# Patient Record
Sex: Female | Born: 1950 | Race: Black or African American | Hispanic: No | Marital: Married | Smoking: Never smoker
Health system: Southern US, Community
[De-identification: ages and names within clinical notes are randomized; demographics above are authoritative.]

## PROBLEM LIST (undated history)

## (undated) DIAGNOSIS — N951 Menopausal and female climacteric states: Secondary | ICD-10-CM

## (undated) DIAGNOSIS — I10 Essential (primary) hypertension: Secondary | ICD-10-CM

## (undated) DIAGNOSIS — E119 Type 2 diabetes mellitus without complications: Secondary | ICD-10-CM

## (undated) DIAGNOSIS — M1990 Unspecified osteoarthritis, unspecified site: Secondary | ICD-10-CM

## (undated) DIAGNOSIS — E785 Hyperlipidemia, unspecified: Secondary | ICD-10-CM

## (undated) DIAGNOSIS — J302 Other seasonal allergic rhinitis: Secondary | ICD-10-CM

## (undated) DIAGNOSIS — E039 Hypothyroidism, unspecified: Secondary | ICD-10-CM

## (undated) DIAGNOSIS — C44111 Basal cell carcinoma of skin of unspecified eyelid, including canthus: Secondary | ICD-10-CM

## (undated) DIAGNOSIS — N879 Dysplasia of cervix uteri, unspecified: Secondary | ICD-10-CM

## (undated) DIAGNOSIS — D563 Thalassemia minor: Secondary | ICD-10-CM

## (undated) DIAGNOSIS — O039 Complete or unspecified spontaneous abortion without complication: Secondary | ICD-10-CM

## (undated) DIAGNOSIS — M810 Age-related osteoporosis without current pathological fracture: Secondary | ICD-10-CM

## (undated) HISTORY — DX: Type 2 diabetes mellitus without complications: E11.9

## (undated) HISTORY — PX: APPENDECTOMY: SHX54

## (undated) HISTORY — DX: Hyperlipidemia, unspecified: E78.5

## (undated) HISTORY — DX: Menopausal and female climacteric states: N95.1

## (undated) HISTORY — DX: Hypothyroidism, unspecified: E03.9

## (undated) HISTORY — PX: CHOLECYSTECTOMY: SHX55

## (undated) HISTORY — PX: TONSILLECTOMY: SHX5217

## (undated) HISTORY — PX: ABDOMINAL HYSTERECTOMY: SHX81

## (undated) HISTORY — DX: Essential (primary) hypertension: I10

## (undated) HISTORY — DX: Other seasonal allergic rhinitis: J30.2

## (undated) HISTORY — DX: Unspecified osteoarthritis, unspecified site: M19.90

## (undated) HISTORY — PX: OTHER SURGICAL HISTORY: SHX170

## (undated) HISTORY — DX: Thalassemia minor: D56.3

## (undated) HISTORY — PX: HX HYSTERECTOMY: SHX81

## (undated) HISTORY — PX: HX COLPOSCOPY: SHX161

## (undated) HISTORY — PX: HX BREAST BIOPSY: SHX20

## (undated) HISTORY — DX: Basal cell carcinoma of skin of unspecified eyelid, including canthus: C44.111

## (undated) HISTORY — PX: HX CRYOSURGERY OF CERVIX: 2100001332

## (undated) HISTORY — DX: Complete or unspecified spontaneous abortion without complication: O03.9

## (undated) HISTORY — DX: Dysplasia of cervix uteri, unspecified: N87.9

## (undated) HISTORY — PX: BASAL CELL CARCINOMA EXCISION: SHX1214

## (undated) HISTORY — DX: Age-related osteoporosis without current pathological fracture: M81.0

---

## 2003-06-25 LAB — HM DEXA SCAN: HM Dexa Scan: NORMAL

## 2008-02-26 ENCOUNTER — Ambulatory Visit (HOSPITAL_COMMUNITY): Payer: Self-pay | Admitting: Medical

## 2012-11-15 HISTORY — PX: COLONOSCOPY: SHX174

## 2013-05-04 LAB — HM COLONOSCOPY

## 2013-09-04 DIAGNOSIS — Z008 Encounter for other general examination: Secondary | ICD-10-CM | POA: Insufficient documentation

## 2013-09-04 HISTORY — DX: Encounter for other general examination: Z00.8

## 2013-09-21 ENCOUNTER — Ambulatory Visit (HOSPITAL_COMMUNITY)
Admission: RE | Admit: 2013-09-21 | Discharge: 2013-09-21 | Disposition: A | Discharge status: Home / Self Care | Payer: Self-pay | Source: Ambulatory Visit

## 2014-09-05 DIAGNOSIS — Z01419 Encounter for gynecological examination (general) (routine) without abnormal findings: Secondary | ICD-10-CM | POA: Insufficient documentation

## 2014-09-05 HISTORY — DX: Encounter for gynecological examination (general) (routine) without abnormal findings: Z01.419

## 2014-11-05 ENCOUNTER — Ambulatory Visit (HOSPITAL_COMMUNITY)
Admission: RE | Admit: 2014-11-05 | Discharge: 2014-11-05 | Disposition: A | Discharge status: Home / Self Care | Payer: Self-pay | Source: Ambulatory Visit

## 2015-09-11 DIAGNOSIS — N907 Vulvar cyst: Secondary | ICD-10-CM | POA: Insufficient documentation

## 2015-09-11 DIAGNOSIS — L720 Epidermal cyst: Secondary | ICD-10-CM | POA: Insufficient documentation

## 2015-09-11 DIAGNOSIS — D239 Other benign neoplasm of skin, unspecified: Secondary | ICD-10-CM | POA: Insufficient documentation

## 2015-09-11 DIAGNOSIS — D229 Melanocytic nevi, unspecified: Secondary | ICD-10-CM | POA: Insufficient documentation

## 2015-11-10 ENCOUNTER — Ambulatory Visit (HOSPITAL_COMMUNITY)
Admission: RE | Admit: 2015-11-10 | Discharge: 2015-11-10 | Disposition: A | Discharge status: Home / Self Care | Payer: Self-pay | Source: Ambulatory Visit

## 2016-12-23 ENCOUNTER — Ambulatory Visit (HOSPITAL_COMMUNITY)
Admission: RE | Admit: 2016-12-23 | Discharge: 2016-12-23 | Disposition: A | Discharge status: Home / Self Care | Payer: Self-pay | Source: Ambulatory Visit

## 2017-11-03 LAB — HM MAMMOGRAPHY

## 2018-01-04 ENCOUNTER — Ambulatory Visit (HOSPITAL_COMMUNITY)
Admission: RE | Admit: 2018-01-04 | Discharge: 2018-01-04 | Disposition: A | Discharge status: Home / Self Care | Payer: Self-pay | Source: Ambulatory Visit

## 2018-10-30 LAB — HM DIABETES EYE EXAM

## 2019-01-30 ENCOUNTER — Ambulatory Visit (HOSPITAL_COMMUNITY)
Admission: RE | Admit: 2019-01-30 | Discharge: 2019-01-30 | Disposition: A | Discharge status: Home / Self Care | Payer: Self-pay | Source: Ambulatory Visit

## 2019-08-09 LAB — BASIC METABOLIC PANEL
BUN: 18 (ref 4–21)
Creatinine: 0.7 (ref 0.5–1.1)

## 2019-08-09 LAB — HEMOGLOBIN A1C: Hemoglobin A1C: 6.1

## 2019-08-09 LAB — TSH: TSH: 3.64 (ref 0.41–5.90)

## 2019-09-12 ENCOUNTER — Other Ambulatory Visit: Payer: Self-pay

## 2019-09-12 ENCOUNTER — Ambulatory Visit: Payer: Medicare Other | Admitting: "Endocrinology

## 2019-09-12 ENCOUNTER — Encounter: Payer: Self-pay | Admitting: "Endocrinology

## 2019-09-12 VITALS — BP 138/79 | HR 88 | Temp 98.1°F | Ht 63.0 in | Wt 183.0 lb

## 2019-09-12 DIAGNOSIS — E1165 Type 2 diabetes mellitus with hyperglycemia: Secondary | ICD-10-CM

## 2019-09-12 MED ORDER — FREESTYLE LIBRE 14 DAY SENSOR MISC: 1.0000 | 2 refills | Status: DC

## 2019-09-12 MED ORDER — LOSARTAN POTASSIUM 25 MG PO TABS: 25.0000 mg | ORAL_TABLET | Freq: Every day | ORAL | 3 refills | Status: DC

## 2019-09-12 MED ORDER — FREESTYLE LIBRE 14 DAY READER DEVI: 1.0000 | Freq: Once | 0 refills | Status: AC

## 2019-09-12 NOTE — Progress Notes (Signed)
Endocrinology Consult Note       09/12/2019, 5:08 PM   Subjective:    Patient ID: Courtney Barry, female    DOB: 08/03/1951.  Courtney Barry is being seen in consultation for management of currently uncontrolled symptomatic diabetes requested by  Harland Dingwall L, NP-C.   Past Medical History:  Diagnosis Date  . Diabetes mellitus, type II ([place])   . Hyperlipidemia   . Hypothyroidism     Past Surgical History:  Procedure Laterality Date  . ABDOMINAL HYSTERECTOMY    . APPENDECTOMY    . CHOLECYSTECTOMY    . TONSILLECTOMY      Social History   Socioeconomic History  . Marital status: Married    Spouse name: Not on file  . Number of children: Not on file  . Years of education: Not on file  . Highest education level: Not on file  Occupational History  . Not on file  Social Needs  . Financial resource strain: Not on file  . Food insecurity    Worry: Not on file    Inability: Not on file  . Transportation needs    Medical: Not on file    Non-medical: Not on file  Tobacco Use  . Smoking status: Never Smoker  . Smokeless tobacco: Never Used  Substance and Sexual Activity  . Alcohol use: Never    Frequency: Never  . Drug use: Never  . Sexual activity: Not on file  Lifestyle  . Physical activity    Days per week: Not on file    Minutes per session: Not on file  . Stress: Not on file  Relationships  . Social Herbalist on phone: Not on file    Gets together: Not on file    Attends religious service: Not on file    Active member of club or organization: Not on file    Attends meetings of clubs or organizations: Not on file    Relationship status: Not on file  Other Topics Concern  . Not on file  Social History Narrative  . Not on file    Family History  Problem Relation Age of Onset  . Thyroid disease Mother   . Cancer Mother   . Hypertension Father   . Kidney disease  Father     Outpatient Encounter Medications as of 09/12/2019  Medication Sig  . atorvastatin (LIPITOR) 40 MG tablet Take 40 mg by mouth daily.  . insulin degludec (TRESIBA FLEXTOUCH) 100 UNIT/ML SOPN FlexTouch Pen Inject 25 Units into the skin at bedtime.  . insulin lispro (HUMALOG KWIKPEN) 100 UNIT/ML KwikPen Inject 5-11 Units into the skin 3 (three) times daily before meals.  Marland Kitchen levothyroxine (SYNTHROID) 112 MCG tablet Take 112 mcg by mouth daily before breakfast.  . loratadine (CLARITIN) 10 MG tablet Take 10 mg by mouth daily as needed for allergies.  . pioglitazone (ACTOS) 15 MG tablet Take 15 mg by mouth daily.  . Continuous Blood Gluc Receiver (FREESTYLE LIBRE 14 DAY READER) DEVI 1 each by Does not apply route once for 1 dose.  . Continuous Blood Gluc Sensor (FREESTYLE LIBRE 14 DAY SENSOR)  MISC Inject 1 each into the skin every 14 (fourteen) days. Use as directed.  Marland Kitchen losartan (COZAAR) 25 MG tablet Take 1 tablet (25 mg total) by mouth daily.   No facility-administered encounter medications on file as of 09/12/2019.     ALLERGIES: Allergies  Allergen Reactions  . Sulfa Antibiotics Other (See Comments)    VACCINATION STATUS:  There is no immunization history on file for this patient.  Diabetes She presents for her initial diabetic visit. She has type 2 diabetes mellitus. Onset time: She was diagnosed at approximate age of 7 years. Her disease course has been improving. There are no hypoglycemic associated symptoms. Pertinent negatives for hypoglycemia include no confusion, headaches, pallor or seizures. There are no diabetic associated symptoms. Pertinent negatives for diabetes include no chest pain, no polydipsia, no polyphagia and no polyuria. There are no hypoglycemic complications. Symptoms are improving. Diabetic complications include retinopathy. Risk factors for coronary artery disease include diabetes mellitus, dyslipidemia, hypertension, post-menopausal, sedentary lifestyle  and obesity. Current diabetic treatments: She is on Tresiba 25 units every morning, Humalog 8 units 3 times daily AC, pioglitazone 15 mg every other day. She is compliant with treatment most of the time. Her weight is fluctuating minimally. She is following a generally unhealthy diet. When asked about meal planning, she reported none. She has not had a previous visit with a dietitian. She participates in exercise intermittently. Her home blood glucose trend is decreasing steadily. (Her A1c on August 09, 2019 was 6.1% consistent with controlled profile.) An ACE inhibitor/angiotensin II receptor blocker is not being taken. Eye exam is current.  Hyperlipidemia This is a chronic problem. The current episode started more than 1 year ago. Exacerbating diseases include diabetes. Pertinent negatives include no chest pain, myalgias or shortness of breath. Current antihyperlipidemic treatment includes statins. Risk factors for coronary artery disease include diabetes mellitus, dyslipidemia, hypertension, a sedentary lifestyle and post-menopausal.  Thyroid Problem Presents for initial visit. Onset time: She was diagnosed with hypothyroidism in her 34s, currently taking Synthroid 112 mcg p.o. daily. Patient reports no cold intolerance, diarrhea, heat intolerance or palpitations. Her past medical history is significant for diabetes and hyperlipidemia.  Hypertension This is a chronic problem. Pertinent negatives include no chest pain, headaches, palpitations or shortness of breath. Risk factors for coronary artery disease include diabetes mellitus, dyslipidemia, family history, post-menopausal state and sedentary lifestyle. Hypertensive end-organ damage includes retinopathy. Identifiable causes of hypertension include a thyroid problem.     Review of Systems  Constitutional: Negative for chills, fever and unexpected weight change.  HENT: Negative for trouble swallowing and voice change.   Eyes: Negative for visual  disturbance.  Respiratory: Negative for cough, shortness of breath and wheezing.   Cardiovascular: Negative for chest pain, palpitations and leg swelling.  Gastrointestinal: Negative for diarrhea, nausea and vomiting.  Endocrine: Negative for cold intolerance, heat intolerance, polydipsia, polyphagia and polyuria.  Musculoskeletal: Negative for arthralgias and myalgias.  Skin: Negative for color change, pallor, rash and wound.  Neurological: Negative for seizures and headaches.  Psychiatric/Behavioral: Negative for confusion and suicidal ideas.    Objective:    BP 138/79   Pulse 88   Temp 98.1 F (36.7 C)   Wt 183 lb (83 kg)   Wt Readings from Last 3 Encounters:  09/12/19 183 lb (83 kg)     Physical Exam Constitutional:      Appearance: She is well-developed.  HENT:     Head: Normocephalic and atraumatic.  Neck:     Musculoskeletal:  Normal range of motion and neck supple.     Thyroid: No thyromegaly.     Trachea: No tracheal deviation.  Pulmonary:     Effort: Pulmonary effort is normal.  Abdominal:     Tenderness: There is no abdominal tenderness. There is no guarding.  Musculoskeletal: Normal range of motion.  Skin:    General: Skin is warm and dry.     Coloration: Skin is not pale.     Findings: No erythema or rash.  Neurological:     Mental Status: She is alert and oriented to person, place, and time.     Cranial Nerves: No cranial nerve deficit.     Coordination: Coordination normal.     Deep Tendon Reflexes: Reflexes are normal and symmetric.  Psychiatric:        Judgment: Judgment normal.      Recent Results (from the past 2160 hour(s))  Basic metabolic panel     Status: None   Collection Time: 08/09/19 12:00 AM  Result Value Ref Range   BUN 18 4 - 21   Creatinine 0.7 0.5 - 1.1  Hemoglobin A1c     Status: None   Collection Time: 08/09/19 12:00 AM  Result Value Ref Range   Hemoglobin A1C 6.1   TSH     Status: None   Collection Time: 08/09/19 12:00 AM   Result Value Ref Range   TSH 3.64 0.41 - 5.90    Comment:  ft4 ii 1.35      Assessment & Plan:   1. Uncontrolled type 2 diabetes mellitus with hyperglycemia ([place])  - Simon Kuenzel has currently controlled asymptomatic type 2 DM since  68 years of age,  with most recent A1c of 6.1 %. Recent labs reviewed. - I had a long discussion with her about the progressive nature of diabetes and the pathology behind its complications. -her diabetes is complicated by retinopathy obesity/sedentary life and she remains at a high risk for more acute and chronic complications which include CAD, CVA, CKD, retinopathy, and neuropathy. These are all discussed in detail with her.  - I have counseled her on diet  and weight management  by adopting a carbohydrate restricted/protein rich diet. Patient is encouraged to switch to  unprocessed or minimally processed     complex starch and increased protein intake (animal or plant source), fruits, and vegetables. -  she is advised to stick to a routine mealtimes to eat 3 meals  a day and avoid unnecessary snacks ( to snack only to correct hypoglycemia).   - she admits that there is a room for improvement in her food and drink choices. - Suggestion is made for her to avoid simple carbohydrates  from her diet including Cakes, Sweet Desserts, Ice Cream, Soda (diet and regular), Sweet Tea, Candies, Chips, Cookies, Store Bought Juices, Alcohol in Excess of  1-2 drinks a day, Artificial Sweeteners,  Coffee Creamer, and "Sugar-free" Products. This will help patient to have more stable blood glucose profile and potentially avoid unintended weight gain.  - she will be scheduled with Jearld Fenton, RDN, CDE for diabetes education.  - I have approached her with the following individualized plan to manage  her diabetes and patient agrees:   -She will benefit from simplified treatment regimen given her tight control of diabetes with A1c of 6.1%. -I advised her to switch her  Tyler Aas to bedtime 25 units, lower her Humalog to 5  units 3 times a day with meals  for pre-meal BG  readings of 90-150mg /dl, plus patient specific correction dose for unexpected hyperglycemia above 150mg /dl, associated with strict monitoring of glucose 4 times a day-before meals and at bedtime. - she is warned not to take insulin without proper monitoring per orders. - Adjustment parameters are given to her for hypo and hyperglycemia in writing. - she is encouraged to call clinic for blood glucose levels less than 70 or above 300 mg /dl. - she will benefit from a CGM - I discussed and prescribed the freestyle libre device for her -She has a chance to come off of Humalog, promises to do better on her diet. - she is advised to continue albuterol 15 mg po every  other day therapeutically suitable for patient . -She reports intolerance to Metformin, may benefit from low-dose glipizide, if necessary. - she will be considered for incretin therapy as appropriate next visit.  - Specific targets for  A1c;  LDL, HDL, Triglycerides, and  Waist Circumference were discussed with the patient.  2) Blood Pressure /Hypertension:  her blood pressure is  controlled to target.  I discussed initiated losartan 25 mg p.o. daily at breakfast.  3) Lipids/Hyperlipidemia: No recent lipid panel to review.  She is currently on atorvastatin 40 mg p.o. nightly.  She is advised to continue.   -  Side effects and precautions discussed with her.  4) hypothyroidism-longstanding diagnosis since at least from her 23s GERD The circumstances of her diagnosis are not available to review.  Patient denies any exposure to I-131 nor thyroidectomy. -Currently on Synthroid 112 mcg p.o. daily before breakfast.  - We discussed about the correct intake of her thyroid hormone, on empty stomach at fasting, with water, separated by at least 30 minutes from breakfast and other medications,  and separated by more than 4 hours from calcium, iron,  multivitamins, acid reflux medications (PPIs). -Patient is made aware of the fact that thyroid hormone replacement is needed for life, dose to be adjusted by periodic monitoring of thyroid function tests.   5)  Weight/Diet: Her BMI is 32- clearly complicating her diabetes care.   she is  a candidate for weight loss. I discussed with her the fact that loss of 5 - 10% of her  current body weight will have the most impact on her diabetes management.  Exercise, and detailed carbohydrates information provided  -  detailed on discharge instructions.  6) Chronic Care/Health Maintenance:  -she  is on ACEI/ARB and Statin medications and  is encouraged to initiate and continue to follow up with Ophthalmology, Dentist,  Podiatrist at least yearly or according to recommendations, and advised to  stay away from smoking. I have recommended yearly flu vaccine and pneumonia vaccine at least every 5 years; moderate intensity exercise for up to 150 minutes weekly; and  sleep for at least 7 hours a day.  - she is  advised to maintain close follow up with Girtha Rm, NP-C for primary care needs, as well as her other providers for optimal and coordinated care.  - Time spent with the patient: 60 minutes, of which >50% was spent in obtaining information about her symptoms, reviewing her previous labs/studies, evaluations, and treatments, counseling her about her currently controlled asymptomatic type 2 diabetes, hypothyroidism, hyperlipidemia, hypertension, and developing plans for long term treatment based on the latest standards of care/guidelines.  Please refer to " Patient Self Inventory" in the Media  tab for reviewed elements of pertinent patient history.  Courtney Barry participated in the discussions, expressed understanding, and  voiced agreement with the above plans.  All questions were answered to her satisfaction. she is encouraged to contact clinic should she have any questions or concerns prior to her return  visit.  Follow up plan: - Return in about 10 days (around 09/22/2019), or phone, for Follow up with Meter and Logs Only - no Labs.  Glade Lloyd, MD [facility] [address] [place], [place] [address] Phone: [phone]  Fax: [phone]    09/12/2019, 5:08 PM  This note was partially dictated with voice recognition software. Similar sounding words can be transcribed inadequately or may not  be corrected upon review.

## 2019-09-12 NOTE — Patient Instructions (Signed)

## 2019-09-13 ENCOUNTER — Telehealth: Payer: Self-pay | Admitting: "Endocrinology

## 2019-09-13 NOTE — Telephone Encounter (Signed)
Courtney Barry with [facility] left a VM that the freestyle libre and sensor that was sent to them needs a DX code. She can be reached at [phone]

## 2019-09-14 NOTE — Telephone Encounter (Signed)
E11.65 is the code

## 2019-09-17 ENCOUNTER — Telehealth: Payer: Self-pay | Admitting: "Endocrinology

## 2019-09-17 NOTE — Telephone Encounter (Signed)
Pt. Notified.

## 2019-09-17 NOTE — Telephone Encounter (Signed)
Pt notified that she can skip the morning insulin since she will not eat but that the lunch reading will be a little higher than usual

## 2019-09-17 NOTE — Telephone Encounter (Signed)
Pt said she has a fasting physical with her PMD on Wednesday at 10 am. She wants to know is it okay to take her insulin without eating, because she normally takes it when she eats breakfast.

## 2019-09-19 ENCOUNTER — Encounter: Payer: Self-pay | Admitting: Family Medicine

## 2019-09-19 ENCOUNTER — Other Ambulatory Visit: Payer: Self-pay

## 2019-09-19 ENCOUNTER — Ambulatory Visit: Payer: Medicare Other | Admitting: Family Medicine

## 2019-09-19 VITALS — BP 124/62 | HR 84 | Temp 98.0°F | Ht 65.0 in | Wt 182.8 lb

## 2019-09-19 DIAGNOSIS — I152 Hypertension secondary to endocrine disorders: Secondary | ICD-10-CM

## 2019-09-19 DIAGNOSIS — E785 Hyperlipidemia, unspecified: Secondary | ICD-10-CM

## 2019-09-19 DIAGNOSIS — E1169 Type 2 diabetes mellitus with other specified complication: Secondary | ICD-10-CM

## 2019-09-19 DIAGNOSIS — E119 Type 2 diabetes mellitus without complications: Secondary | ICD-10-CM | POA: Diagnosis not present

## 2019-09-19 DIAGNOSIS — Z841 Family history of disorders of kidney and ureter: Secondary | ICD-10-CM

## 2019-09-19 DIAGNOSIS — N951 Menopausal and female climacteric states: Secondary | ICD-10-CM

## 2019-09-19 DIAGNOSIS — J302 Other seasonal allergic rhinitis: Secondary | ICD-10-CM | POA: Diagnosis not present

## 2019-09-19 DIAGNOSIS — E039 Hypothyroidism, unspecified: Secondary | ICD-10-CM

## 2019-09-19 NOTE — Progress Notes (Signed)
Subjective:    Patient ID: Courtney Barry, female    DOB: [birth year], 68 y.o.   MRN: [id number]  HPI Chief Complaint  Patient presents with  . new pt    new pt, get established , seeing endo diabetes. a1c 6.1 on 08/09/19, hyperthyroid    She is new to the practice and here to establish care. Moved here from [place] with her husband in June 2020 to be closer to her son and grandchildren. Previous medical care: in [place] at Dr. Sherral Hammers, endocrinologist.  She also had a PCP and OB/GYN there. She would like to establish with a gynecologist here.  She has already established with endocrinology. Has been using topical estradiol.  Last CPE: January 2020  Other providers: Dr. Dorris Fetch- endocrinologist  OB/GYN- in [place]   States her father and sister both died of kidney disease. States her sister had congential kidney disease and was on dialysis at the end. Sister had a kidney transplant.  States her father had the same kidney disease but this did not affect him until his late 31s or early 37s and he passed away in his mid 19s.  States she has never seen a nephrologist or had an ultrasound of her kidneys.  She is concerned that she may have the same condition.   DM type 2 - managed by Dr. Dorris Fetch. States she was diagnosed 16 years ago. Started on insulin September 2019.  Last Hgb A1c 6.1% and well controlled.  She had side effects from Metformin so she was taken off of this.  Taking Actos every other day. States they are weaning her off of this.   Hypothyroidism is also managed by Dr. Dorris Fetch.   HTN- started on medication in the past couple of weeks.  States her blood pressures were always fine until a few months ago.  She did bring in readings from home and blood pressures have been elevated since the summer.  States Dr. Dorris Fetch started her on losartan and she is doing fine with this.  HL-states she is unsure what her lipids have been in the past but she has been on atorvastatin 40 mg because of her  diabetes mainly.  States her cholesterol has not been checked in months and she would like to have this done today.  She denies any arthralgias or myalgias or any other side effects from the atorvastatin.  Seasonal allergies- spring and fall.  Takes Claritin as needed  Reports a history of trigger finger on her right hand.  States she has been to a hand specialist and has had several injections.  States this is still bothersome.  States she has been wearing a glove at home and that is helping.   Reports chronic issue of left ankle pain and swelling as well as left knee pain.  States she saw a foot specialist in [place]. Wearing compression stockings, special orthotics in her shoes. States she was prescribed a compound topical medication that she uses on her ankle and knee and this helps.  LMP: 2013. Total hysterectomy  Normal DEXA in the past  Mammogram due in December.   Gained 20 lbs since March with insulin.   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 [employer].   Denies smoking, drinking alcohol, drug use  Diet: Reports a fairly healthy diet Excerise: walks for exercise but decreased activity over the past few months due to ankle and knee pain.  Immunizations: States she  received her flu shot in September 2020  Health maintenance:  Mammogram: due in December per patient Colonoscopy:  Approximately 10 years ago   Reviewed allergies, medications, past medical, surgical, family, and social history.    Review of Systems Pertinent positives and negatives in the history of present illness.     Objective:   Physical Exam BP 124/62   Pulse 84   Temp 98 F (36.7 C)   Ht 5\' 5"  (1.651 m)   Wt 182 lb 12.8 oz (82.9 kg)   BMI 30.42 kg/m   Alert and in no distress. . Cardiac exam shows a regular sinus rhythm without murmurs or gallops. Lungs are clear to auscultation.       Assessment & Plan:   Diabetes mellitus without complication ([place]) - managed by Dr. Dorris Fetch.   Hyperlipidemia associated with type 2 diabetes mellitus ([place]) - Plan: Lipid panel -continue on statin. States she needs lipids checked, has been months since last one. Follow up pending lipid panel.   Hypertension due to endocrine disorder- started on losartan recently. BP normal today. Counseling on low sodium diet   Seasonal allergies- takes Claritin as needed   Family history of kidney disease in sister - Plan: Ambulatory referral to Nephrology - I do not have medical records from her previous PCP nor do I have lab results.  We discussed checking renal function today and either sending her for renal ultrasound or referral to nephrology and she would prefer to see a nephrologist.  Family history of kidney disease in father - Plan: Ambulatory referral to Nephrology -See above note  Hypothyroidism, unspecified type- managed by Dr. Dorris Fetch  Menopausal vaginal dryness - Plan: Ambulatory referral to Gynecology -continue on topical estradiol

## 2019-09-19 NOTE — Patient Instructions (Addendum)
It was a pleasure meeting you today.   Try to increase your physical activity as discussed. It is recommended that you get at least 150 minutes of some type of physical activity per week.    You will hear from [facility] in the next few days.  You should also hear from [facility] but this may take a couple of weeks.    Here is a list of Eye Doctors that you call and schedule an appointment with.   [facility] Address: [place], [place], [place] [address] Phone: [phone]   [facility] Address: [address] [place], [place], [place] [address] Phone: [phone]  Dr. Katy Fitch Address: [address] [place], [place] [address] Phone: [phone]  [facility] [address] [place], [place]  [address] Telephone: [phone]

## 2019-09-20 LAB — LIPID PANEL
Chol/HDL Ratio: 2.4 ratio (ref 0.0–4.4)
Cholesterol, Total: 107 mg/dL (ref 100–199)
HDL: 45 mg/dL (ref >39)
LDL Chol Calc (NIH): 50 mg/dL (ref 0–99)
Triglycerides: 49 mg/dL (ref 0–149)
VLDL Cholesterol Cal: 12 mg/dL (ref 5–40)

## 2019-09-24 ENCOUNTER — Encounter: Payer: Self-pay | Admitting: "Endocrinology

## 2019-09-24 ENCOUNTER — Other Ambulatory Visit: Payer: Self-pay

## 2019-09-24 ENCOUNTER — Ambulatory Visit (INDEPENDENT_AMBULATORY_CARE_PROVIDER_SITE_OTHER): Payer: Medicare Other | Admitting: "Endocrinology

## 2019-09-24 DIAGNOSIS — E039 Hypothyroidism, unspecified: Secondary | ICD-10-CM

## 2019-09-24 DIAGNOSIS — E1165 Type 2 diabetes mellitus with hyperglycemia: Secondary | ICD-10-CM | POA: Diagnosis not present

## 2019-09-24 DIAGNOSIS — I10 Essential (primary) hypertension: Secondary | ICD-10-CM

## 2019-09-24 DIAGNOSIS — E782 Mixed hyperlipidemia: Secondary | ICD-10-CM

## 2019-09-24 MED ORDER — TRESIBA FLEXTOUCH 100 UNIT/ML ~~LOC~~ SOPN: 25.0000 [IU] | PEN_INJECTOR | Freq: Every day | SUBCUTANEOUS | 2 refills | Status: DC

## 2019-09-24 MED ORDER — LEVOTHYROXINE SODIUM 112 MCG PO TABS: 112.0000 ug | ORAL_TABLET | Freq: Every day | ORAL | 3 refills | Status: DC

## 2019-09-24 MED ORDER — INSULIN LISPRO (1 UNIT DIAL) 100 UNIT/ML (KWIKPEN): 5.0000 [IU] | PEN_INJECTOR | Freq: Three times a day (TID) | SUBCUTANEOUS | 2 refills | Status: DC

## 2019-09-24 NOTE — Progress Notes (Signed)
09/24/2019, 4:42 PM                                                    Endocrinology Telehealth Visit Follow up Note -During COVID -19 Pandemic  This visit type was conducted due to national recommendations for restrictions regarding the COVID-19 Pandemic  in an effort to limit this patient's exposure and mitigate transmission of the corona virus.  Due to her co-morbid illnesses, Courtney Barry is at  moderate to high risk for complications without adequate follow up.  This format is felt to be most appropriate for her at this time.  I connected with this patient on 09/24/2019   by telephone and verified that I am speaking with the correct person using two identifiers. Courtney Barry, 04-09-1951. she has verbally consented to this visit. All issues noted in this document were discussed and addressed. The format was not optimal for physical exam.    Subjective:    Patient ID: Courtney Barry, female    DOB: 07/19/1951.  Courtney Barry is being engaged in telehealth via telephone after she was seen in consultation for management of currently uncontrolled symptomatic diabetes requested by  Harland Dingwall L, NP-C.   Past Medical History:  Diagnosis Date  . Diabetes mellitus, type II ([place])   . Hyperlipidemia   . Hypothyroidism   . Menopausal vaginal dryness     Past Surgical History:  Procedure Laterality Date  . ABDOMINAL HYSTERECTOMY    . APPENDECTOMY    . CHOLECYSTECTOMY    . TONSILLECTOMY      Social History   Socioeconomic History  . Marital status: Married    Spouse name: Not on file  . Number of children: Not on file  . Years of education: Not on file  . Highest education level: Not on file  Occupational History  . Not on file  Social Needs  . Financial resource strain: Not on file  . Food insecurity    Worry: Not on file    Inability: Not on file  . Transportation needs    Medical: Not on file     Non-medical: Not on file  Tobacco Use  . Smoking status: Never Smoker  . Smokeless tobacco: Never Used  Substance and Sexual Activity  . Alcohol use: Never    Frequency: Never  . Drug use: Never  . Sexual activity: Not on file  Lifestyle  . Physical activity    Days per week: Not on file    Minutes per session: Not on file  . Stress: Not on file  Relationships  . Social Herbalist on phone: Not on file    Gets together: Not on file    Attends religious service: Not on file    Active member of club or organization: Not on file    Attends meetings of clubs or organizations: Not on file    Relationship status: Not on file  Other Topics Concern  . Not on file  Social History Narrative  . Not on file    Family History  Problem Relation Age of Onset  . Thyroid disease Mother   . Cancer Mother   . Pancreatic cancer Mother   . Hypertension Father   . Kidney disease Father   . Thyroid cancer Sister        died at 62    Outpatient Encounter Medications as of 09/24/2019  Medication Sig  . Ascorbic Acid (VITAMIN C PO) Take by mouth.  Marland Kitchen atorvastatin (LIPITOR) 40 MG tablet Take 40 mg by mouth daily.  . Continuous Blood Gluc Sensor (FREESTYLE LIBRE 14 DAY SENSOR) MISC Inject 1 each into the skin every 14 (fourteen) days. Use as directed.  . insulin degludec (TRESIBA FLEXTOUCH) 100 UNIT/ML SOPN FlexTouch Pen Inject 0.25 mLs (25 Units total) into the skin at bedtime.  . insulin lispro (HUMALOG KWIKPEN) 100 UNIT/ML KwikPen Inject 0.05-0.11 mLs (5-11 Units total) into the skin 3 (three) times daily before meals.  Marland Kitchen levothyroxine (SYNTHROID) 112 MCG tablet Take 1 tablet (112 mcg total) by mouth daily before breakfast.  . loratadine (CLARITIN) 10 MG tablet Take 10 mg by mouth daily as needed for allergies.  Marland Kitchen losartan (COZAAR) 25 MG tablet Take 1 tablet (25 mg total) by mouth daily.  . pioglitazone (ACTOS) 15 MG tablet Take 15 mg by mouth daily.  . Polyvinyl Alcohol-Povidone  (REFRESH OP) Apply to eye.  . [DISCONTINUED] insulin degludec (TRESIBA FLEXTOUCH) 100 UNIT/ML SOPN FlexTouch Pen Inject 25 Units into the skin at bedtime.  . [DISCONTINUED] insulin lispro (HUMALOG KWIKPEN) 100 UNIT/ML KwikPen Inject 5-11 Units into the skin 3 (three) times daily before meals.  . [DISCONTINUED] levothyroxine (SYNTHROID) 112 MCG tablet Take 112 mcg by mouth daily before breakfast.   No facility-administered encounter medications on file as of 09/24/2019.     ALLERGIES: Allergies  Allergen Reactions  . Sulfa Antibiotics Other (See Comments)    VACCINATION STATUS: Immunization History  Administered Date(s) Administered  . Influenza-Unspecified 08/09/2019    Diabetes She presents for her follow-up diabetic visit. She has type 2 diabetes mellitus. Onset time: She was diagnosed at approximate age of 6 years. Her disease course has been improving. There are no hypoglycemic associated symptoms. Pertinent negatives for hypoglycemia include no confusion, headaches, pallor or seizures. There are no diabetic associated symptoms. Pertinent negatives for diabetes include no chest pain, no polydipsia, no polyphagia and no polyuria. There are no hypoglycemic complications. Symptoms are improving. Diabetic complications include retinopathy. Risk factors for coronary artery disease include diabetes mellitus, dyslipidemia, hypertension, post-menopausal, sedentary lifestyle and obesity. Current diabetic treatments: She is on Tresiba 25 units every morning, Humalog 8 units 3 times daily AC, pioglitazone 15 mg every other day. She is compliant with treatment most of the time. Her weight is fluctuating minimally. She is following a generally unhealthy diet. When asked about meal planning, she reported none. She has not had a previous visit with a dietitian. She participates in exercise intermittently. Her home blood glucose trend is decreasing steadily. Her breakfast blood glucose range is generally  110-130 mg/dl. Her lunch blood glucose range is generally 110-130 mg/dl. Her dinner blood glucose range is generally 110-130 mg/dl. Her bedtime blood glucose range is generally 130-140 mg/dl. Her overall blood glucose range is 110-130 mg/dl. (Her A1c on August 09, 2019 was 6.1% consistent with controlled profile.) An ACE inhibitor/angiotensin II receptor blocker is not being taken. Eye exam is current.  Hyperlipidemia This is a chronic problem. The current episode started  more than 1 year ago. Exacerbating diseases include diabetes. Pertinent negatives include no chest pain, myalgias or shortness of breath. Current antihyperlipidemic treatment includes statins. Risk factors for coronary artery disease include diabetes mellitus, dyslipidemia, hypertension, a sedentary lifestyle and post-menopausal.  Thyroid Problem Presents for initial visit. Onset time: She was diagnosed with hypothyroidism in her 17s, currently taking Synthroid 112 mcg p.o. daily. Patient reports no cold intolerance, diarrhea, heat intolerance or palpitations. Her past medical history is significant for diabetes and hyperlipidemia.  Hypertension This is a chronic problem. Pertinent negatives include no chest pain, headaches, palpitations or shortness of breath. Risk factors for coronary artery disease include diabetes mellitus, dyslipidemia, family history, post-menopausal state and sedentary lifestyle. Hypertensive end-organ damage includes retinopathy. Identifiable causes of hypertension include a thyroid problem.   Review of systems: Limited as above.    Objective:    There were no vitals taken for this visit.  Wt Readings from Last 3 Encounters:  09/19/19 182 lb 12.8 oz (82.9 kg)  09/12/19 183 lb (83 kg)       Recent Results (from the past 2160 hour(s))  Basic metabolic panel     Status: None   Collection Time: 08/09/19 12:00 AM  Result Value Ref Range   BUN 18 4 - 21   Creatinine 0.7 0.5 - 1.1  Hemoglobin A1c      Status: None   Collection Time: 08/09/19 12:00 AM  Result Value Ref Range   Hemoglobin A1C 6.1   TSH     Status: None   Collection Time: 08/09/19 12:00 AM  Result Value Ref Range   TSH 3.64 0.41 - 5.90    Comment:  ft4 ii 1.35  Lipid panel     Status: None   Collection Time: 09/19/19 11:33 AM  Result Value Ref Range   Cholesterol, Total 107 100 - 199 mg/dL   Triglycerides 49 0 - 149 mg/dL   HDL 45 >39 mg/dL   VLDL Cholesterol Cal 12 5 - 40 mg/dL   LDL Chol Calc (NIH) 50 0 - 99 mg/dL   Chol/HDL Ratio 2.4 0.0 - 4.4 ratio    Comment:                                   T. Chol/HDL Ratio                                             Men  Women                               1/2 Avg.Risk  3.4    3.3                                   Avg.Risk  5.0    4.4                                2X Avg.Risk  9.6    7.1  3X Avg.Risk 23.4   11.0       Assessment & Plan:   1. Uncontrolled type 2 diabetes mellitus with hyperglycemia ([place])  - Marbella Digenova has currently controlled asymptomatic type 2 DM since  68 years of age,  with most recent A1c of 6.1 %. Recent labs reviewed. -She is reporting 100% on target glycemic profile both fasting and postprandial. - I had a long discussion with her about the progressive nature of diabetes and the pathology behind its complications. -her diabetes is complicated by retinopathy obesity/sedentary life and she remains at a high risk for more acute and chronic complications which include CAD, CVA, CKD, retinopathy, and neuropathy. These are all discussed in detail with her.  - I have counseled her on diet  and weight management  by adopting a carbohydrate restricted/protein rich diet. Patient is encouraged to switch to  unprocessed or minimally processed     complex starch and increased protein intake (animal or plant source), fruits, and vegetables. -  she is advised to stick to a routine mealtimes to eat 3 meals  a day and avoid  unnecessary snacks ( to snack only to correct hypoglycemia).   - she  admits there is a room for improvement in her diet and drink choices. -  Suggestion is made for her to avoid simple carbohydrates  from her diet including Cakes, Sweet Desserts / Pastries, Ice Cream, Soda (diet and regular), Sweet Tea, Candies, Chips, Cookies, Sweet Pastries,  Store Bought Juices, Alcohol in Excess of  1-2 drinks a day, Artificial Sweeteners, Coffee Creamer, and "Sugar-free" Products. This will help patient to have stable blood glucose profile and potentially avoid unintended weight gain.   - she will be scheduled with Jearld Fenton, RDN, CDE for diabetes education.  - I have approached her with the following individualized plan to manage  her diabetes and patient agrees:   -She will benefit from simplified treatment regimen given her tight control of diabetes with A1c of 6.1%. -She is advised to continue  Tresiba 25 units at bedtime, Humalog 5  units 3 times a day with meals  for pre-meal BG readings of 90-150mg /dl, plus patient specific correction dose for unexpected hyperglycemia above 150mg /dl, associated with strict monitoring of glucose 4 times a day-before meals and at bedtime. - she is warned not to take insulin without proper monitoring per orders. - Adjustment parameters are given to her for hypo and hyperglycemia in writing. - she is encouraged to call clinic for blood glucose levels less than 70 or above 300 mg /dl. - she will benefit from a CGM - I discussed and prescribed the freestyle libre device for her -She has a chance to come off of Humalog, promises to do better on her diet. - she is advised to continue albuterol 15 mg po every  other day therapeutically suitable for patient . -She reports intolerance to Metformin, may benefit from low-dose glipizide, if necessary. -She is advised to continue pioglitazone 15 mg p.o. daily with breakfast, will be discontinued after she finishes her current  supplies. - she will be considered for incretin therapy as appropriate next visit.  - Specific targets for  A1c;  LDL, HDL, Triglycerides, and  Waist Circumference were discussed with the patient.  2) Blood Pressure /Hypertension:  her blood pressure is  controlled to target.  I discussed initiated losartan 25 mg p.o. daily at breakfast.  3) Lipids/Hyperlipidemia: No recent lipid panel to review.  She is currently on atorvastatin 40 mg  p.o. nightly.  She is advised to continue.   -  Side effects and precautions discussed with her.  4) hypothyroidism-longstanding diagnosis since at least from her 79s GERD The circumstances of her diagnosis are not available to review.  Patient denies any exposure to I-131 nor thyroidectomy. -Currently on Synthroid 112 mcg p.o. daily before breakfast.  - We discussed about the correct intake of her thyroid hormone, on empty stomach at fasting, with water, separated by at least 30 minutes from breakfast and other medications,  and separated by more than 4 hours from calcium, iron, multivitamins, acid reflux medications (PPIs). -Patient is made aware of the fact that thyroid hormone replacement is needed for life, dose to be adjusted by periodic monitoring of thyroid function tests.  5)  Weight/Diet: Her BMI is 32- clearly complicating her diabetes care.   she is  a candidate for weight loss. I discussed with her the fact that loss of 5 - 10% of her  current body weight will have the most impact on her diabetes management.  Exercise, and detailed carbohydrates information provided  -  detailed on discharge instructions.  6) Chronic Care/Health Maintenance:  -she  is on ACEI/ARB and Statin medications and  is encouraged to initiate and continue to follow up with Ophthalmology, Dentist,  Podiatrist at least yearly or according to recommendations, and advised to  stay away from smoking. I have recommended yearly flu vaccine and pneumonia vaccine at least every 5 years;  moderate intensity exercise for up to 150 minutes weekly; and  sleep for at least 7 hours a day.  - she is  advised to maintain close follow up with Girtha Rm, NP-C for primary care needs, as well as her other providers for optimal and coordinated care.  - Patient Care Time Today:  25 min, of which >50% was spent in  counseling and the rest reviewing her  current and  previous labs/studies, previous treatments, her blood glucose readings, and medications' doses and developing a plan for long-term care based on the latest recommendations for standards of care.   Courtney Barry participated in the discussions, expressed understanding, and voiced agreement with the above plans.  All questions were answered to her satisfaction. she is encouraged to contact clinic should she have any questions or concerns prior to her return visit.   Follow up plan: - Return in about 3 months (around 12/25/2019) for Bring Meter and Logs- A1c in Office, Include 8 log sheets.  Glade Lloyd, MD [facility] [address] [place], [place] [address] Phone: [phone]  Fax: [phone]    09/24/2019, 4:42 PM  This note was partially dictated with voice recognition software. Similar sounding words can be transcribed inadequately or may not  be corrected upon review.

## 2019-09-25 ENCOUNTER — Encounter: Payer: Self-pay | Admitting: Family Medicine

## 2019-09-28 ENCOUNTER — Other Ambulatory Visit: Payer: Self-pay

## 2019-10-01 ENCOUNTER — Ambulatory Visit: Payer: Self-pay | Admitting: Family Medicine

## 2019-10-01 ENCOUNTER — Other Ambulatory Visit: Payer: Self-pay

## 2019-10-01 ENCOUNTER — Ambulatory Visit: Payer: Medicare Other | Admitting: Obstetrics & Gynecology

## 2019-10-01 ENCOUNTER — Encounter: Payer: Self-pay | Admitting: Obstetrics & Gynecology

## 2019-10-01 ENCOUNTER — Other Ambulatory Visit: Payer: Self-pay | Admitting: Obstetrics & Gynecology

## 2019-10-01 VITALS — BP 130/70 | Ht 63.75 in | Wt 179.0 lb

## 2019-10-01 DIAGNOSIS — Z01419 Encounter for gynecological examination (general) (routine) without abnormal findings: Secondary | ICD-10-CM

## 2019-10-01 DIAGNOSIS — Z780 Asymptomatic menopausal state: Secondary | ICD-10-CM

## 2019-10-01 DIAGNOSIS — E6609 Other obesity due to excess calories: Secondary | ICD-10-CM

## 2019-10-01 DIAGNOSIS — Z90710 Acquired absence of both cervix and uterus: Secondary | ICD-10-CM | POA: Diagnosis not present

## 2019-10-01 DIAGNOSIS — Z1231 Encounter for screening mammogram for malignant neoplasm of breast: Secondary | ICD-10-CM

## 2019-10-01 DIAGNOSIS — Z1272 Encounter for screening for malignant neoplasm of vagina: Secondary | ICD-10-CM

## 2019-10-01 DIAGNOSIS — Z90722 Acquired absence of ovaries, bilateral: Secondary | ICD-10-CM

## 2019-10-01 DIAGNOSIS — Z6830 Body mass index (BMI) 30.0-30.9, adult: Secondary | ICD-10-CM

## 2019-10-01 DIAGNOSIS — Z9079 Acquired absence of other genital organ(s): Secondary | ICD-10-CM

## 2019-10-01 MED ORDER — ESTRADIOL 0.1 MG/GM VA CREA: 0.2500 | TOPICAL_CREAM | VAGINAL | 4 refills | Status: DC

## 2019-10-01 NOTE — Progress Notes (Signed)
Courtney Barry Mar 16, 1951 [id number]   History:    68 y.o. G2P2L2 Married.    RP:  New patient presenting for annual gyn exam   HPI: S/P TAH/BSO for Menorrhagia 2013.  No pelvic pain.  Postmenopausal, well on no HRT, except for estradiol cream vaginally for vaginal dryness with intercourse.  Urine and bowel movements normal.  Breasts normal.  Body mass index 30.97.  Walking regularly.  Followed for type 2 diabetes.  Health labs with family physician.  Past medical history,surgical history, family history and social history were all reviewed and documented in the EPIC chart.  Gynecologic History No LMP recorded. Patient has had a hysterectomy. Contraception: status post hysterectomy Last Pap: 08/2018. Results were: normal per patient Last mammogram: 09/2018. Results were: Normal per patient Bone Density: 2018 Normal, will repeat at 5 yrs Colonoscopy: 2014, 10 yr schedule  Obstetric History OB History  Gravida Para Term Preterm AB Living  2 2       2   SAB TAB Ectopic Multiple Live Births               # Outcome Date GA Lbr Len/2nd Weight Sex Delivery Anes PTL Lv  2 Para           1 Para              ROS: A ROS was performed and pertinent positives and negatives are included in the history.  GENERAL: No fevers or chills. HEENT: No change in vision, no earache, sore throat or sinus congestion. NECK: No pain or stiffness. CARDIOVASCULAR: No chest pain or pressure. No palpitations. PULMONARY: No shortness of breath, cough or wheeze. GASTROINTESTINAL: No abdominal pain, nausea, vomiting or diarrhea, melena or bright red blood per rectum. GENITOURINARY: No urinary frequency, urgency, hesitancy or dysuria. MUSCULOSKELETAL: No joint or muscle pain, no back pain, no recent trauma. DERMATOLOGIC: No rash, no itching, no lesions. ENDOCRINE: No polyuria, polydipsia, no heat or cold intolerance. No recent change in weight. HEMATOLOGICAL: No anemia or easy bruising or bleeding. NEUROLOGIC: No  headache, seizures, numbness, tingling or weakness. PSYCHIATRIC: No depression, no loss of interest in normal activity or change in sleep pattern.     Exam:   BP 130/70   Ht 5' 3.75" (1.619 m)   Wt 179 lb (81.2 kg)   BMI 30.97 kg/m   Body mass index is 30.97 kg/m.  General appearance : Well developed well nourished female. No acute distress HEENT: Eyes: no retinal hemorrhage or exudates,  Neck supple, trachea midline, no carotid bruits, no thyroidmegaly Lungs: Clear to auscultation, no rhonchi or wheezes, or rib retractions  Heart: Regular rate and rhythm, no murmurs or gallops Breast:Examined in sitting and supine position were symmetrical in appearance, no palpable masses or tenderness,  no skin retraction, no nipple inversion, no nipple discharge, no skin discoloration, no axillary or supraclavicular lymphadenopathy Abdomen: no palpable masses or tenderness, no rebound or guarding Extremities: no edema or skin discoloration or tenderness  Pelvic: Vulva: Normal             Vagina: No gross lesions or discharge.  Pap reflex done.  Cervix/Uterus absent  Adnexa  Without masses or tenderness  Anus: Normal   Assessment/Plan:  68 y.o. female for annual exam   1. Encounter for Papanicolaou smear of vagina as part of routine gynecological examination Gynecologic exam status post TAH/BSO in menopause.  Pap reflex done on the vaginal vault.  Breast exam normal.  Screening mammogram scheduled for end  of November 2020.  Health labs with family physician.  Followed for type 2 diabetes.  2. S/P TAH-BSO TAH/BSO in 2013 for menorrhagia.  Pathology benign.  3. Postmenopause Well on no hormone replacement therapy.  Will continue to use estradiol cream twice a week for vaginal dryness with intercourse.  No contraindication to continue.  Coconut oil recommended for lubrication with intercourse.  Bone density in 2018 was normal, will repeat at 5 years.  Vitamin D supplements, calcium intake of  1200 mg daily and regular weightbearing physical activity is recommended.  4. Class 1 obesity due to excess calories with serious comorbidity and body mass index (BMI) of 30.0 to 30.9 in adult Lower calorie/carb diet such as [employer] recommended.  Will take into account diabetic diet for type 2 diabetes as well.  Aerobic physical activities 5 times a week and weightlifting every 2 days recommended.  Other orders - estradiol (ESTRACE) 0.1 MG/GM vaginal cream; Place [id number] Applicatorfuls vaginally 2 (two) times a week.  Princess Bruins MD, 8:47 AM 10/01/2019

## 2019-10-01 NOTE — Addendum Note (Signed)
Addended by: Thurnell Garbe A on: 10/01/2019 09:59 AM   Modules accepted: Orders

## 2019-10-01 NOTE — Patient Instructions (Addendum)
  1. Encounter for Papanicolaou smear of vagina as part of routine gynecological examination Gynecologic exam status post TAH/BSO in menopause.  Pap reflex done on the vaginal vault.  Breast exam normal.  Screening mammogram scheduled for end of November 2020.  Health labs with family physician.  Followed for type 2 diabetes.  2. S/P TAH-BSO TAH/BSO in 2013 for menorrhagia.  Pathology benign.  3. Postmenopause Well on no hormone replacement therapy.  Will continue to use estradiol cream twice a week for vaginal dryness with intercourse.  No contraindication to continue.  Coconut oil recommended for lubrication with intercourse.  Bone density in 2018 was normal, will repeat at 5 years.  Vitamin D supplements, calcium intake of 1200 mg daily and regular weightbearing physical activity is recommended.  4. Class 1 obesity due to excess calories with serious comorbidity and body mass index (BMI) of 30.0 to 30.9 in adult Lower calorie/carb diet such as [employer] recommended.  Will take into account diabetic diet for type 2 diabetes as well.  Aerobic physical activities 5 times a week and weightlifting every 2 days recommended.  Other orders - estradiol (ESTRACE) 0.1 MG/GM vaginal cream; Place [id number] Applicatorfuls vaginally 2 (two) times a week.   Shenice, it was a pleasure meeting you today!  I will inform you of your results as soon as they are available.

## 2019-10-02 LAB — PAP IG W/ RFLX HPV ASCU

## 2019-10-03 ENCOUNTER — Ambulatory Visit: Payer: Self-pay | Admitting: Family Medicine

## 2019-10-30 ENCOUNTER — Encounter: Payer: Self-pay | Admitting: Nutrition

## 2019-10-30 ENCOUNTER — Encounter: Payer: Medicare Other | Attending: "Endocrinology | Admitting: Nutrition

## 2019-10-30 ENCOUNTER — Other Ambulatory Visit: Payer: Self-pay

## 2019-10-30 VITALS — Ht 64.0 in | Wt 183.0 lb

## 2019-10-30 DIAGNOSIS — I10 Essential (primary) hypertension: Secondary | ICD-10-CM | POA: Diagnosis present

## 2019-10-30 DIAGNOSIS — E782 Mixed hyperlipidemia: Secondary | ICD-10-CM | POA: Diagnosis present

## 2019-10-30 DIAGNOSIS — E1165 Type 2 diabetes mellitus with hyperglycemia: Secondary | ICD-10-CM | POA: Diagnosis present

## 2019-10-30 NOTE — Progress Notes (Signed)
  Medical Nutrition Therapy:  Appt start time: [id number] end time:  [id number].   Assessment:  Primary concerns today: Diabetes Type 2 x 15 years.. Lives with her husband.. She does the cooking and shopping in the home. Eats 4 meals per day.  Currently takes Humalog 5 units before meals and  25 units of Tresiba at night. Range in morning 106-128  Mg/dl Before lunch 78-115 mg/dl Before dinner 77-150 mg/dl Bedtime  100-140 mg/dl  Lab Results  Component Value Date   HGBA1C 6.1 08/09/2019    Preferred Learning Style:  No preference indicated   Learning Readiness:   Ready  Change in progress   MEDICATIONS:    DIETARY INTAKE:   24-hr recall:  B ( AM): 105 mg/dl  2 ggs, 2 slice ww, bacon, sausage, coffee and 2 equals  Snk ( AM):  L ( PM): 6 chicken nuggets, 1/2 french fries, 1/2 coke  Snk ( PM):  D ( PM): 3 winglettes, green beans, mini corn on cob and water Snk ( PM): 12 corn chips and cheese dips. Beverages: water  Usual physical activity:   Estimated energy needs: 1200 calories 135 g carbohydrates 90 g protein 33  g fat  Progress Towards Goal(s):  In progress.   Nutritional Diagnosis:  NB-1.1 Food and nutrition-related knowledge deficit As related to DIabetes Type 2.  As evidenced by A1C 6.1%  Basal, bolus insulin.    Intervention:    Goals  7000 steps per day Exercise 30 minutes a day Follow My Plate Increase fresh fruits and vegetables Avoid low blood sugars Avoid snacks between meals or bedtime unless BS is less than 100 mg/dl. Eat 2-3 carb choices per meal Call Dr. Dorris Fetch if BS are less than 70- 3 times in a row or week.  Teaching Method Utilized:  Visual Auditory Hands on  Handouts given during visit include:  The Plate Method   Meal Plan Card   Barriers to learning/adherence to lifestyle change: none  Demonstrated degree of understanding via:  Teach Back   Monitoring/Evaluation:  Dietary intake, exercise, , and body weight in 3 month(s).

## 2019-10-30 NOTE — Patient Instructions (Addendum)
Goals  7000 steps per day Exercise 30 minutes a day Follow My Plate Increase fresh fruits and vegetables Avoid low blood sugars Avoid snacks between meals or bedtime unless BS is less than 100 mg/dl. Eat 2-3 carb choices per meal Call Dr. Dorris Fetch if BS are less than 70- 3 times in a row or week.

## 2019-11-01 ENCOUNTER — Encounter: Payer: Self-pay | Admitting: Family Medicine

## 2019-11-01 ENCOUNTER — Other Ambulatory Visit: Payer: Self-pay

## 2019-11-01 ENCOUNTER — Encounter: Payer: Self-pay | Admitting: Nutrition

## 2019-11-01 ENCOUNTER — Ambulatory Visit: Payer: Medicare Other | Admitting: Family Medicine

## 2019-11-01 VITALS — BP 120/58 | HR 85 | Wt 183.2 lb

## 2019-11-01 DIAGNOSIS — M79662 Pain in left lower leg: Secondary | ICD-10-CM | POA: Diagnosis not present

## 2019-11-01 DIAGNOSIS — R6889 Other general symptoms and signs: Secondary | ICD-10-CM | POA: Diagnosis not present

## 2019-11-01 NOTE — Progress Notes (Signed)
   Subjective:    Patient ID: Courtney Barry, female    DOB: 11-Aug-1951, 68 y.o.   MRN: [id number]  HPI Chief Complaint  Patient presents with  . follow-up    follow-up on home health eval. nurse said her ciruculation in left worse than right   [place] was at her house for an assessment. They reported PAD study to be moderately abnormal in her left foot with an 0.87 result and normal in right foot.   Complains of a 6 month history of left lower leg pain that has improved with a topical compound medication that her orthopedist in [place] prescribed for her.  Occasionally her left foot feels like it falls "asleep". This has improved. Occurs once every 2 months or so.  States her ankle has a discoloration, darker than the rest of her leg. This has improved also.   No new concerns.  Denies fever, chills, chest pain, shortness of breath, abdominal pain, nausea, vomiting, diarrhea.   Review of Systems Pertinent positives and negatives in the history of present illness.     Objective:   Physical Exam BP (!) 120/58   Pulse 85   Wt 183 lb 3.2 oz (83.1 kg)   BMI 31.45 kg/m   Left lower extremity with normal temperature, color, capillary refill, sensation to monofilament and pulses.  Range of motion and strength is normal.  No edema.  Calf is nontender and no edema present      Assessment & Plan:  Pain in left lower leg - Plan: VAS [place] [name] WITH/WO TBI, VAS [place] LOWER EXTREMITY ARTERIAL DUPLEX  Abnormal ankle brachial index ([name]) - Plan: VAS [place] [name] WITH/WO TBI, VAS [place] LOWER EXTREMITY ARTERIAL DUPLEX  Review of [facility] assessment showing moderately abnormal [name] in the left lower extremity.  She reports a history of what appears to be musculoskeletal pain that she has been monitored for by her orthopedist in [place].  She has good pulses and sensation in her left foot.  I will order an [name] study to recheck findings.

## 2019-11-12 ENCOUNTER — Other Ambulatory Visit: Payer: Self-pay

## 2019-11-12 ENCOUNTER — Ambulatory Visit (INDEPENDENT_AMBULATORY_CARE_PROVIDER_SITE_OTHER): Payer: Medicare Other

## 2019-11-12 DIAGNOSIS — E1165 Type 2 diabetes mellitus with hyperglycemia: Secondary | ICD-10-CM | POA: Diagnosis not present

## 2019-11-12 NOTE — Progress Notes (Signed)
Educated pt of procedure for using freestyle libre monitor. Sensor applied to upper L arm without difficulty.

## 2019-11-13 ENCOUNTER — Ambulatory Visit (HOSPITAL_COMMUNITY)
Admission: RE | Admit: 2019-11-13 | Discharge: 2019-11-13 | Disposition: A | Discharge status: Home / Self Care | Payer: Medicare Other | Source: Ambulatory Visit | Attending: Internal Medicine | Admitting: Internal Medicine

## 2019-11-13 DIAGNOSIS — R6889 Other general symptoms and signs: Secondary | ICD-10-CM | POA: Diagnosis not present

## 2019-11-13 DIAGNOSIS — M79662 Pain in left lower leg: Secondary | ICD-10-CM | POA: Insufficient documentation

## 2019-11-20 NOTE — Progress Notes (Signed)
Courtney Barry is a 69 y.o. female who presents for annual wellness visit, CPE and follow-up on chronic medical conditions.  She has the following concerns:  I have not yet received medical records from her previous PCP in [place]. We do not have her immunization records.  Anxiety and insomnia due to recent deaths of loved ones recently.  She is talking to her pastor.  States she is taking a short acting antacid anxiety medication in the past and did fine with this.  She is requesting medication today  DM- diabetes managed by Dr. Dorris Fetch. States she is doing very well. Using freesyle Libra and getting used to this.  -Thyroid disorder-doing well on medication.  States endocrinologist will also follow this condition for her.   Immunization History  Administered Date(s) Administered  . Influenza-Unspecified 08/09/2019   Last Pap smear: 2020 Last mammogram: scheduled 11/26/2019 Last colonoscopy:summer 2014 due 2024 Last DEXA: 2 years ago and normal per patient  Dentist: May 2020  Ophtho: 3 weeks ago Exercise: walking  Other doctors caring for patient include: Eye- Dr Katy Fitch Endocrinology- Dr. Dorris Fetch Obgyn- Dr. Dellis Filbert Nutritional- [place] in Endo office  Depression screen:  See questionnaire below.  Depression screen [facility] 2/9 11/21/2019 10/30/2019 09/19/2019  Decreased Interest 0 0 0  Down, Depressed, Hopeless 1 0 0  PHQ - 2 Score 1 0 0    Fall Risk Screen: see questionnaire below. Fall Risk  11/21/2019 10/30/2019 09/19/2019 09/12/2019  Falls in the past year? 0 0 0 0  Number falls in past yr: 0 0 0 -  Injury with Fall? 0 0 0 -  Follow up - - - Falls evaluation completed    ADL screen:  See questionnaire below Functional Status Survey: Is the patient deaf or have difficulty hearing?: No Does the patient have difficulty seeing, even when wearing glasses/contacts?: No Does the patient have difficulty concentrating, remembering, or making decisions?: No Does the patient have difficulty  walking or climbing stairs?: Yes(knee pain) Does the patient have difficulty dressing or bathing?: No Does the patient have difficulty doing errands alone such as visiting a doctor's office or shopping?: No   End of Life Discussion:  Patient does not have a living will and medical power of attorney.  Paperwork was given for living will and healthcare power of attorney.  We discussed and filled out the MOST form together.  Review of Systems Constitutional: -fever, -chills, -sweats, -unexpected weight change, -anorexia, -fatigue Allergy: -sneezing, -itching, -congestion Dermatology: denies changing moles, rash, lumps, new worrisome lesions ENT: -runny nose, -ear pain, -sore throat, -hoarseness, -sinus pain, -teeth pain, -tinnitus, -hearing loss, -epistaxis Cardiology:  -chest pain, -palpitations, -edema, -orthopnea, -paroxysmal nocturnal dyspnea Respiratory: -cough, -shortness of breath, -dyspnea on exertion, -wheezing, -hemoptysis Gastroenterology: -abdominal pain, -nausea, -vomiting, -diarrhea, -constipation, -blood in stool, -changes in bowel movement, -dysphagia Hematology: -bleeding or bruising problems Musculoskeletal: -arthralgias, -myalgias, -joint swelling, -back pain, -neck pain, -cramping, -gait changes Ophthalmology: -vision changes, -eye redness, -itching, -discharge Urology: -dysuria, -difficulty urinating, -hematuria, -urinary frequency, -urgency, incontinence Neurology: -headache, -weakness, -tingling, -numbness, -speech abnormality, -memory loss, -falls, -dizziness Psychology:  +depressed mood, -agitation, +sleep problems    PHYSICAL EXAM:  BP 124/70   Pulse 95   Temp (!) 97.1 F (36.2 C)   Ht 5\' 5"  (1.651 m)   Wt 182 lb 3.2 oz (82.6 kg)   BMI 30.32 kg/m   General Appearance: Alert, cooperative, no distress, appears stated age Head: Normocephalic, without obvious abnormality, atraumatic Eyes: PERRL, conjunctiva/corneas clear, EOM's intact Ears:  Normal TM's and  external ear canals Nose: Mask in place Throat: Mask in place Neck: Supple, no lymphadenopathy; thyroid: no enlargement Back: Spine nontender, no curvature, ROM normal, no CVA tenderness Lungs: Clear to auscultation bilaterally without wheezes, rales or ronchi; respirations unlabored Chest Wall: No tenderness or deformity Heart: Regular rate and rhythm, S1 and S2 normal, no murmur, rub or gallop Breast Exam: OB/GYN Abdomen: Soft, non-tender, nondistended, normoactive bowel sounds, no masses, no hepatosplenomegaly Genitalia: OB/GYN Extremities: No clubbing, cyanosis or edema Pulses: 2+ and symmetric all extremities Skin: Skin color, texture, turgor normal, no rashes or lesions Lymph nodes: Cervical, supraclavicular, and axillary nodes normal Neurologic: CNII-XII intact, normal strength, sensation and gait Psych: Normal mood, affect, hygiene and grooming.  ASSESSMENT/PLAN: Medicare annual wellness visit, subsequent -Here today for a Medicare wellness visit.  No recent falls.  She does have some mild depression and anxiety related to deaths of loved ones.  No significant memory issues.  Advanced directives reviewed.  She is able to perform all ADLs without difficulty.  Medications reviewed as well as her care team  Routine general medical examination at a health care facility -Here for a CPE.  Declines labs stating she will get blood work at her upcoming endocrinology appointment.  Reviewed labs from September with her.  Reviewed preventive healthcare and she does appear to be up-to-date.  Immunizations also reviewed.  Continue with healthy diet and exercise  Advance directive discussed with patient -MOST form discussed and filled out.  Advanced directives discussed and paperwork given for her to take home.  Situational anxiety - Plan: ALPRAZolam (XANAX) 0.25 MG tablet -Encouraged her to consider grief counseling with hospice due to recent deaths of loved ones.  She has taken Xanax in the  past short-term.  She is aware that this is not a long-term treatment.  Discussed potential sedating nature of medication  Hypothyroidism, unspecified type -Thyroid function normal in September.  She will follow-up with her endocrinologist  Diabetes mellitus without complication ([place]) -Managed by endocrinology.  States she is getting used to using her freestyle libre.  States blood sugars are well controlled  Hyperlipidemia associated with type 2 diabetes mellitus ([place]) -Doing well on statin.  Continue therapy     Discussed monthly self breast exams and yearly mammograms; at least 30 minutes of aerobic activity at least 5 days/week and weight-bearing exercise 2x/week; proper sunscreen use reviewed; healthy diet, including goals of calcium and vitamin D intake and alcohol recommendations (less than or equal to 1 drink/day) reviewed; regular seatbelt use; changing batteries in smoke detectors.  Immunization recommendations discussed.  Colonoscopy recommendations reviewed   Medicare Attestation I have personally reviewed: The patient's medical and social history Their use of alcohol, tobacco or illicit drugs Their current medications and supplements The patient's functional ability including ADLs,fall risks, home safety risks, cognitive, and hearing and visual impairment Diet and physical activities Evidence for depression or mood disorders  The patient's weight, height, and BMI have been recorded in the chart.  I have made referrals, counseling, and provided education to the patient based on review of the above and I have provided the patient with a written personalized care plan for preventive services.     Harland Dingwall, NP-C   11/21/2019

## 2019-11-21 ENCOUNTER — Ambulatory Visit: Payer: Medicare PPO | Admitting: Family Medicine

## 2019-11-21 VITALS — BP 124/70 | HR 95 | Temp 97.1°F | Ht 65.0 in | Wt 182.2 lb

## 2019-11-21 DIAGNOSIS — F418 Other specified anxiety disorders: Secondary | ICD-10-CM | POA: Diagnosis not present

## 2019-11-21 DIAGNOSIS — Z7189 Other specified counseling: Secondary | ICD-10-CM | POA: Diagnosis not present

## 2019-11-21 DIAGNOSIS — Z0000 Encounter for general adult medical examination without abnormal findings: Secondary | ICD-10-CM | POA: Diagnosis not present

## 2019-11-21 DIAGNOSIS — E785 Hyperlipidemia, unspecified: Secondary | ICD-10-CM

## 2019-11-21 DIAGNOSIS — E119 Type 2 diabetes mellitus without complications: Secondary | ICD-10-CM

## 2019-11-21 DIAGNOSIS — E1169 Type 2 diabetes mellitus with other specified complication: Secondary | ICD-10-CM

## 2019-11-21 DIAGNOSIS — E039 Hypothyroidism, unspecified: Secondary | ICD-10-CM

## 2019-11-21 HISTORY — DX: Type 2 diabetes mellitus without complications: E11.9

## 2019-11-21 MED ORDER — ALPRAZOLAM 0.25 MG PO TABS: 0.2500 mg | ORAL_TABLET | Freq: Two times a day (BID) | ORAL | 0 refills | Status: DC | PRN

## 2019-11-21 NOTE — Patient Instructions (Signed)
  Ms. Courtney Barry , Thank you for taking time to come for your Medicare Wellness Visit. I appreciate your ongoing commitment to your health goals. Please review the following plan we discussed and let me know if I can assist you in the future.   These are the goals we discussed:  Consider calling Hospice for grief counseling.   Try the alprazolam as needed for anxiety and insomnia but limit the use.  Use caution since it may be sedating.   Continue taking good care of yourself.      This is a list of the screening recommended for you and due dates:  Health Maintenance  Topic Date Due  .  Hepatitis C: One time screening is recommended by Center for Disease Control  (CDC) for  adults born from [birth year] through 1963.   [birth year]  . Complete foot exam   08/21/1963  . Eye exam for diabetics  08/21/1963  . Tetanus Vaccine  08/20/1972  . Mammogram  08/21/2003  . Colon Cancer Screening  08/21/2003  . DEXA scan (bone density measurement)  08/20/2018  . Pneumonia vaccines (1 of 2 - PCV13) 08/20/2018  . Hemoglobin A1C  02/06/2020  . Flu Shot  Completed

## 2019-11-26 ENCOUNTER — Other Ambulatory Visit: Payer: Self-pay

## 2019-11-26 ENCOUNTER — Ambulatory Visit
Admission: RE | Admit: 2019-11-26 | Discharge: 2019-11-26 | Disposition: A | Discharge status: Home / Self Care | Payer: Medicare PPO | Source: Ambulatory Visit | Attending: Obstetrics & Gynecology | Admitting: Obstetrics & Gynecology

## 2019-11-26 DIAGNOSIS — Z1231 Encounter for screening mammogram for malignant neoplasm of breast: Secondary | ICD-10-CM

## 2019-12-04 ENCOUNTER — Other Ambulatory Visit: Payer: Self-pay

## 2019-12-04 ENCOUNTER — Encounter: Payer: Medicare PPO | Attending: Family Medicine | Admitting: Nutrition

## 2019-12-04 ENCOUNTER — Encounter: Payer: Self-pay | Admitting: Nutrition

## 2019-12-04 ENCOUNTER — Telehealth: Payer: Self-pay | Admitting: Family Medicine

## 2019-12-04 VITALS — Ht 64.0 in | Wt 180.0 lb

## 2019-12-04 DIAGNOSIS — E1165 Type 2 diabetes mellitus with hyperglycemia: Secondary | ICD-10-CM | POA: Insufficient documentation

## 2019-12-04 DIAGNOSIS — E782 Mixed hyperlipidemia: Secondary | ICD-10-CM | POA: Diagnosis present

## 2019-12-04 DIAGNOSIS — I10 Essential (primary) hypertension: Secondary | ICD-10-CM | POA: Diagnosis present

## 2019-12-04 NOTE — Progress Notes (Signed)
  Medical Nutrition Therapy:  Appt start time: 1445end time:  1500  Assessment:  Primary concerns today: Diabetes Type 2 x 15 years.. Lives with her husband.. She does the cooking and shopping in the home.  Her biggest thing she was cutting out snacks, Eats 3 meals per day.  Currently takes Humalog 5 units before meals and  25 units of Tresiba at night. Range in morning 106-128  Mg/dl Before lunch 78-115 mg/dl Before dinner 77-150 mg/dl Bedtime  100-140 mg/dl  Lab Results  Component Value Date   HGBA1C 6.1 08/09/2019   Lab Results  Component Value Date   HGBA1C 6.1 08/09/2019   CMP Latest Ref Rng & Units 08/09/2019  BUN 4 - 21 18  Creatinine 0.5 - 1.1 0.7     Preferred Learning Style:  No preference indicated   Learning Readiness:   Ready  Change in progress   MEDICATIONS:    DIETARY INTAKE:   24-hr recall:  B ( AM): 105 mg/dl  2 ggs, 2 slice ww, bacon, sausage, coffee and 2 equals  Snk ( AM):  L ( PM): 6 chicken nuggets, 1/2 french fries, 1/2 coke  Snk ( PM):  D ( PM): 3 winglettes, green beans, mini corn on cob and water Snk ( PM): 12 corn chips and cheese dips. Beverages: water  Usual physical activity:   Estimated energy needs: 1200 calories 135 g carbohydrates 90 g protein 33  g fat  Progress Towards Goal(s):  In progress.   Nutritional Diagnosis:  NB-1.1 Food and nutrition-related knowledge deficit As related to DIabetes Type 2.  As evidenced by A1C 6.1%  Basal, bolus insulin. Tresiba 25 units and 5 units of HUmalog FBS 103-120's BL 87-128 mg/dl. BL 85-140 mg/dl.    She has the freestyle libre.  Intervention:    Goals  7000 steps per day-work in progress Exercise 30 minutes a day Follow My Plate-doing much better. Increase fresh fruits and vegetables Avoid low blood sugars- Avoid snacks between meals or bedtime unless BS is less than 100 mg/dl. Eat 2-3 carb choices per meal Call Dr. Dorris Fetch if BS are less than 70- 3 times in a row or  week.  Teaching Method Utilized:  Visual Auditory Hands on  Handouts given during visit include:  The Plate Method   Meal Plan Card   Barriers to learning/adherence to lifestyle change: none  Demonstrated degree of understanding via:  Teach Back   Monitoring/Evaluation:  Dietary intake, exercise, , and body weight in 3 month(s).

## 2019-12-04 NOTE — Telephone Encounter (Signed)
Received requested records from [place].

## 2019-12-10 ENCOUNTER — Other Ambulatory Visit: Payer: Self-pay

## 2019-12-10 ENCOUNTER — Telehealth: Payer: Self-pay | Admitting: "Endocrinology

## 2019-12-10 MED ORDER — INSULIN ASPART 100 UNIT/ML FLEXPEN: 5.0000 [IU] | PEN_INJECTOR | Freq: Three times a day (TID) | SUBCUTANEOUS | 0 refills | Status: DC

## 2019-12-10 NOTE — Telephone Encounter (Signed)
Sent rx for novolog to [facility] to replace humalog.

## 2019-12-10 NOTE — Telephone Encounter (Signed)
Sent a rx for novolog to [facility] to replace humalog.

## 2019-12-10 NOTE — Telephone Encounter (Signed)
Pt said that her humalog is going to be expensive. She called her insurance and she can switch to novolog. That is much cheaper for her. [facility] uses [facility] [address]. Thanks.

## 2019-12-11 ENCOUNTER — Encounter: Payer: Self-pay | Admitting: Family Medicine

## 2019-12-11 ENCOUNTER — Encounter: Payer: Self-pay | Admitting: Internal Medicine

## 2019-12-18 ENCOUNTER — Encounter: Payer: Self-pay | Admitting: Nutrition

## 2019-12-18 NOTE — Patient Instructions (Signed)
Goals  7000 steps per day-work in progress Exercise 30 minutes a day Follow My Plate-doing much better. Increase fresh fruits and vegetables Avoid low blood sugars- Avoid snacks between meals or bedtime unless BS is less than 100 mg/dl. Eat 2-3 carb choices per meal Call Dr. Dorris Fetch if BS are less than 70- 3 times in a row or week.

## 2019-12-25 ENCOUNTER — Encounter: Payer: Self-pay | Admitting: Family Medicine

## 2019-12-27 ENCOUNTER — Other Ambulatory Visit: Payer: Self-pay

## 2019-12-27 ENCOUNTER — Encounter: Payer: Self-pay | Admitting: "Endocrinology

## 2019-12-27 ENCOUNTER — Ambulatory Visit: Payer: Medicare PPO | Admitting: "Endocrinology

## 2019-12-27 VITALS — BP 112/71 | HR 88 | Ht 64.0 in | Wt 180.6 lb

## 2019-12-27 DIAGNOSIS — E039 Hypothyroidism, unspecified: Secondary | ICD-10-CM | POA: Diagnosis not present

## 2019-12-27 DIAGNOSIS — E1165 Type 2 diabetes mellitus with hyperglycemia: Secondary | ICD-10-CM | POA: Diagnosis not present

## 2019-12-27 DIAGNOSIS — I10 Essential (primary) hypertension: Secondary | ICD-10-CM | POA: Diagnosis not present

## 2019-12-27 DIAGNOSIS — E782 Mixed hyperlipidemia: Secondary | ICD-10-CM

## 2019-12-27 LAB — POCT GLYCOSYLATED HEMOGLOBIN (HGB A1C): Hemoglobin A1C: 6.3 % — AB (ref 4.0–5.6)

## 2019-12-27 MED ORDER — INSULIN ASPART 100 UNIT/ML FLEXPEN: 5.0000 [IU] | PEN_INJECTOR | Freq: Three times a day (TID) | SUBCUTANEOUS | 0 refills | Status: DC

## 2019-12-27 MED ORDER — LOSARTAN POTASSIUM 25 MG PO TABS: 25.0000 mg | ORAL_TABLET | Freq: Every day | ORAL | 1 refills | Status: DC

## 2019-12-27 NOTE — Progress Notes (Signed)
12/27/2019, 6:12 PM                            Endocrinology follow-up note  Subjective:    Patient ID: Courtney Barry, female    DOB: Sep 26, 1951.  Courtney Barry is being seen in follow-up for management of currently uncontrolled symptomatic diabetes requested by  Harland Dingwall L, NP-C.   Past Medical History:  Diagnosis Date  . Diabetes mellitus, type II ([place])   . Hyperlipidemia   . Hypothyroidism   . Menopausal vaginal dryness     Past Surgical History:  Procedure Laterality Date  . ABDOMINAL HYSTERECTOMY    . APPENDECTOMY    . CHOLECYSTECTOMY    . TONSILLECTOMY      Social History   Socioeconomic History  . Marital status: Married    Spouse name: Not on file  . Number of children: Not on file  . Years of education: Not on file  . Highest education level: Not on file  Occupational History  . Not on file  Tobacco Use  . Smoking status: Never Smoker  . Smokeless tobacco: Never Used  Substance and Sexual Activity  . Alcohol use: Never  . Drug use: Never  . Sexual activity: Yes    Partners: Male    Comment: 1st intercourse- 77, partners- 43, married- 66 yrs,  Other Topics Concern  . Not on file  Social History Narrative  . Not on file   Social Determinants of Health   Financial Resource Strain:   . Difficulty of Paying Living Expenses: Not on file  Food Insecurity:   . Worried About Charity fundraiser in the Last Year: Not on file  . Ran Out of Food in the Last Year: Not on file  Transportation Needs:   . Lack of Transportation (Medical): Not on file  . Lack of Transportation (Non-Medical): Not on file  Physical Activity:   . Days of Exercise per Week: Not on file  . Minutes of Exercise per Session: Not on file  Stress:   . Feeling of Stress : Not on file  Social Connections:   . Frequency of Communication with Friends and Family: Not on file  . Frequency of  Social Gatherings with Friends and Family: Not on file  . Attends Religious Services: Not on file  . Active Member of Clubs or Organizations: Not on file  . Attends Archivist Meetings: Not on file  . Marital Status: Not on file    Family History  Problem Relation Age of Onset  . Thyroid disease Mother   . Cancer Mother   . Pancreatic cancer Mother   . Hypertension Father   . Kidney disease Father   . Thyroid cancer Sister        died at 46    Outpatient Encounter Medications as of 12/27/2019  Medication Sig  . ALPRAZolam (XANAX) 0.25 MG tablet Take 1 tablet (0.25 mg total) by mouth 2 (two) times daily as needed for anxiety.  . Ascorbic Acid (VITAMIN C PO) Take by mouth.  Marland Kitchen atorvastatin (LIPITOR) 40 MG  tablet Take 40 mg by mouth daily.  Marland Kitchen CALCIPOTRIENE-BETAMETH DIPROP EX Apply topically. For feet  . Continuous Blood Gluc Sensor (FREESTYLE LIBRE 14 DAY SENSOR) MISC Inject 1 each into the skin every 14 (fourteen) days. Use as directed.  Marland Kitchen estradiol (ESTRACE) 0.1 MG/GM vaginal cream Place [id number] Applicatorfuls vaginally 2 (two) times a week.  . insulin aspart (NOVOLOG) 100 UNIT/ML FlexPen Inject 5-8 Units into the skin 3 (three) times daily before meals.  . insulin degludec (TRESIBA FLEXTOUCH) 100 UNIT/ML SOPN FlexTouch Pen Inject 0.25 mLs (25 Units total) into the skin at bedtime.  Marland Kitchen levothyroxine (SYNTHROID) 112 MCG tablet Take 1 tablet (112 mcg total) by mouth daily before breakfast.  . loratadine (CLARITIN) 10 MG tablet Take 10 mg by mouth daily as needed for allergies.  Marland Kitchen losartan (COZAAR) 25 MG tablet Take 1 tablet (25 mg total) by mouth daily.  . Polyvinyl Alcohol-Povidone (REFRESH OP) Apply to eye.  . [DISCONTINUED] insulin aspart (NOVOLOG) 100 UNIT/ML FlexPen Inject 5 Units into the skin 3 (three) times daily with meals.  . [DISCONTINUED] losartan (COZAAR) 25 MG tablet Take 1 tablet (25 mg total) by mouth daily.  . [DISCONTINUED] pioglitazone (ACTOS) 15 MG tablet Take  15 mg by mouth daily.   No facility-administered encounter medications on file as of 12/27/2019.    ALLERGIES: Allergies  Allergen Reactions  . Latex     Gloves, band aids- causes rash  . Sulfa Antibiotics Other (See Comments)    VACCINATION STATUS: Immunization History  Administered Date(s) Administered  . Influenza-Unspecified 08/09/2019    Diabetes She presents for her follow-up diabetic visit. She has type 2 diabetes mellitus. Onset time: She was diagnosed at approximate age of 31 years. Her disease course has been stable. There are no hypoglycemic associated symptoms. Pertinent negatives for hypoglycemia include no confusion, headaches, pallor or seizures. There are no diabetic associated symptoms. Pertinent negatives for diabetes include no chest pain, no polydipsia, no polyphagia and no polyuria. There are no hypoglycemic complications. Symptoms are stable. Diabetic complications include retinopathy. Risk factors for coronary artery disease include diabetes mellitus, dyslipidemia, hypertension, post-menopausal, sedentary lifestyle and obesity. Current diabetic treatments: She is on Tresiba 25 units every morning, Humalog 8 units 3 times daily AC, pioglitazone 15 mg every other day. She is compliant with treatment most of the time. Her weight is fluctuating minimally. She is following a generally unhealthy diet. When asked about meal planning, she reported none. She has not had a previous visit with a dietitian. She participates in exercise intermittently. Her home blood glucose trend is decreasing steadily. Her breakfast blood glucose range is generally 110-130 mg/dl. Her lunch blood glucose range is generally 110-130 mg/dl. Her dinner blood glucose range is generally 110-130 mg/dl. Her bedtime blood glucose range is generally 110-130 mg/dl. Her overall blood glucose range is 110-130 mg/dl. (Her A1c on August 09, 2019 was 6.1% consistent with controlled profile.) An ACE  inhibitor/angiotensin II receptor blocker is not being taken. Eye exam is current.  Hyperlipidemia This is a chronic problem. The current episode started more than 1 year ago. Exacerbating diseases include diabetes. Pertinent negatives include no chest pain, myalgias or shortness of breath. Current antihyperlipidemic treatment includes statins. Risk factors for coronary artery disease include diabetes mellitus, dyslipidemia, hypertension, a sedentary lifestyle and post-menopausal.  Thyroid Problem Presents for initial visit. Onset time: She was diagnosed with hypothyroidism in her 93s, currently taking Synthroid 112 mcg p.o. daily. Patient reports no cold intolerance, diarrhea, heat intolerance or palpitations. Her  past medical history is significant for diabetes and hyperlipidemia.  Hypertension This is a chronic problem. Pertinent negatives include no chest pain, headaches, palpitations or shortness of breath. Risk factors for coronary artery disease include diabetes mellitus, dyslipidemia, family history, post-menopausal state and sedentary lifestyle. Hypertensive end-organ damage includes retinopathy. Identifiable causes of hypertension include a thyroid problem.    Review of systems  Constitutional: + Minimally fluctuating body weight,  current  Body mass index is 31 kg/m. , no fatigue, no subjective hyperthermia, no subjective hypothermia Eyes: no blurry vision, no xerophthalmia ENT: no sore throat, no nodules palpated in throat, no dysphagia/odynophagia, no hoarseness Cardiovascular: no Chest Pain, no Shortness of Breath, no palpitations, no leg swelling Respiratory: no cough, no shortness of breath Gastrointestinal: no Nausea/Vomiting/Diarhhea Musculoskeletal: no muscle/joint aches Skin: no rashes, no hyperemia Neurological: no tremors, no numbness, no tingling, no dizziness Psychiatric: no depression, no anxiety    Objective:    BP 112/71   Pulse 88   Ht 5\' 4"  (1.626 m)   Wt  180 lb 9.6 oz (81.9 kg)   BMI 31.00 kg/m   Wt Readings from Last 3 Encounters:  12/27/19 180 lb 9.6 oz (81.9 kg)  12/04/19 180 lb (81.6 kg)  11/21/19 182 lb 3.2 oz (82.6 kg)      Physical Exam- Limited  Constitutional:  Body mass index is 31 kg/m. , not in acute distress, normal state of mind Eyes:  EOMI, no exophthalmos Neck: Supple Thyroid: No gross goiter Respiratory: Adequate breathing efforts Musculoskeletal: no gross deformities, strength intact in all four extremities, no gross restriction of joint movements Skin:  no rashes, no hyperemia Neurological: no tremor with outstretched hands,    Recent Results (from the past 2160 hour(s))  Pap IG w/ reflex to HPV when ASC-U     Status: None   Collection Time: 10/01/19 11:21 AM  Result Value Ref Range   Clinical Information:      Comment: None given   LMP:      Comment: HSYT   PREV. PAP:      Comment: NONE GIVEN   PREV. BX:      Comment: NONE GIVEN   HPV DNA Probe-Source      Comment: Vagina   STATEMENT OF ADEQUACY:      Comment: SATISFACTORY FOR EVALUATION   INTERPRETATION/RESULT:      Comment: Negative for intraepithelial lesion or malignancy.   Comment:      Comment: This Pap test has been evaluated with computer assisted technology.    CYTOTECHNOLOGIST:      Comment: JRW, CT(ASCP) CT screening location: [id number], [place], [place] [address] EXPLANATORY NOTE:  . The Pap is a screening test for cervical cancer. It is  not a diagnostic test and is subject to false negative  and false positive results. It is most reliable when a  satisfactory sample, regularly obtained, is submitted  with relevant clinical findings and history, and when  the Pap result is evaluated along with historic and  current clinical information. .   HgB A1c     Status: Abnormal   Collection Time: 12/27/19 10:05 AM  Result Value Ref Range   Hemoglobin A1C 6.3 (A) 4.0 - 5.6 %   HbA1c POC (<> result, manual entry)      HbA1c, POC (prediabetic range)     HbA1c, POC (controlled diabetic range)        Assessment & Plan:   1. Uncontrolled type 2 diabetes mellitus with hyperglycemia (  [place])  - Courtney Barry has currently controlled asymptomatic type 2 DM since  69 years of age.  We reviewed her CGM analysis together.  She is in range 85% of the time, 15% above range.  She has no hypoglycemia.   Her point-of-care A1c 6.3%, remaining stable.    -her diabetes is complicated by retinopathy obesity/sedentary life and she remains at a high risk for more acute and chronic complications which include CAD, CVA, CKD, retinopathy, and neuropathy. These are all discussed in detail with her.  - I have counseled her on diet  and weight management  by adopting a carbohydrate restricted/protein rich diet. Patient is encouraged to switch to  unprocessed or minimally processed     complex starch and increased protein intake (animal or plant source), fruits, and vegetables. -  she is advised to stick to a routine mealtimes to eat 3 meals  a day and avoid unnecessary snacks ( to snack only to correct hypoglycemia).   - she  admits there is a room for improvement in her diet and drink choices. -  Suggestion is made for her to avoid simple carbohydrates  from her diet including Cakes, Sweet Desserts / Pastries, Ice Cream, Soda (diet and regular), Sweet Tea, Candies, Chips, Cookies, Sweet Pastries,  Store Bought Juices, Alcohol in Excess of  1-2 drinks a day, Artificial Sweeteners, Coffee Creamer, and "Sugar-free" Products. This will help patient to have stable blood glucose profile and potentially avoid unintended weight gain.  - she has been scheduled with Jearld Fenton, RDN, CDE for diabetes education.  - I have approached her with the following individualized plan to manage  her diabetes and patient agrees:   -She will benefit from simplified treatment regimen given her tight control of diabetes with A1c of 6.3%. -She will  not tolerate any higher dose of Tresiba.  She is advised to continue  Tresiba 25 units at bedtime, Humalog 5  units 3 times a day with meals  for pre-meal BG readings of 100-150mg /dl, plus patient specific correction dose for unexpected hyperglycemia above 150mg /dl, associated with strict monitoring of glucose 4 times a day-before meals and at bedtime. - she is warned not to take insulin without proper monitoring per orders. - Adjustment parameters are given to her for hypo and hyperglycemia in writing. - she is encouraged to call clinic for blood glucose levels less than 70 or above 300 mg /dl. - she will benefit from a CGM -she is advised to wear it at all times.   -She has a chance to come off of Humalog, promises to do better on her diet. - she wishes to avoid Metformin retry, pioglitazone.  She has allergy to sulfa, not a candidate for glipizide.  - she will be considered for incretin therapy as appropriate next visit.  - Specific targets for  A1c;  LDL, HDL, Triglycerides, and  Waist Circumference were discussed with the patient.  2) Blood Pressure /Hypertension: Her blood pressure is controlled to target.  I discussed initiated losartan 25 mg p.o. daily at breakfast.  3) Lipids/Hyperlipidemia: She does not have recent lipid panel to discuss.  She is currently on atorvastatin 40 mg p.o. nightly.  She will be considered for fasting lipid panel along with her next labs.     4) hypothyroidism-longstanding diagnosis since at least from her 61s. The circumstances of her diagnosis are not available to review.  Patient denies any exposure to I-131 nor thyroidectomy. -Currently on Synthroid 112 mcg p.o. daily  before breakfast.  She will continue on same dose until next measurement.   - We discussed about the correct intake of her thyroid hormone, on empty stomach at fasting, with water, separated by at least 30 minutes from breakfast and other medications,  and separated by more than 4 hours from  calcium, iron, multivitamins, acid reflux medications (PPIs). -Patient is made aware of the fact that thyroid hormone replacement is needed for life, dose to be adjusted by periodic monitoring of thyroid function tests.   5)  Weight/Diet: Her BMI is 32- clearly complicating her diabetes care.   she is  a candidate for modest weight loss. I discussed with her the fact that loss of 5 - 10% of her  current body weight will have the most impact on her diabetes management.  Exercise, and detailed carbohydrates information provided  -  detailed on discharge instructions.  6) Chronic Care/Health Maintenance:  -she  is on ACEI/ARB and Statin medications and  is encouraged to initiate and continue to follow up with Ophthalmology, Dentist,  Podiatrist at least yearly or according to recommendations, and advised to  stay away from smoking. I have recommended yearly flu vaccine and pneumonia vaccine at least every 5 years; moderate intensity exercise for up to 150 minutes weekly; and  sleep for at least 7 hours a day.  - she is  advised to maintain close follow up with Girtha Rm, NP-C for primary care needs, as well as her other providers for optimal and coordinated care.  - Time spent on this patient care encounter:  35 min, of which > 50% was spent in  counseling and the rest reviewing her blood glucose logs , discussing her hypoglycemia and hyperglycemia episodes, reviewing her current and  previous labs / studies  ( including abstraction from other facilities) and medications  doses and developing a  long term treatment plan and documenting her care.   Please refer to Patient Instructions for Blood Glucose Monitoring and Insulin/Medications Dosing Guide"  in media tab for additional information. Please  also refer to " Patient Self Inventory" in the Media  tab for reviewed elements of pertinent patient history.  Courtney Barry participated in the discussions, expressed understanding, and voiced  agreement with the above plans.  All questions were answered to her satisfaction. she is encouraged to contact clinic should she have any questions or concerns prior to her return visit.     Follow up plan: - Return in about 4 months (around 04/25/2020) for Follow up with Pre-visit Labs, Bring Meter and Logs- A1c in Office.  Glade Lloyd, MD [facility] [address] [place], [place] [address] Phone: [phone]  Fax: [phone]    12/27/2019, 6:12 PM  This note was partially dictated with voice recognition software. Similar sounding words can be transcribed inadequately or may not  be corrected upon review.

## 2019-12-27 NOTE — Patient Instructions (Signed)

## 2020-01-04 ENCOUNTER — Other Ambulatory Visit: Payer: Self-pay

## 2020-01-04 ENCOUNTER — Telehealth: Payer: Self-pay | Admitting: Family Medicine

## 2020-01-04 DIAGNOSIS — Z841 Family history of disorders of kidney and ureter: Secondary | ICD-10-CM

## 2020-01-04 DIAGNOSIS — E119 Type 2 diabetes mellitus without complications: Secondary | ICD-10-CM

## 2020-01-04 DIAGNOSIS — I152 Hypertension secondary to endocrine disorders: Secondary | ICD-10-CM

## 2020-01-04 DIAGNOSIS — E559 Vitamin D deficiency, unspecified: Secondary | ICD-10-CM

## 2020-01-04 DIAGNOSIS — E039 Hypothyroidism, unspecified: Secondary | ICD-10-CM

## 2020-01-04 NOTE — Telephone Encounter (Signed)
Pt came in and dropped off lab orders from [facility]. She is schedule to see them again 04/28/2020. She needs labs drawn prior to appt. She would like to have those drawn here. Sending lab orders back. Please advise pt and plase orders in system.

## 2020-01-04 NOTE — Telephone Encounter (Signed)
This is fine. Please put in future orders for her. Thanks.

## 2020-01-07 NOTE — Telephone Encounter (Signed)
Done KH 

## 2020-01-08 ENCOUNTER — Telehealth: Payer: Self-pay

## 2020-01-08 NOTE — Telephone Encounter (Signed)
Courtney Barry, [place] [address] [place]

## 2020-01-08 NOTE — Telephone Encounter (Signed)
Pt. Called stating that she was talking to you about your dentist because she needs to find a new one and she forgot who you said your dentist was. Please advise.

## 2020-01-09 NOTE — Telephone Encounter (Signed)
Pt. Aware of recommendations.  

## 2020-01-31 ENCOUNTER — Ambulatory Visit (HOSPITAL_COMMUNITY)
Admission: RE | Admit: 2020-01-31 | Discharge: 2020-01-31 | Disposition: A | Discharge status: Home / Self Care | Payer: Self-pay | Source: Ambulatory Visit

## 2020-02-07 ENCOUNTER — Ambulatory Visit: Payer: Medicare PPO | Admitting: Family Medicine

## 2020-02-07 ENCOUNTER — Encounter: Payer: Self-pay | Admitting: Family Medicine

## 2020-02-07 ENCOUNTER — Other Ambulatory Visit: Payer: Self-pay

## 2020-02-07 VITALS — BP 120/70 | HR 84 | Temp 98.2°F | Wt 180.2 lb

## 2020-02-07 DIAGNOSIS — J302 Other seasonal allergic rhinitis: Secondary | ICD-10-CM

## 2020-02-07 DIAGNOSIS — L299 Pruritus, unspecified: Secondary | ICD-10-CM

## 2020-02-07 DIAGNOSIS — T7840XA Allergy, unspecified, initial encounter: Secondary | ICD-10-CM

## 2020-02-07 MED ORDER — LEVOCETIRIZINE DIHYDROCHLORIDE 5 MG PO TABS: 5.0000 mg | ORAL_TABLET | Freq: Every evening | ORAL | 1 refills | Status: DC

## 2020-02-07 MED ORDER — PREDNISONE 10 MG (21) PO TBPK: ORAL_TABLET | Freq: Every day | ORAL | 0 refills | Status: DC

## 2020-02-07 NOTE — Patient Instructions (Signed)
Try to avoid being outdoors for long periods or working in the grass for the next few weeks.  Stop the Claritin  Start Xyzal for your allergies.  Once daily.  Take the prednisone Dosepak as discussed and be aware of possible side effects such as insomnia, increased thirst, increased urination, elevated blood sugars. Let me know if you are having any severe side effects  We will be in touch with your lab results

## 2020-02-07 NOTE — Progress Notes (Signed)
   Subjective:    Patient ID: Courtney Barry, female    DOB: 04-29-1951, 69 y.o.   MRN: [id number]  HPI Chief Complaint  Patient presents with  . itching all over    itching all over but can't see anything. doing yard work 2 weekends ago   Complains of itching all over for the past 2 weeks approximately. This has happened in the past when she worked outside. This time her symptoms began after working outside as well. Denies rash.   States she has had to take oral steroids in the past when she had these symptoms.  States she is miserable.  Denies any new lotions, soaps, detergents or chemicals.   States she uses Oil of olay on her face, Jergens lotion. No new medications.   She has taken Claritin daily for years and it is not helping with her current symptoms. Also taking Benadryl generic nightly for the past week. This makes her very sleepy.   Denies fever, chills, dizziness, chest pain, palpitations, shortness of breath, abdominal pain, N/V/D, urinary symptoms, LE edema.  No arthralgias or myalgias   Reviewed allergies, medications, past medical, surgical, family, and social history.   Review of Systems Pertinent positives and negatives in the history of present illness.     Objective:   Physical Exam BP 120/70   Pulse 84   Temp 98.2 F (36.8 C)   Wt 180 lb 3.2 oz (81.7 kg)   BMI 30.93 kg/m   Alert and in no distress.  Neck is supple without adenopathy. Cardiac exam shows a regular rhythm without murmurs or gallops. Lungs are clear to auscultation. Skin is warm and dry, no rash.        Assessment & Plan:  Pruritus - Plan: CBC with Differential/Platelet, Comprehensive metabolic panel, TSH, predniSONE (STERAPRED UNI-PAK 21 TAB) 10 MG (21) TBPK tablet  Seasonal allergies - Plan: levocetirizine (XYZAL) 5 MG tablet  Allergy, initial encounter - Plan: predniSONE (STERAPRED UNI-PAK 21 TAB) 10 MG (21) TBPK tablet  Most likely her allergies have been triggered and now  she is hypersensitive. She has a history of this. Claritin is no longer working for her so she will stop it and try Xyzal.  I will also prescribe a steroid dose pak. We discussed the risks vs benefits and she prefers to take the medication and is aware of potential side effects such as insomnia, flushing, increased blood sugars, increased thirst, urination and fluid retention.  Recommend she avoid working outside for now and shower if she goes out for long periods.  Follow up with me pending labs and next week.

## 2020-02-08 LAB — TSH: TSH: 1.46 u[IU]/mL (ref 0.450–4.500)

## 2020-02-08 LAB — COMPREHENSIVE METABOLIC PANEL
ALT: 20 IU/L (ref 0–32)
AST: 15 IU/L (ref 0–40)
Albumin/Globulin Ratio: 1.4 (ref 1.2–2.2)
Albumin: 3.8 g/dL (ref 3.8–4.8)
Alkaline Phosphatase: 81 IU/L (ref 39–117)
BUN/Creatinine Ratio: 22 (ref 12–28)
BUN: 16 mg/dL (ref 8–27)
Bilirubin Total: 0.4 mg/dL (ref 0.0–1.2)
CO2: 22 mmol/L (ref 20–29)
Calcium: 9 mg/dL (ref 8.7–10.3)
Chloride: 105 mmol/L (ref 96–106)
Creatinine, Ser: 0.74 mg/dL (ref 0.57–1.00)
GFR calc Af Amer: 98 mL/min/{1.73_m2} (ref >59)
GFR calc non Af Amer: 85 mL/min/{1.73_m2} (ref >59)
Globulin, Total: 2.8 g/dL (ref 1.5–4.5)
Glucose: 205 mg/dL — ABNORMAL HIGH (ref 65–99)
Potassium: 4 mmol/L (ref 3.5–5.2)
Sodium: 142 mmol/L (ref 134–144)
Total Protein: 6.6 g/dL (ref 6.0–8.5)

## 2020-02-08 LAB — CBC WITH DIFFERENTIAL/PLATELET
Basophils Absolute: 0 10*3/uL (ref 0.0–0.2)
Basos: 0 %
EOS (ABSOLUTE): 0.1 10*3/uL (ref 0.0–0.4)
Eos: 1 %
Hematocrit: 38 % (ref 34.0–46.6)
Hemoglobin: 12.3 g/dL (ref 11.1–15.9)
Immature Grans (Abs): 0 10*3/uL (ref 0.0–0.1)
Immature Granulocytes: 0 %
Lymphocytes Absolute: 2.3 10*3/uL (ref 0.7–3.1)
Lymphs: 40 %
MCH: 25.4 pg — ABNORMAL LOW (ref 26.6–33.0)
MCHC: 32.4 g/dL (ref 31.5–35.7)
MCV: 79 fL (ref 79–97)
Monocytes Absolute: 0.4 10*3/uL (ref 0.1–0.9)
Monocytes: 6 %
Neutrophils Absolute: 3 10*3/uL (ref 1.4–7.0)
Neutrophils: 53 %
Platelets: 184 10*3/uL (ref 150–450)
RBC: 4.84 x10E6/uL (ref 3.77–5.28)
RDW: 13.6 % (ref 11.7–15.4)
WBC: 5.7 10*3/uL (ref 3.4–10.8)

## 2020-02-11 ENCOUNTER — Ambulatory Visit (INDEPENDENT_AMBULATORY_CARE_PROVIDER_SITE_OTHER): Payer: Medicare PPO | Admitting: Family Medicine

## 2020-02-11 ENCOUNTER — Encounter: Payer: Self-pay | Admitting: Family Medicine

## 2020-02-11 VITALS — Wt 180.0 lb

## 2020-02-11 DIAGNOSIS — L299 Pruritus, unspecified: Secondary | ICD-10-CM | POA: Diagnosis not present

## 2020-02-11 DIAGNOSIS — J302 Other seasonal allergic rhinitis: Secondary | ICD-10-CM | POA: Diagnosis not present

## 2020-02-11 NOTE — Progress Notes (Signed)
   Subjective:  Documentation for virtual audio and video telecommunications through [place] encounter:  The patient was located at home. 2 patient identifiers used.  The provider was located in the office. The patient did consent to this visit and is aware of possible charges through their insurance for this visit.  The other persons participating in this telemedicine service were none.    Patient ID: Courtney Barry, female    DOB: 1951/07/01, 69 y.o.   MRN: [id number]  HPI Chief Complaint  Patient presents with  . virtual f.u    virtual f/u. alittle itching but cleared up alot   This is a follow up on an in office visit 4 days ago for an allergic reaction and pruritis. States she feels at least 75% improved.   She has been taking oral prednisone and Xyzal. Having some loose stools.   Her blood sugars have been elevated after eating but her FBS this morning was 120.   Denies fever, chills, dizziness, chest pain, shortness of breath, N/V.   Reviewed allergies, medications, past medical, surgical, family, and social history.    Review of Systems Pertinent positives and negatives in the history of present illness.     Objective:   Physical Exam Wt 180 lb (81.6 kg)   BMI 30.90 kg/m   Alert and oriented and in no acute distress. Speaking in complete sentences without difficultly. Normal speech and mood.       Assessment & Plan:  Pruritus  Seasonal allergies  She is doing much better, at least 75%.  Complete prednisone taper. Blood sugars are returning to baseline.  Stay on [place], this is helping.   She questions if I am ok with her starting on melatonin for sleep and I am fine with this.  Time spent on call was 11 minutes and in review of previous records 15 minutes total.  This virtual service is not related to other E/M service within previous 7 days.

## 2020-02-25 ENCOUNTER — Telehealth: Payer: Self-pay

## 2020-02-25 NOTE — Telephone Encounter (Signed)
Pt would like the metformin called that her and Dr nida discussed

## 2020-02-26 ENCOUNTER — Other Ambulatory Visit: Payer: Self-pay | Admitting: "Endocrinology

## 2020-02-26 MED ORDER — METFORMIN HCL ER 500 MG PO TB24: 500.0000 mg | ORAL_TABLET | Freq: Every day | ORAL | 3 refills | Status: DC

## 2020-02-26 NOTE — Telephone Encounter (Signed)
She needs to be informed that I sent a rx for metformin to her pharmacy.

## 2020-02-28 DIAGNOSIS — E1165 Type 2 diabetes mellitus with hyperglycemia: Secondary | ICD-10-CM | POA: Diagnosis not present

## 2020-02-28 DIAGNOSIS — Z794 Long term (current) use of insulin: Secondary | ICD-10-CM | POA: Diagnosis not present

## 2020-03-03 DIAGNOSIS — E785 Hyperlipidemia, unspecified: Secondary | ICD-10-CM | POA: Diagnosis not present

## 2020-03-03 DIAGNOSIS — E1165 Type 2 diabetes mellitus with hyperglycemia: Secondary | ICD-10-CM | POA: Diagnosis not present

## 2020-03-03 DIAGNOSIS — E039 Hypothyroidism, unspecified: Secondary | ICD-10-CM | POA: Diagnosis not present

## 2020-03-03 DIAGNOSIS — I10 Essential (primary) hypertension: Secondary | ICD-10-CM | POA: Diagnosis not present

## 2020-03-03 DIAGNOSIS — E669 Obesity, unspecified: Secondary | ICD-10-CM | POA: Diagnosis not present

## 2020-03-03 DIAGNOSIS — Z841 Family history of disorders of kidney and ureter: Secondary | ICD-10-CM | POA: Diagnosis not present

## 2020-03-04 ENCOUNTER — Other Ambulatory Visit: Payer: Self-pay | Admitting: Family Medicine

## 2020-03-04 DIAGNOSIS — F418 Other specified anxiety disorders: Secondary | ICD-10-CM

## 2020-03-04 DIAGNOSIS — J302 Other seasonal allergic rhinitis: Secondary | ICD-10-CM

## 2020-03-05 NOTE — Telephone Encounter (Signed)
Is this okay to refill? 

## 2020-03-31 ENCOUNTER — Other Ambulatory Visit: Payer: Self-pay | Admitting: Family Medicine

## 2020-03-31 DIAGNOSIS — Z794 Long term (current) use of insulin: Secondary | ICD-10-CM | POA: Diagnosis not present

## 2020-03-31 DIAGNOSIS — E1165 Type 2 diabetes mellitus with hyperglycemia: Secondary | ICD-10-CM | POA: Diagnosis not present

## 2020-03-31 DIAGNOSIS — J302 Other seasonal allergic rhinitis: Secondary | ICD-10-CM

## 2020-04-04 ENCOUNTER — Telehealth: Payer: Self-pay

## 2020-04-04 MED ORDER — ATORVASTATIN CALCIUM 40 MG PO TABS: 40.0000 mg | ORAL_TABLET | Freq: Every day | ORAL | 2 refills | Status: DC

## 2020-04-04 NOTE — Telephone Encounter (Signed)
done

## 2020-04-04 NOTE — Telephone Encounter (Signed)
Pt. Called stating that she needs a refill on her atorvastatin 40mg  1 daily sent in to the [facility] [place]. Pt. Last apt was 02/11/20.

## 2020-04-21 ENCOUNTER — Other Ambulatory Visit: Payer: Self-pay

## 2020-04-21 ENCOUNTER — Other Ambulatory Visit: Payer: Medicare PPO

## 2020-04-21 DIAGNOSIS — I152 Hypertension secondary to endocrine disorders: Secondary | ICD-10-CM

## 2020-04-21 DIAGNOSIS — E039 Hypothyroidism, unspecified: Secondary | ICD-10-CM

## 2020-04-21 DIAGNOSIS — E119 Type 2 diabetes mellitus without complications: Secondary | ICD-10-CM

## 2020-04-21 DIAGNOSIS — Z841 Family history of disorders of kidney and ureter: Secondary | ICD-10-CM

## 2020-04-21 DIAGNOSIS — E559 Vitamin D deficiency, unspecified: Secondary | ICD-10-CM | POA: Diagnosis not present

## 2020-04-22 LAB — VITAMIN D 25 HYDROXY (VIT D DEFICIENCY, FRACTURES): Vit D, 25-Hydroxy: 42.7 ng/mL (ref 30.0–100.0)

## 2020-04-22 LAB — T4, FREE: Free T4: 1.33 ng/dL (ref 0.82–1.77)

## 2020-04-22 LAB — TSH: TSH: 0.737 u[IU]/mL (ref 0.450–4.500)

## 2020-04-22 LAB — COMPREHENSIVE METABOLIC PANEL
ALT: 25 IU/L (ref 0–32)
AST: 16 IU/L (ref 0–40)
Albumin/Globulin Ratio: 1.7 (ref 1.2–2.2)
Albumin: 4.3 g/dL (ref 3.8–4.8)
Alkaline Phosphatase: 76 IU/L (ref 48–121)
BUN/Creatinine Ratio: 26 (ref 12–28)
BUN: 18 mg/dL (ref 8–27)
Bilirubin Total: 0.7 mg/dL (ref 0.0–1.2)
CO2: 23 mmol/L (ref 20–29)
Calcium: 9.6 mg/dL (ref 8.7–10.3)
Chloride: 104 mmol/L (ref 96–106)
Creatinine, Ser: 0.7 mg/dL (ref 0.57–1.00)
GFR calc Af Amer: 104 mL/min/{1.73_m2} (ref >59)
GFR calc non Af Amer: 91 mL/min/{1.73_m2} (ref >59)
Globulin, Total: 2.6 g/dL (ref 1.5–4.5)
Glucose: 146 mg/dL — ABNORMAL HIGH (ref 65–99)
Potassium: 4.6 mmol/L (ref 3.5–5.2)
Sodium: 139 mmol/L (ref 134–144)
Total Protein: 6.9 g/dL (ref 6.0–8.5)

## 2020-04-22 LAB — MICROALBUMIN / CREATININE URINE RATIO
Creatinine, Urine: 91.3 mg/dL
Microalb/Creat Ratio: <3 mg/g creat (ref 0–29)
Microalbumin, Urine: <3 ug/mL

## 2020-04-22 LAB — LIPID PANEL
Chol/HDL Ratio: 2.6 ratio (ref 0.0–4.4)
Cholesterol, Total: 116 mg/dL (ref 100–199)
HDL: 44 mg/dL (ref >39)
LDL Chol Calc (NIH): 59 mg/dL (ref 0–99)
Triglycerides: 59 mg/dL (ref 0–149)
VLDL Cholesterol Cal: 13 mg/dL (ref 5–40)

## 2020-04-28 ENCOUNTER — Other Ambulatory Visit: Payer: Self-pay

## 2020-04-28 ENCOUNTER — Ambulatory Visit: Payer: Medicare PPO | Admitting: Nutrition

## 2020-04-28 ENCOUNTER — Ambulatory Visit: Payer: Medicare PPO | Admitting: "Endocrinology

## 2020-04-28 ENCOUNTER — Encounter: Payer: Self-pay | Admitting: "Endocrinology

## 2020-04-28 VITALS — BP 112/64 | HR 85 | Ht 64.0 in | Wt 174.8 lb

## 2020-04-28 DIAGNOSIS — I10 Essential (primary) hypertension: Secondary | ICD-10-CM | POA: Diagnosis not present

## 2020-04-28 DIAGNOSIS — E782 Mixed hyperlipidemia: Secondary | ICD-10-CM

## 2020-04-28 DIAGNOSIS — E1165 Type 2 diabetes mellitus with hyperglycemia: Secondary | ICD-10-CM

## 2020-04-28 DIAGNOSIS — E039 Hypothyroidism, unspecified: Secondary | ICD-10-CM | POA: Diagnosis not present

## 2020-04-28 LAB — POCT GLYCOSYLATED HEMOGLOBIN (HGB A1C): Hemoglobin A1C: 7 % — AB (ref 4.0–5.6)

## 2020-04-28 MED ORDER — TRULICITY 0.75 MG/0.5ML ~~LOC~~ SOAJ
0.7500 mg | SUBCUTANEOUS | 2 refills | Status: DC
Start: 2020-04-28 — End: 2020-07-07

## 2020-04-28 MED ORDER — LEVOTHYROXINE SODIUM 112 MCG PO TABS: 112.0000 ug | ORAL_TABLET | Freq: Every day | ORAL | 1 refills | Status: DC

## 2020-04-28 NOTE — Patient Instructions (Signed)

## 2020-04-28 NOTE — Progress Notes (Signed)
04/28/2020, 12:26 PM                            Endocrinology follow-up note  Subjective:    Patient ID: Courtney Barry, female    DOB: 09-Feb-1951.  Courtney Barry is being seen in follow-up for management of currently uncontrolled symptomatic diabetes requested by  Harland Dingwall L, NP-C.   Past Medical History:  Diagnosis Date  . Diabetes mellitus, type II ([place])   . Hyperlipidemia   . Hypothyroidism   . Menopausal vaginal dryness     Past Surgical History:  Procedure Laterality Date  . ABDOMINAL HYSTERECTOMY    . APPENDECTOMY    . CHOLECYSTECTOMY    . TONSILLECTOMY      Social History   Socioeconomic History  . Marital status: Married    Spouse name: Not on file  . Number of children: Not on file  . Years of education: Not on file  . Highest education level: Not on file  Occupational History  . Not on file  Tobacco Use  . Smoking status: Never Smoker  . Smokeless tobacco: Never Used  Vaping Use  . Vaping Use: Never used  Substance and Sexual Activity  . Alcohol use: Never  . Drug use: Never  . Sexual activity: Yes    Partners: Male    Comment: 1st intercourse- 44, partners- 62, married- 70 yrs,  Other Topics Concern  . Not on file  Social History Narrative  . Not on file   Social Determinants of Health   Financial Resource Strain:   . Difficulty of Paying Living Expenses:   Food Insecurity:   . Worried About Charity fundraiser in the Last Year:   . Arboriculturist in the Last Year:   Transportation Needs:   . Film/video editor (Medical):   Marland Kitchen Lack of Transportation (Non-Medical):   Physical Activity:   . Days of Exercise per Week:   . Minutes of Exercise per Session:   Stress:   . Feeling of Stress :   Social Connections:   . Frequency of Communication with Friends and Family:   . Frequency of Social Gatherings with Friends and Family:   . Attends  Religious Services:   . Active Member of Clubs or Organizations:   . Attends Archivist Meetings:   Marland Kitchen Marital Status:     Family History  Problem Relation Age of Onset  . Thyroid disease Mother   . Cancer Mother   . Pancreatic cancer Mother   . Hypertension Father   . Kidney disease Father   . Thyroid cancer Sister        died at 77    Outpatient Encounter Medications as of 04/28/2020  Medication Sig  . Ascorbic Acid (VITAMIN C PO) Take by mouth.  Marland Kitchen atorvastatin (LIPITOR) 40 MG tablet Take 1 tablet (40 mg total) by mouth daily.  . Continuous Blood Gluc Sensor (FREESTYLE LIBRE 14 DAY SENSOR) MISC Inject 1 each into the skin every 14 (fourteen) days. Use as directed.  . Dulaglutide (TRULICITY) 6.75 QG/9.2EF SOPN  Inject 0.5 mLs (0.75 mg total) into the skin once a week.  . estradiol (ESTRACE) 0.1 MG/GM vaginal cream Place 5.09 Applicatorfuls vaginally 2 (two) times a week.  . insulin degludec (TRESIBA FLEXTOUCH) 100 UNIT/ML SOPN FlexTouch Pen Inject 0.25 mLs (25 Units total) into the skin at bedtime.  Marland Kitchen levocetirizine (XYZAL) 5 MG tablet TAKE 1 TABLET BY MOUTH EVERY DAY IN THE EVENING  . levothyroxine (SYNTHROID) 112 MCG tablet Take 1 tablet (112 mcg total) by mouth daily before breakfast.  . losartan (COZAAR) 25 MG tablet Take 1 tablet (25 mg total) by mouth daily.  . metFORMIN (GLUCOPHAGE XR) 500 MG 24 hr tablet Take 1 tablet (500 mg total) by mouth daily with breakfast.  . [DISCONTINUED] ALPRAZolam (XANAX) 0.25 MG tablet TAKE 1 TABLET BY MOUTH 2 TIMES DAILY AS NEEDED FOR ANXIETY.  . [DISCONTINUED] CALCIPOTRIENE-BETAMETH DIPROP EX Apply topically. For feet  . [DISCONTINUED] insulin lispro (HUMALOG) 100 UNIT/ML injection Inject 5 Units into the skin 3 (three) times daily before meals.   . [DISCONTINUED] levothyroxine (SYNTHROID) 112 MCG tablet Take 1 tablet (112 mcg total) by mouth daily before breakfast.  . [DISCONTINUED] Polyvinyl Alcohol-Povidone (REFRESH OP) Apply to eye.   . [DISCONTINUED] predniSONE (STERAPRED UNI-PAK 21 TAB) 10 MG (21) TBPK tablet Take by mouth daily.   No facility-administered encounter medications on file as of 04/28/2020.    ALLERGIES: Allergies  Allergen Reactions  . Latex     Gloves, band aids- causes rash  . Sulfa Antibiotics Other (See Comments)    VACCINATION STATUS: Immunization History  Administered Date(s) Administered  . Influenza-Unspecified 08/09/2019    Diabetes She presents for her follow-up diabetic visit. She has type 2 diabetes mellitus. Onset time: She was diagnosed at approximate age of 45 years. Her disease course has been improving. There are no hypoglycemic associated symptoms. Pertinent negatives for hypoglycemia include no confusion, headaches, pallor or seizures. There are no diabetic associated symptoms. Pertinent negatives for diabetes include no chest pain, no polydipsia, no polyphagia and no polyuria. There are no hypoglycemic complications. Symptoms are improving. Diabetic complications include retinopathy. Risk factors for coronary artery disease include diabetes mellitus, dyslipidemia, hypertension, post-menopausal, sedentary lifestyle and obesity. Current diabetic treatments: She is on Tresiba 25 units every morning, Humalog 8 units 3 times daily AC, pioglitazone 15 mg every other day. She is compliant with treatment most of the time. Her weight is decreasing steadily. She is following a generally unhealthy diet. When asked about meal planning, she reported none. She has not had a previous visit with a dietitian. She participates in exercise intermittently. Her home blood glucose trend is decreasing steadily. Her breakfast blood glucose range is generally 110-130 mg/dl. Her lunch blood glucose range is generally 130-140 mg/dl. Her dinner blood glucose range is generally 130-140 mg/dl. Her bedtime blood glucose range is generally 130-140 mg/dl. Her overall blood glucose range is 110-130 mg/dl. (Her A1c on  August 09, 2019 was 6.1% consistent with controlled profile.) An ACE inhibitor/angiotensin II receptor blocker is not being taken. Eye exam is current.  Hyperlipidemia This is a chronic problem. The current episode started more than 1 year ago. Exacerbating diseases include diabetes. Pertinent negatives include no chest pain, myalgias or shortness of breath. Current antihyperlipidemic treatment includes statins. Risk factors for coronary artery disease include diabetes mellitus, dyslipidemia, hypertension, a sedentary lifestyle and post-menopausal.  Thyroid Problem Presents for initial visit. Onset time: She was diagnosed with hypothyroidism in her 52s, currently taking Synthroid 112 mcg p.o. daily. Patient reports  no cold intolerance, diarrhea, heat intolerance or palpitations. Her past medical history is significant for diabetes and hyperlipidemia.  Hypertension This is a chronic problem. Pertinent negatives include no chest pain, headaches, palpitations or shortness of breath. Risk factors for coronary artery disease include diabetes mellitus, dyslipidemia, family history, post-menopausal state and sedentary lifestyle. Hypertensive end-organ damage includes retinopathy. Identifiable causes of hypertension include a thyroid problem.    Review of systems  Constitutional: + Minimally fluctuating body weight,  current  Body mass index is 30 kg/m. , no fatigue, no subjective hyperthermia, no subjective hypothermia Eyes: no blurry vision, no xerophthalmia ENT: no sore throat, no nodules palpated in throat, no dysphagia/odynophagia, no hoarseness Cardiovascular: no Chest Pain, no Shortness of Breath, no palpitations, no leg swelling Respiratory: no cough, no shortness of breath Gastrointestinal: no Nausea/Vomiting/Diarhhea Musculoskeletal: no muscle/joint aches Skin: no rashes, no hyperemia Neurological: no tremors, no numbness, no tingling, no dizziness Psychiatric: no depression, no  anxiety    Objective:    BP 112/64   Pulse 85   Ht 5' 4" (1.626 m)   Wt 174 lb 12.8 oz (79.3 kg)   BMI 30.00 kg/m   Wt Readings from Last 3 Encounters:  04/28/20 174 lb 12.8 oz (79.3 kg)  02/11/20 180 lb (81.6 kg)  02/07/20 180 lb 3.2 oz (81.7 kg)      Physical Exam- Limited  Constitutional:  Body mass index is 30 kg/m. , not in acute distress, normal state of mind Eyes:  EOMI, no exophthalmos Neck: Supple Thyroid: No gross goiter Respiratory: Adequate breathing efforts Musculoskeletal: no gross deformities, strength intact in all four extremities, no gross restriction of joint movements Skin:  no rashes, no hyperemia Neurological: no tremor with outstretched hands,    Recent Results (from the past 2160 hour(s))  CBC with Differential/Platelet     Status: Abnormal   Collection Time: 02/07/20  3:30 PM  Result Value Ref Range   WBC 5.7 3.4 - 10.8 x10E3/uL   RBC 4.84 3.77 - 5.28 x10E6/uL   Hemoglobin 12.3 11.1 - 15.9 g/dL   Hematocrit 38.0 34.0 - 46.6 %   MCV 79 79 - 97 fL   MCH 25.4 (L) 26.6 - 33.0 pg   MCHC 32.4 31 - 35 g/dL   RDW 13.6 11.7 - 15.4 %   Platelets 184 150 - 450 x10E3/uL   Neutrophils 53 Not Estab. %   Lymphs 40 Not Estab. %   Monocytes 6 Not Estab. %   Eos 1 Not Estab. %   Basos 0 Not Estab. %   Neutrophils Absolute 3.0 1 - 7 x10E3/uL   Lymphocytes Absolute 2.3 0 - 3 x10E3/uL   Monocytes Absolute 0.4 0 - 0 x10E3/uL   EOS (ABSOLUTE) 0.1 0.0 - 0.4 x10E3/uL   Basophils Absolute 0.0 0 - 0 x10E3/uL   Immature Granulocytes 0 Not Estab. %   Immature Grans (Abs) 0.0 0.0 - 0.1 x10E3/uL  Comprehensive metabolic panel     Status: Abnormal   Collection Time: 02/07/20  3:30 PM  Result Value Ref Range   Glucose 205 (H) 65 - 99 mg/dL   BUN 16 8 - 27 mg/dL   Creatinine, Ser 0.74 0.57 - 1.00 mg/dL   GFR calc non Af Amer 85 >59 mL/min/1.73   GFR calc Af Amer 98 >59 mL/min/1.73   BUN/Creatinine Ratio 22 12 - 28   Sodium 142 134 - 144 mmol/L   Potassium 4.0  3.5 - 5.2 mmol/L   Chloride 105 96 -  106 mmol/L   CO2 22 20 - 29 mmol/L   Calcium 9.0 8.7 - 10.3 mg/dL   Total Protein 6.6 6.0 - 8.5 g/dL   Albumin 3.8 3.8 - 4.8 g/dL   Globulin, Total 2.8 1.5 - 4.5 g/dL   Albumin/Globulin Ratio 1.4 1.2 - 2.2   Bilirubin Total 0.4 0.0 - 1.2 mg/dL   Alkaline Phosphatase 81 39 - 117 IU/L   AST 15 0 - 40 IU/L   ALT 20 0 - 32 IU/L  TSH     Status: None   Collection Time: 02/07/20  3:30 PM  Result Value Ref Range   TSH 1.460 0.450 - 4.500 uIU/mL  T4, Free     Status: None   Collection Time: 04/21/20  9:05 AM  Result Value Ref Range   Free T4 1.33 0.82 - 1.77 ng/dL  TSH     Status: None   Collection Time: 04/21/20  9:05 AM  Result Value Ref Range   TSH 0.737 0.450 - 4.500 uIU/mL  Lipid Panel     Status: None   Collection Time: 04/21/20  9:05 AM  Result Value Ref Range   Cholesterol, Total 116 100 - 199 mg/dL   Triglycerides 59 0 - 149 mg/dL   HDL 44 >39 mg/dL   VLDL Cholesterol Cal 13 5 - 40 mg/dL   LDL Chol Calc (NIH) 59 0 - 99 mg/dL   Chol/HDL Ratio 2.6 0.0 - 4.4 ratio    Comment:                                   T. Chol/HDL Ratio                                             Men  Women                               1/2 Avg.Risk  3.4    3.3                                   Avg.Risk  5.0    4.4                                2X Avg.Risk  9.6    7.1                                3X Avg.Risk 23.4   11.0   Microalbumin/Creatinine Ratio, Urine     Status: None   Collection Time: 04/21/20  9:05 AM  Result Value Ref Range   Creatinine, Urine 91.3 Not Estab. mg/dL   Microalbumin, Urine <3.0 Not Estab. ug/mL   Microalb/Creat Ratio <3 0 - 29 mg/g creat    Comment:                        Normal:                0 -  29  Moderately increased: 30 - 300                        Severely increased:       >300   Vitamin D, 25-hydroxy     Status: None   Collection Time: 04/21/20  9:05 AM  Result Value Ref Range   Vit D, 25-Hydroxy  42.7 30.0 - 100.0 ng/mL    Comment: Vitamin D deficiency has been defined by the [place] practice guideline as a level of serum 25-OH vitamin D less than 20 ng/mL (1,2). The Endocrine Society went on to further define vitamin D insufficiency as a level between 21 and 29 ng/mL (2). 1. IOM (Institute of Medicine). 2010. Dietary reference    intakes for calcium and D. [place]: The    [employer]. 2. Holick MF, Binkley [place], Bischoff-Ferrari HA, et al.    Evaluation, treatment, and prevention of vitamin D    deficiency: an Endocrine Society clinical practice    guideline. JCEM. 2011 Jul; 96(7):1911-30.   Comprehensive metabolic panel     Status: Abnormal   Collection Time: 04/21/20  9:05 AM  Result Value Ref Range   Glucose 146 (H) 65 - 99 mg/dL   BUN 18 8 - 27 mg/dL   Creatinine, Ser 0.70 0.57 - 1.00 mg/dL   GFR calc non Af Amer 91 >59 mL/min/1.73   GFR calc Af Amer 104 >59 mL/min/1.73    Comment: **[facility] currently reports eGFR in compliance with the current**   recommendations of the [employer]. [facility] will   update reporting as new guidelines are published from the NKF-ASN   Task force.    BUN/Creatinine Ratio 26 12 - 28   Sodium 139 134 - 144 mmol/L   Potassium 4.6 3.5 - 5.2 mmol/L   Chloride 104 96 - 106 mmol/L   CO2 23 20 - 29 mmol/L   Calcium 9.6 8.7 - 10.3 mg/dL   Total Protein 6.9 6.0 - 8.5 g/dL   Albumin 4.3 3.8 - 4.8 g/dL   Globulin, Total 2.6 1.5 - 4.5 g/dL   Albumin/Globulin Ratio 1.7 1.2 - 2.2   Bilirubin Total 0.7 0.0 - 1.2 mg/dL   Alkaline Phosphatase 76 48 - 121 IU/L   AST 16 0 - 40 IU/L   ALT 25 0 - 32 IU/L  HgB A1c     Status: Abnormal   Collection Time: 04/28/20  9:42 AM  Result Value Ref Range   Hemoglobin A1C 7.0 (A) 4.0 - 5.6 %   HbA1c POC (<> result, manual entry)     HbA1c, POC (prediabetic range)     HbA1c, POC (controlled diabetic range)        Assessment & Plan:    1. Uncontrolled type 2 diabetes mellitus with hyperglycemia ([place])  - Courtney Barry has currently controlled asymptomatic type 2 DM since  69 years of age.  We reviewed her CGM analysis together.  She is in range 93% of the time, 7% above range.  She has no hypoglycemia.   Her point-of-care A1c 7%,  remaining stable.    -her diabetes is complicated by retinopathy obesity/sedentary life and she remains at a high risk for more acute and chronic complications which include CAD, CVA, CKD, retinopathy, and neuropathy. These are all discussed in detail with her.  - I have counseled her on diet  and weight management  by adopting a carbohydrate restricted/protein  rich diet. Patient is encouraged to switch to  unprocessed or minimally processed     complex starch and increased protein intake (animal or plant source), fruits, and vegetables. -  she is advised to stick to a routine mealtimes to eat 3 meals  a day and avoid unnecessary snacks ( to snack only to correct hypoglycemia).   - she  admits there is a room for improvement in her diet and drink choices. -  Suggestion is made for her to avoid simple carbohydrates  from her diet including Cakes, Sweet Desserts / Pastries, Ice Cream, Soda (diet and regular), Sweet Tea, Candies, Chips, Cookies, Sweet Pastries,  Store Bought Juices, Alcohol in Excess of  1-2 drinks a day, Artificial Sweeteners, Coffee Creamer, and "Sugar-free" Products. This will help patient to have stable blood glucose profile and potentially avoid unintended weight gain.   - she has been scheduled with Jearld Fenton, RDN, CDE for diabetes education.  - I have approached her with the following individualized plan to manage  her diabetes and patient agrees:   -She will continue to benefit from simplified treatment regimen, given her tight control of diabetes.  She took Humalog only 30% of the times.   -She will not tolerate any higher dose of Tresiba.  She is advised to  continue Tresiba 25 units nightly, discontinue Humalog.   -She will continue to monitor blood glucose 4 times a day-before meals and at bedtime. - she is warned not to take insulin without proper monitoring per orders. - Adjustment parameters are given to her for hypo and hyperglycemia in writing. - she is encouraged to call clinic for blood glucose levels less than 70 or above 300 mg /dl. - she will benefit from a CGM -she is advised to wear it at all times.   -She has minor GI side effects from Metformin, advised to switch it to suppertime-500 mg XR daily after supper.    - She has allergy to sulfa, not a candidate for glipizide. -She has been verified to me that the family history of thyroid cancer she had is papillary thyroid cancer.  She would benefit from incretin therapy.  I discussed and initiated Trulicity 1.61 mg subcutaneously weekly.  - Specific targets for  A1c;  LDL, HDL, Triglycerides,  were discussed with the patient.  2) Blood Pressure /Hypertension: Her blood pressure is controlled to target.   I discussed initiated losartan 25 mg p.o. daily at breakfast.  3) Lipids/Hyperlipidemia: She does not have recent lipid panel to discuss.  She is currently on atorvastatin 40 mg p.o. nightly.   She will be considered for fasting lipid panel along with her next labs.     4) hypothyroidism-longstanding diagnosis since at least from her 74s. The circumstances of her diagnosis are not available to review.  Patient denies any exposure to I-131 nor thyroidectomy. -Currently on Synthroid 112 mcg p.o. daily before breakfast.  Her previsit thyroid function tests are consistent with appropriate replacement.     - We discussed about the correct intake of her thyroid hormone, on empty stomach at fasting, with water, separated by at least 30 minutes from breakfast and other medications,  and separated by more than 4 hours from calcium, iron, multivitamins, acid reflux medications (PPIs). -Patient  is made aware of the fact that thyroid hormone replacement is needed for life, dose to be adjusted by periodic monitoring of thyroid function tests.   5)  Weight/Diet: Her BMI is 30.0-   she is  a candidate for modest weight loss. I discussed with her the fact that loss of 5 - 10% of her  current body weight will have the most impact on her diabetes management.  Exercise, and detailed carbohydrates information provided  -  detailed on discharge instructions.  6) Chronic Care/Health Maintenance:  -she  is on ACEI/ARB and Statin medications and  is encouraged to initiate and continue to follow up with Ophthalmology, Dentist,  Podiatrist at least yearly or according to recommendations, and advised to  stay away from smoking. I have recommended yearly flu vaccine and pneumonia vaccine at least every 5 years; moderate intensity exercise for up to 150 minutes weekly; and  sleep for at least 7 hours a day.  - she is  advised to maintain close follow up with Girtha Rm, NP-C for primary care needs, as well as her other providers for optimal and coordinated care.  - Time spent on this patient care encounter:  35 min, of which > 50% was spent in  counseling and the rest reviewing her blood glucose logs , discussing her hypoglycemia and hyperglycemia episodes, reviewing her current and  previous labs / studies  ( including abstraction from other facilities) and medications  doses and developing a  long term treatment plan and documenting her care.   Please refer to Patient Instructions for Blood Glucose Monitoring and Insulin/Medications Dosing Guide"  in media tab for additional information. Please  also refer to " Patient Self Inventory" in the Media  tab for reviewed elements of pertinent patient history.  Courtney Barry participated in the discussions, expressed understanding, and voiced agreement with the above plans.  All questions were answered to her satisfaction. she is encouraged to contact  clinic should she have any questions or concerns prior to her return visit.  Follow up plan: - Return in about 4 months (around 08/28/2020) for Bring Meter and Logs- A1c in Office.  Glade Lloyd, MD [facility] [address] [place], [place] [address] Phone: [phone]  Fax: [phone]    04/28/2020, 12:26 PM  This note was partially dictated with voice recognition software. Similar sounding words can be transcribed inadequately or may not  be corrected upon review.

## 2020-05-09 DIAGNOSIS — Z794 Long term (current) use of insulin: Secondary | ICD-10-CM | POA: Diagnosis not present

## 2020-05-09 DIAGNOSIS — E1165 Type 2 diabetes mellitus with hyperglycemia: Secondary | ICD-10-CM | POA: Diagnosis not present

## 2020-05-13 ENCOUNTER — Other Ambulatory Visit: Payer: Self-pay

## 2020-05-13 ENCOUNTER — Encounter: Payer: Medicare PPO | Attending: "Endocrinology | Admitting: Nutrition

## 2020-05-13 ENCOUNTER — Encounter: Payer: Self-pay | Admitting: Nutrition

## 2020-05-13 VITALS — Ht 64.0 in | Wt 173.0 lb

## 2020-05-13 DIAGNOSIS — E1165 Type 2 diabetes mellitus with hyperglycemia: Secondary | ICD-10-CM | POA: Diagnosis not present

## 2020-05-13 DIAGNOSIS — E782 Mixed hyperlipidemia: Secondary | ICD-10-CM | POA: Diagnosis not present

## 2020-05-13 DIAGNOSIS — I10 Essential (primary) hypertension: Secondary | ICD-10-CM | POA: Diagnosis not present

## 2020-05-13 NOTE — Patient Instructions (Signed)
Goals  [facility] a week Keep walking. Eat meals on time Prevent low blood sugars Eat 30  grams of carbs per meals. Lose 7 lbs in the next 3 months.

## 2020-05-13 NOTE — Progress Notes (Signed)
  Medical Nutrition Therapy:  Appt start time: 1030 end time:  1100  Assessment:  Primary concerns today: Diabetes Type 2 x 15 years.. Lives with her husband..   A1C was 7%.  25 units of Tresiba. Saw  Dr. Dorris Fetch last month. Stopped Humalog with meals and started Trulicity. Lost 7 lbs. Started walking and walking a mile and 1/2 every other day. [facility] BS in 80's most every am.  Uses the [place]. [name] shows excellent blood sugar control. Avg 105 mg/dl. Feels much better.   Lab Results  Component Value Date   HGBA1C 7.0 (A) 04/28/2020   CMP Latest Ref Rng & Units 04/21/2020 02/07/2020 08/09/2019  Glucose 65 - 99 mg/dL 146(H) 205(H) -  BUN 8 - 27 mg/dL 18 16 18   Creatinine 0.57 - 1.00 mg/dL 0.70 0.74 0.7  Sodium 134 - 144 mmol/L 139 142 -  Potassium 3.5 - 5.2 mmol/L 4.6 4.0 -  Chloride 96 - 106 mmol/L 104 105 -  CO2 20 - 29 mmol/L 23 22 -  Calcium 8.7 - 10.3 mg/dL 9.6 9.0 -  Total Protein 6.0 - 8.5 g/dL 6.9 6.6 -  Total Bilirubin 0.0 - 1.2 mg/dL 0.7 0.4 -  Alkaline Phos 48 - 121 IU/L 76 81 -  AST 0 - 40 IU/L 16 15 -  ALT 0 - 32 IU/L 25 20 -     Preferred Learning Style:  No preference indicated   Learning Readiness:   Ready  Change in progress   MEDICATIONS:    DIETARY INTAKE:   24-hr recall:  B ( AM): 105 mg/dl  2 ggs, 2 slice ww, bacon, sausage, coffee and 2 equals  Snk ( AM):  L ( PM): 6 chicken nuggets, 1/2 french fries, 1/2 coke  Snk ( PM):  D ( PM): 3 winglettes, green beans, mini corn on cob and water Snk ( PM): 12 corn chips and cheese dips. Beverages: water  Usual physical activity:   Estimated energy needs: 1200 calories 135 g carbohydrates 90 g protein 33  g fat  Progress Towards Goal(s):  In progress.   Nutritional Diagnosis:  NB-1.1 Food and nutrition-related knowledge deficit As related to DIabetes Type 2.  As evidenced by A1C 6.1%  Basal, bolus insulin. Tresiba 25 units and 5 units of HUmalog FBS 103-120's BL  87-128 mg/dl. BL 85-140 mg/dl.    She has the freestyle libre.  Intervention:  Meal planning, portion control, high fiber diet,   Goals  Go to [facility] a week Keep walking. Eat meals on time Prevent low blood sugars Eat 30  grams of carbs per meals. Lose 7 lbs in the next 3 months.  Teaching Method Utilized:  Visual Auditory Hands on  Handouts given during visit include:  The Plate Method   Meal Plan Card   Barriers to learning/adherence to lifestyle change: none  Demonstrated degree of understanding via:  Teach Back   Monitoring/Evaluation:  Dietary intake, exercise, , and body weight in 3 month(s).

## 2020-05-14 ENCOUNTER — Other Ambulatory Visit: Payer: Self-pay | Admitting: "Endocrinology

## 2020-05-20 NOTE — Progress Notes (Signed)
Subjective:    Patient ID: Courtney Barry, female    DOB: [birth year], 69 y.o.   MRN: [id number]  HPI Chief Complaint  Patient presents with  . med check    med check-    HTN- taking losartan 25 mg.  Checks BP at home once weekly   HL- taking atorvastatin daily. Lipids checked by Dr. Dorris Fetch in June and LDL 59.   Diabetes- managed by Dr. Dorris Fetch. She is still having some low readings around 3 am and will report back to Dr. Dorris Fetch. Last A1c 7.0% in June.   Hypothyroidism- taking levothyroxine daily as prescribed. Dr. Dorris Fetch checked thyroid function and normal last month.   Right pinky pain and TTP x one week. Possibly hit her right hand on something. No numbness, tingling or weakness.   Complains of left shoulder pain x 2 months. Pain is an ache and mainly posterior. Pain with certain movements and sleeping on her left shoulder. No numbness or tingling. She thinks she may have some left arm weakness. No known injury. States since going to the gym and working her shoulder it has actually improved somewhat.  Takes Tylenol 500 mg prn.    Review of Systems Pertinent positives and negatives in the history of present illness.     Objective:   Physical Exam Constitutional:      Appearance: Normal appearance. She is not ill-appearing.  Cardiovascular:     Rate and Rhythm: Normal rate.     Pulses: Normal pulses.  Pulmonary:     Effort: Pulmonary effort is normal.  Musculoskeletal:     Left shoulder: Tenderness present. No swelling, deformity or crepitus. Decreased range of motion. Normal strength.       Hands:     Cervical back: Normal range of motion and neck supple.     Comments: She is TTP over her left supraspinatus muscle. She does not have full flexion due to pain. No erythema, rash, bruising or swelling. Normal sensation and strength of LUE and shoulder joint. Normal lift off test. No pain with internal rotation or abduction.  No laxity. Negative Neers and Hawkins   Bruising  noted over her MCP joint of 5th finger on right hand with TTP. No erythema, edema or increased warmth. Good brisk capillary refill, sensation and strength.   Neurological:     Mental Status: She is alert.    BP 120/70   Pulse 72   Wt 173 lb (78.5 kg)   SpO2 99%   BMI 29.70 kg/m        Assessment & Plan:  Essential hypertension, benign -Blood pressure well controlled.  Continue on current medication.  Continue with low-sodium diet and exercise.  Hyperlipidemia associated with type 2 diabetes mellitus ([place]) -Lipid panel last month was quite good.  Continue statin therapy  Diabetes mellitus without complication ([place]) -She is concerned about having low blood sugars especially around 3 AM.  She will follow-up with Dr. Dorris Fetch her endocrinologist.  Pain involving joint of finger of right hand -Right hand is neurovascularly intact.  Recommend using a topical pain medication and continuing to use her hand.  She will let me know if her pain or symptoms worsen.  Chronic left shoulder pain - Plan: DG Shoulder Left -Discussed potential supraspinatus tendinitis.  We will try conservative therapy.  I will send her for a left shoulder x-ray.  Try topical pain medication as well as Tylenol, she does not like to take NSAIDs.  Consider physical therapy versus referral  to Ortho.

## 2020-05-21 ENCOUNTER — Other Ambulatory Visit: Payer: Self-pay

## 2020-05-21 ENCOUNTER — Encounter: Payer: Self-pay | Admitting: Family Medicine

## 2020-05-21 ENCOUNTER — Ambulatory Visit: Payer: Medicare PPO | Admitting: Family Medicine

## 2020-05-21 ENCOUNTER — Ambulatory Visit
Admission: RE | Admit: 2020-05-21 | Discharge: 2020-05-21 | Disposition: A | Discharge status: Home / Self Care | Payer: Medicare PPO | Source: Ambulatory Visit | Attending: Family Medicine | Admitting: Family Medicine

## 2020-05-21 ENCOUNTER — Telehealth: Payer: Self-pay | Admitting: "Endocrinology

## 2020-05-21 VITALS — BP 120/70 | HR 72 | Wt 173.0 lb

## 2020-05-21 DIAGNOSIS — G8929 Other chronic pain: Secondary | ICD-10-CM

## 2020-05-21 DIAGNOSIS — E785 Hyperlipidemia, unspecified: Secondary | ICD-10-CM

## 2020-05-21 DIAGNOSIS — E1169 Type 2 diabetes mellitus with other specified complication: Secondary | ICD-10-CM

## 2020-05-21 DIAGNOSIS — M25541 Pain in joints of right hand: Secondary | ICD-10-CM

## 2020-05-21 DIAGNOSIS — M25512 Pain in left shoulder: Secondary | ICD-10-CM

## 2020-05-21 DIAGNOSIS — E119 Type 2 diabetes mellitus without complications: Secondary | ICD-10-CM

## 2020-05-21 DIAGNOSIS — I10 Essential (primary) hypertension: Secondary | ICD-10-CM | POA: Diagnosis not present

## 2020-05-21 DIAGNOSIS — M19012 Primary osteoarthritis, left shoulder: Secondary | ICD-10-CM | POA: Diagnosis not present

## 2020-05-21 NOTE — Telephone Encounter (Signed)
Pt is still experiencing low readings in the mornings. She usually around 3 am it is pretty low. She did start the trulicity. Please call pt

## 2020-05-23 ENCOUNTER — Encounter: Payer: Self-pay | Admitting: Family Medicine

## 2020-05-25 ENCOUNTER — Other Ambulatory Visit: Payer: Self-pay | Admitting: Family Medicine

## 2020-05-26 NOTE — Telephone Encounter (Signed)
Pt called and states she is feeling better and her stress levels are going down. She states her readings are also better. Patient thinks it is from where she has not been eating and under a lot of stress due to her husband having surgery today. Since his surgery this morning pt is feeling better.

## 2020-05-26 NOTE — Telephone Encounter (Signed)
Discussed with pt the importance to eat on a regular schedule, understanding voiced.

## 2020-05-26 NOTE — Telephone Encounter (Signed)
Left a message requesting a return call to the office. 

## 2020-06-09 DIAGNOSIS — E1165 Type 2 diabetes mellitus with hyperglycemia: Secondary | ICD-10-CM | POA: Diagnosis not present

## 2020-06-09 DIAGNOSIS — Z794 Long term (current) use of insulin: Secondary | ICD-10-CM | POA: Diagnosis not present

## 2020-06-21 ENCOUNTER — Other Ambulatory Visit: Payer: Self-pay | Admitting: "Endocrinology

## 2020-06-22 ENCOUNTER — Other Ambulatory Visit: Payer: Self-pay | Admitting: Family Medicine

## 2020-06-22 DIAGNOSIS — J302 Other seasonal allergic rhinitis: Secondary | ICD-10-CM

## 2020-06-23 NOTE — Telephone Encounter (Signed)
Pt has an appt in january

## 2020-07-07 ENCOUNTER — Other Ambulatory Visit: Payer: Self-pay | Admitting: "Endocrinology

## 2020-07-10 DIAGNOSIS — Z794 Long term (current) use of insulin: Secondary | ICD-10-CM | POA: Diagnosis not present

## 2020-07-10 DIAGNOSIS — E1165 Type 2 diabetes mellitus with hyperglycemia: Secondary | ICD-10-CM | POA: Diagnosis not present

## 2020-07-17 ENCOUNTER — Ambulatory Visit: Payer: Medicare PPO | Admitting: Family Medicine

## 2020-07-17 ENCOUNTER — Other Ambulatory Visit: Payer: Self-pay

## 2020-07-17 ENCOUNTER — Encounter: Payer: Self-pay | Admitting: Family Medicine

## 2020-07-17 VITALS — BP 120/70 | HR 70 | Temp 97.6°F | Wt 175.2 lb

## 2020-07-17 DIAGNOSIS — M19012 Primary osteoarthritis, left shoulder: Secondary | ICD-10-CM | POA: Diagnosis not present

## 2020-07-17 DIAGNOSIS — J302 Other seasonal allergic rhinitis: Secondary | ICD-10-CM

## 2020-07-17 DIAGNOSIS — M25521 Pain in right elbow: Secondary | ICD-10-CM | POA: Diagnosis not present

## 2020-07-17 DIAGNOSIS — M25522 Pain in left elbow: Secondary | ICD-10-CM | POA: Diagnosis not present

## 2020-07-17 NOTE — Patient Instructions (Signed)
Try Claritin or Allegra.   Use ice or heat and the topical Voltaren gel on your shoulder and elbow as needed.   Follow up if you are needing Aleve fairly regularly.

## 2020-07-17 NOTE — Progress Notes (Addendum)
   Subjective:    Patient ID: Courtney Barry, female    DOB: 27-Feb-1951, 69 y.o.   MRN: [id number]  HPI Chief Complaint  Patient presents with  . right arm    right arm pain x 2 weeks   Complains of improving right elbow pain for the past 2-3 weeks. The pain was a fairly quick onset.  She was lifting heavy lawnmower prior to the pain starting. Non radiating and has been worse with certain movements. States it feels much better today and she almost canceled her appointment.   States she has been using topical Voltaren gel and a heating pad. She has been taking Aleve prn.   No numbness, tingling or weakness.  Denies fever, chills, erythema, edema.   Hx of chronic and intermittent left shoulder pain. Has been using Voltaren gel and doing fine. Would like for me to review her shoulder XR  States Xyzal makes her sleepy and her allergies have been bothering her lately. States Zyrtec also makes her drowsy.   Reviewed allergies, medications, past medical, surgical, family, and social history.   Review of Systems Pertinent positives and negatives in the history of present illness.     Objective:   Physical Exam Constitutional:      Appearance: Normal appearance.  Cardiovascular:     Pulses: Normal pulses.  Pulmonary:     Effort: Pulmonary effort is normal.  Musculoskeletal:     Right shoulder: Normal.     Right upper arm: Normal.     Right elbow: Normal.     Right wrist: Normal.     Cervical back: Normal range of motion and neck supple.     Comments: Normal appearing right elbow without TTP. Normal sensation, ROM and strength.   Skin:    General: Skin is warm and dry.     Capillary Refill: Capillary refill takes less than 2 seconds.  Neurological:     General: No focal deficit present.     Mental Status: She is alert and oriented to person, place, and time.     Cranial Nerves: No cranial nerve deficit.     Sensory: No sensory deficit.     Motor: No weakness.      Coordination: Coordination normal.    BP 120/70   Pulse 70   Temp 97.6 F (36.4 C)   Wt 175 lb 3.2 oz (79.5 kg)   BMI 30.07 kg/m         Assessment & Plan:  Right elbow pain  Osteoarthritis of left shoulder, unspecified osteoarthritis type  Seasonal allergies  Elbow exam is unremarkable.  Continue conservative treatment for elbow and shoulder. Voltaren gel, ice or heat and oral Aleve as needed. She will hold off on playing in her golf tournament for now. Rest her elbow and then start back as tolerated.  Switch to Claritin or Allegra for allergies.

## 2020-07-24 ENCOUNTER — Telehealth: Payer: Self-pay

## 2020-07-24 DIAGNOSIS — M25521 Pain in right elbow: Secondary | ICD-10-CM

## 2020-07-24 NOTE — Telephone Encounter (Signed)
Pt. Called stating that her rt. Elbow still not doing well the Aleve only works for a little bit wants to know if you need to call something else in for the pain or order and x-ray or something.

## 2020-07-24 NOTE — Addendum Note (Signed)
Addended by: Minette Headland A on: 07/24/2020 04:52 PM   Modules accepted: Orders

## 2020-07-24 NOTE — Telephone Encounter (Signed)
Please order a right elbow XR for her due to right elbow pain.

## 2020-07-24 NOTE — Telephone Encounter (Signed)
Pt states its her right elbow. She was seen last week for right elbow but left elbow is stated in the chart. Please fix this

## 2020-07-24 NOTE — Telephone Encounter (Signed)
I corrected this. thanks

## 2020-07-25 ENCOUNTER — Other Ambulatory Visit: Payer: Self-pay | Admitting: Internal Medicine

## 2020-07-25 ENCOUNTER — Ambulatory Visit
Admission: RE | Admit: 2020-07-25 | Discharge: 2020-07-25 | Disposition: A | Discharge status: Home / Self Care | Payer: Medicare PPO | Source: Ambulatory Visit | Attending: Family Medicine | Admitting: Family Medicine

## 2020-07-25 DIAGNOSIS — M25521 Pain in right elbow: Secondary | ICD-10-CM

## 2020-07-25 DIAGNOSIS — M19021 Primary osteoarthritis, right elbow: Secondary | ICD-10-CM | POA: Diagnosis not present

## 2020-07-25 NOTE — Progress Notes (Signed)
Please refer her to [facility] unless she has an orthopedist already. This is for right elbow pain.

## 2020-07-30 ENCOUNTER — Ambulatory Visit: Payer: Medicare PPO | Admitting: "Endocrinology

## 2020-07-31 ENCOUNTER — Other Ambulatory Visit: Payer: Self-pay

## 2020-07-31 ENCOUNTER — Ambulatory Visit: Payer: Medicare PPO | Admitting: Family Medicine

## 2020-07-31 ENCOUNTER — Encounter: Payer: Self-pay | Admitting: Family Medicine

## 2020-07-31 ENCOUNTER — Telehealth: Payer: Self-pay

## 2020-07-31 DIAGNOSIS — M25521 Pain in right elbow: Secondary | ICD-10-CM | POA: Diagnosis not present

## 2020-07-31 DIAGNOSIS — M79642 Pain in left hand: Secondary | ICD-10-CM | POA: Diagnosis not present

## 2020-07-31 DIAGNOSIS — M79641 Pain in right hand: Secondary | ICD-10-CM

## 2020-07-31 NOTE — Progress Notes (Signed)
Office Visit Note   Patient: Courtney Barry           Date of Birth: [birth year]           MRN: [id number] Visit Date: 07/31/2020 Requested by: Girtha Rm, NP-C [address] [place],  [place] [address] PCP: Girtha Rm, NP-C  Subjective: Chief Complaint  Patient presents with  . Right Elbow - Pain    Pain in lateral aspect x 30 days - started a few days after helping her husband lift a lawn mower into the truck. Aches worse at night. No n/t in hand/fingers. Right-hand dominant. Salon Pas patches help.    HPI: She is here with right elbow and bilateral hand pain.  About a month ago she was helping her husband lift a lawnmower, she did not feel pain immediately but 2 days later she developed pain on the lateral aspect of her right elbow.  Is been hurting her since then.  She gets a little bit of relief with Voltaren gel.  No numbness or tingling.  She is right-hand dominant.  Both of her hands have been struggling with trigger fingers.  She has had an injection in the past but it did not improve her range of motion on the right.               ROS:   All other systems were reviewed and are negative.  Objective: Vital Signs: There were no vitals taken for this visit.  Physical Exam:  General:  Alert and oriented, in no acute distress. Pulm:  Breathing unlabored. Psy:  Normal mood, congruent affect. Skin: No erythema or bruising Right elbow: No effusion, full range of motion.  She is point tender at the common extensor tendon at the lateral epicondyle.  Pain with third finger extension and forearm pronation against resistance.  No tenderness at the radial tunnel. Hands: She has tender nodules at the A1 pulley of the fourth fingers.   Imaging: No results found.  Assessment & Plan: 1.  Right elbow lateral epicondylitis -We discussed options and elected to try tennis elbow strap, occupational/hand therapy for modalities.  Injection if symptoms persist.  2.   Bilateral trigger fingers -Occupational Therapy as above.     Procedures: No procedures performed  No notes on file     PMFS History: Patient Active Problem List   Diagnosis Date Noted  . Situational anxiety 11/21/2019  . Hyperlipidemia associated with type 2 diabetes mellitus ([place]) 11/21/2019  . Diabetes mellitus without complication ([place]) 48/54/6270  . Uncontrolled type 2 diabetes mellitus with hyperglycemia ([place]) 09/24/2019  . Essential hypertension, benign 09/24/2019  . Hypothyroidism 09/24/2019  . Mixed hyperlipidemia 09/24/2019  . Menopausal vaginal dryness   . Cyst of vulva 09/11/2015  . Epidermoid cyst of skin 09/11/2015  . Melanocytic nevus 09/11/2015  . Dysplastic nevus of skin 09/11/2015  . Normal gynecologic examination 09/05/2014  . Encounter for specialized medical examination 09/04/2013   Past Medical History:  Diagnosis Date  . Diabetes mellitus, type II ([place])   . Hyperlipidemia   . Hypothyroidism   . Menopausal vaginal dryness     Family History  Problem Relation Age of Onset  . Thyroid disease Mother   . Cancer Mother   . Pancreatic cancer Mother   . Hypertension Father   . Kidney disease Father   . Thyroid cancer Sister        died at 1    Past Surgical History:  Procedure Laterality Date  .  ABDOMINAL HYSTERECTOMY    . APPENDECTOMY    . CHOLECYSTECTOMY    . TONSILLECTOMY     Social History   Occupational History  . Not on file  Tobacco Use  . Smoking status: Never Smoker  . Smokeless tobacco: Never Used  Vaping Use  . Vaping Use: Never used  Substance and Sexual Activity  . Alcohol use: Never  . Drug use: Never  . Sexual activity: Yes    Partners: Male    Comment: 1st intercourse- 21, partners- 14, married- 77 yrs,

## 2020-07-31 NOTE — Telephone Encounter (Signed)
Santiago Glad with [employer] would like to have referral, office notes, and patient demographics faxed to [phone].  Cb# [phone].  Please advise.

## 2020-08-01 ENCOUNTER — Other Ambulatory Visit: Payer: Self-pay

## 2020-08-01 DIAGNOSIS — M79641 Pain in right hand: Secondary | ICD-10-CM

## 2020-08-01 DIAGNOSIS — M25521 Pain in right elbow: Secondary | ICD-10-CM

## 2020-08-01 NOTE — Telephone Encounter (Signed)
Order faxed.

## 2020-08-01 NOTE — Telephone Encounter (Signed)
The order is now in the referrals tab. The patient was given the hard copy. Would you please fax this info for me?

## 2020-08-10 ENCOUNTER — Other Ambulatory Visit: Payer: Self-pay | Admitting: "Endocrinology

## 2020-08-11 DIAGNOSIS — Z794 Long term (current) use of insulin: Secondary | ICD-10-CM | POA: Diagnosis not present

## 2020-08-11 DIAGNOSIS — E1165 Type 2 diabetes mellitus with hyperglycemia: Secondary | ICD-10-CM | POA: Diagnosis not present

## 2020-08-11 DIAGNOSIS — M25541 Pain in joints of right hand: Secondary | ICD-10-CM | POA: Diagnosis not present

## 2020-08-11 DIAGNOSIS — M25521 Pain in right elbow: Secondary | ICD-10-CM | POA: Diagnosis not present

## 2020-08-11 DIAGNOSIS — M6281 Muscle weakness (generalized): Secondary | ICD-10-CM | POA: Diagnosis not present

## 2020-08-11 DIAGNOSIS — M25542 Pain in joints of left hand: Secondary | ICD-10-CM | POA: Diagnosis not present

## 2020-08-18 DIAGNOSIS — M25521 Pain in right elbow: Secondary | ICD-10-CM | POA: Diagnosis not present

## 2020-08-18 DIAGNOSIS — M6281 Muscle weakness (generalized): Secondary | ICD-10-CM | POA: Diagnosis not present

## 2020-08-18 DIAGNOSIS — M25541 Pain in joints of right hand: Secondary | ICD-10-CM | POA: Diagnosis not present

## 2020-08-18 DIAGNOSIS — M25542 Pain in joints of left hand: Secondary | ICD-10-CM | POA: Diagnosis not present

## 2020-08-20 DIAGNOSIS — M25542 Pain in joints of left hand: Secondary | ICD-10-CM | POA: Diagnosis not present

## 2020-08-20 DIAGNOSIS — M25541 Pain in joints of right hand: Secondary | ICD-10-CM | POA: Diagnosis not present

## 2020-08-20 DIAGNOSIS — M25521 Pain in right elbow: Secondary | ICD-10-CM | POA: Diagnosis not present

## 2020-08-20 DIAGNOSIS — M6281 Muscle weakness (generalized): Secondary | ICD-10-CM | POA: Diagnosis not present

## 2020-08-25 ENCOUNTER — Ambulatory Visit (INDEPENDENT_AMBULATORY_CARE_PROVIDER_SITE_OTHER): Payer: Medicare Other

## 2020-08-25 ENCOUNTER — Other Ambulatory Visit: Payer: Self-pay

## 2020-08-25 DIAGNOSIS — M25542 Pain in joints of left hand: Secondary | ICD-10-CM | POA: Diagnosis not present

## 2020-08-25 DIAGNOSIS — Z23 Encounter for immunization: Secondary | ICD-10-CM

## 2020-08-25 DIAGNOSIS — M25541 Pain in joints of right hand: Secondary | ICD-10-CM | POA: Diagnosis not present

## 2020-08-25 DIAGNOSIS — M6281 Muscle weakness (generalized): Secondary | ICD-10-CM | POA: Diagnosis not present

## 2020-08-25 DIAGNOSIS — M25521 Pain in right elbow: Secondary | ICD-10-CM | POA: Diagnosis not present

## 2020-08-26 ENCOUNTER — Telehealth: Payer: Self-pay

## 2020-08-26 NOTE — Telephone Encounter (Signed)
Returned call to [facility] and spoke with Elberta Fortis, he will refax CMN form to office

## 2020-08-26 NOTE — Telephone Encounter (Signed)
Jasamine with [employer] is calling regarding some OV notes/ CMN form. Return call to [phone]

## 2020-08-27 DIAGNOSIS — M25542 Pain in joints of left hand: Secondary | ICD-10-CM | POA: Diagnosis not present

## 2020-08-27 DIAGNOSIS — M6281 Muscle weakness (generalized): Secondary | ICD-10-CM | POA: Diagnosis not present

## 2020-08-27 DIAGNOSIS — M25541 Pain in joints of right hand: Secondary | ICD-10-CM | POA: Diagnosis not present

## 2020-08-27 DIAGNOSIS — M25521 Pain in right elbow: Secondary | ICD-10-CM | POA: Diagnosis not present

## 2020-08-28 ENCOUNTER — Encounter: Payer: Self-pay | Admitting: Nurse Practitioner

## 2020-08-28 ENCOUNTER — Encounter: Payer: Medicare PPO | Attending: "Endocrinology | Admitting: Nutrition

## 2020-08-28 ENCOUNTER — Ambulatory Visit: Payer: Medicare PPO | Admitting: Nurse Practitioner

## 2020-08-28 ENCOUNTER — Other Ambulatory Visit: Payer: Self-pay

## 2020-08-28 VITALS — BP 120/75 | HR 93 | Ht 64.0 in | Wt 172.2 lb

## 2020-08-28 DIAGNOSIS — E782 Mixed hyperlipidemia: Secondary | ICD-10-CM

## 2020-08-28 DIAGNOSIS — E1169 Type 2 diabetes mellitus with other specified complication: Secondary | ICD-10-CM | POA: Diagnosis not present

## 2020-08-28 DIAGNOSIS — E785 Hyperlipidemia, unspecified: Secondary | ICD-10-CM | POA: Insufficient documentation

## 2020-08-28 DIAGNOSIS — I10 Essential (primary) hypertension: Secondary | ICD-10-CM | POA: Diagnosis not present

## 2020-08-28 DIAGNOSIS — E1165 Type 2 diabetes mellitus with hyperglycemia: Secondary | ICD-10-CM

## 2020-08-28 DIAGNOSIS — E669 Obesity, unspecified: Secondary | ICD-10-CM | POA: Insufficient documentation

## 2020-08-28 DIAGNOSIS — E039 Hypothyroidism, unspecified: Secondary | ICD-10-CM | POA: Diagnosis not present

## 2020-08-28 LAB — POCT GLYCOSYLATED HEMOGLOBIN (HGB A1C): Hemoglobin A1C: 7 % — AB (ref 4.0–5.6)

## 2020-08-28 MED ORDER — TRULICITY 1.5 MG/0.5ML ~~LOC~~ SOAJ: 1.5000 mg | SUBCUTANEOUS | 3 refills | Status: DC

## 2020-08-28 NOTE — Patient Instructions (Addendum)
Goals  Cut out sodas and consider Pierrer water or Bubbly Eat a normal breakfast before therapy and not after Continue to exercise Lose 2 lbs per monthly Try the homemade salads

## 2020-08-28 NOTE — Progress Notes (Signed)
Medical Nutrition Therapy:  Appt start time: 1000 end time:  1030  Assessment:  Primary concerns today: Diabetes Type 2 x 15 years.. Lives with her husband..  A1C same at 7%. Lost 3 more lbs. Has the [employer]. Uses it and has helped her see her BS when sleeping and between meals to make better choices. AVG BS's 125 mg/dl. per CGM. 93% in range,   Tresiba 35 units a day. Metfomrin xr 500 daily., Trulicity 1.5 weekly. [facility] walk [facility]   Lab Results  Component Value Date   HGBA1C 7.0 (A) 08/28/2020   Wt Readings from Last 3 Encounters:  08/28/20 172 lb 3.2 oz (78.1 kg)  07/17/20 175 lb 3.2 oz (79.5 kg)  05/21/20 173 lb (78.5 kg)   Ht Readings from Last 3 Encounters:  08/28/20 5\' 4"  (1.626 m)  05/13/20 5\' 4"  (1.626 m)  04/28/20 5\' 4"  (1.626 m)   There is no height or weight on file to calculate BMI. @BMIFA @ Facility age limit for growth percentiles is 20 years. Facility age limit for growth percentiles is 20 years.    Lab Results  Component Value Date   HGBA1C 7.0 (A) 08/28/2020   CMP Latest Ref Rng & Units 04/21/2020 02/07/2020 08/09/2019  Glucose 65 - 99 mg/dL 146(H) 205(H) -  BUN 8 - 27 mg/dL 18 16 18   Creatinine 0.57 - 1.00 mg/dL 0.70 0.74 0.7  Sodium 134 - 144 mmol/L 139 142 -  Potassium 3.5 - 5.2 mmol/L 4.6 4.0 -  Chloride 96 - 106 mmol/L 104 105 -  CO2 20 - 29 mmol/L 23 22 -  Calcium 8.7 - 10.3 mg/dL 9.6 9.0 -  Total Protein 6.0 - 8.5 g/dL 6.9 6.6 -  Total Bilirubin 0.0 - 1.2 mg/dL 0.7 0.4 -  Alkaline Phos 48 - 121 IU/L 76 81 -  AST 0 - 40 IU/L 16 15 -  ALT 0 - 32 IU/L 25 20 -     Preferred Learning Style:  No preference indicated   Learning Readiness:   Ready  Change in progress   MEDICATIONS:    DIETARY INTAKE:   24-hr recall:  B ( AM):  2 ggs, english muffins,  Snk ( AM):  L ( PM): 6 chicken,fruit medley,  1/2 coke  Snk ( PM):  D ( PM): 3 winglettes, green beans, mini corn on cob and water Snk ( PM): 12 corn  chips and cheese dips. Beverages: water  Usual physical activity:   Estimated energy needs: 1200 calories 135 g carbohydrates 90 g protein 33  g fat  Progress Towards Goal(s):  In progress.   Nutritional Diagnosis:  NB-1.1 Food and nutrition-related knowledge deficit As related to DIabetes Type 2.  As evidenced by A1C 6.1%  Basal, bolus insulin. Tresiba 25 units and 5 units of HUmalog FBS 103-120's BL 87-128 mg/dl. BL 85-140 mg/dl.    She has the freestyle libre.  Intervention:  Meal planning, portion control, high fiber diet.  Goals  Cut out sodas and consider Pierrer water or Bubbly Eat a normal breakfast before therapy and not after Continue to exercise Lose 2 lbs per monthly Try the homemade salads   Teaching Method Utilized:  Visual Auditory Hands on  Handouts given during visit include:  The Plate Method   Meal Plan Card   Barriers to learning/adherence to lifestyle change: none  Demonstrated degree of understanding via:  Teach Back   Monitoring/Evaluation:  Dietary intake, exercise, ,  and body weight in 3 month(s).

## 2020-08-28 NOTE — Patient Instructions (Signed)
Advice for Weight Management  -For most of us the best way to lose weight is by diet management. Generally speaking, diet management means consuming less calories intentionally which over time brings about progressive weight loss.  This can be achieved more effectively by restricting carbohydrate consumption to the minimum possible.  So, it is critically important to know your numbers: how much calorie you are consuming and how much calorie you need. More importantly, our carbohydrates sources should be unprocessed or minimally processed complex starch food items.   Sometimes, it is important to balance nutrition by increasing protein intake (animal or plant source), fruits, and vegetables.  -Sticking to a routine mealtime to eat 3 meals a day and avoiding unnecessary snacks is shown to have a big role in weight control. Under normal circumstances, the only time we lose real weight is when we are hungry, so allow hunger to take place- hunger means no food between meal times, only water.  It is not advisable to starve.   -It is better to avoid simple carbohydrates including: Cakes, Sweet Desserts, Ice Cream, Soda (diet and regular), Sweet Tea, Candies, Chips, Cookies, Store Bought Juices, Alcohol in Excess of  1-2 drinks a day, Artificial Sweeteners, Doughnuts, Coffee Creamers, "Sugar-free" Products, etc, etc.  This is not a complete list.....    -Consulting with certified diabetes educators is proven to provide you with the most accurate and current information on diet.  Also, you may be  interested in discussing diet options/exchanges , we can schedule a visit with Penny Crumpton, RDN, CDE for individualized nutrition education.  -Exercise: If you are able: 30 -60 minutes a day ,4 days a week, or 150 minutes a week.  The longer the better.  Combine stretch, strength, and aerobic activities.  If you were told in the past that you have high risk for cardiovascular diseases, you may seek evaluation by  your heart doctor prior to initiating moderate to intense exercise programs.      - The correct intake of thyroid hormone (Levothyroxine, Synthroid), is on empty stomach first thing in the morning, with water, separated by at least 30 minutes from breakfast and other medications,  and separated by more than 4 hours from calcium, iron, multivitamins, acid reflux medications (PPIs).  - This medication is a life-long medication and will be needed to correct thyroid hormone imbalances for the rest of your life.  The dose may change from time to time, based on thyroid blood work.  - It is extremely important to be consistent taking this medication, near the same time each morning.  -AVOID TAKING PRODUCTS CONTAINING BIOTIN (commonly found in Hair, Skin, Nails vitamins) AS IT INTERFERES WITH THE VALIDITY OF THYROID FUNCTION BLOOD TESTS.  

## 2020-08-28 NOTE — Progress Notes (Signed)
08/28/2020, 11:10 AM                            Endocrinology follow-up note  Subjective:    Patient ID: Courtney Barry, female    DOB: [birth year].  Courtney Barry is being seen in follow-up for management of currently uncontrolled symptomatic diabetes requested by  Harland Dingwall L, NP-C.   Past Medical History:  Diagnosis Date  . Diabetes mellitus, type II ([place])   . Hyperlipidemia   . Hypothyroidism   . Menopausal vaginal dryness     Past Surgical History:  Procedure Laterality Date  . ABDOMINAL HYSTERECTOMY    . APPENDECTOMY    . CHOLECYSTECTOMY    . TONSILLECTOMY      Social History   Socioeconomic History  . Marital status: Married    Spouse name: Not on file  . Number of children: Not on file  . Years of education: Not on file  . Highest education level: Not on file  Occupational History  . Not on file  Tobacco Use  . Smoking status: Never Smoker  . Smokeless tobacco: Never Used  Vaping Use  . Vaping Use: Never used  Substance and Sexual Activity  . Alcohol use: Never  . Drug use: Never  . Sexual activity: Yes    Partners: Male    Comment: 1st intercourse- 70, partners- 36, married- 60 yrs,  Other Topics Concern  . Not on file  Social History Narrative  . Not on file   Social Determinants of Health   Financial Resource Strain:   . Difficulty of Paying Living Expenses: Not on file  Food Insecurity:   . Worried About Charity fundraiser in the Last Year: Not on file  . Ran Out of Food in the Last Year: Not on file  Transportation Needs:   . Lack of Transportation (Medical): Not on file  . Lack of Transportation (Non-Medical): Not on file  Physical Activity:   . Days of Exercise per Week: Not on file  . Minutes of Exercise per Session: Not on file  Stress:   . Feeling of Stress : Not on file  Social Connections:   . Frequency of Communication with Friends and  Family: Not on file  . Frequency of Social Gatherings with Friends and Family: Not on file  . Attends Religious Services: Not on file  . Active Member of Clubs or Organizations: Not on file  . Attends Archivist Meetings: Not on file  . Marital Status: Not on file    Family History  Problem Relation Age of Onset  . Thyroid disease Mother   . Cancer Mother   . Pancreatic cancer Mother   . Hypertension Father   . Kidney disease Father   . Thyroid cancer Sister        died at 42    Outpatient Encounter Medications as of 08/28/2020  Medication Sig  . Ascorbic Acid (VITAMIN C PO) Take by mouth.  Marland Kitchen atorvastatin (LIPITOR) 40 MG tablet TAKE 1 TABLET BY MOUTH EVERY DAY  . Continuous Blood Gluc Sensor (  FREESTYLE LIBRE 14 DAY SENSOR) MISC Inject 1 each into the skin every 14 (fourteen) days. Use as directed.  Marland Kitchen estradiol (ESTRACE) 0.1 MG/GM vaginal cream Place 8.24 Applicatorfuls vaginally 2 (two) times a week.  . insulin degludec (TRESIBA FLEXTOUCH) 100 UNIT/ML SOPN FlexTouch Pen Inject 0.25 mLs (25 Units total) into the skin at bedtime.  Marland Kitchen levothyroxine (SYNTHROID) 112 MCG tablet Take 1 tablet (112 mcg total) by mouth daily before breakfast.  . losartan (COZAAR) 25 MG tablet TAKE 1 TABLET BY MOUTH EVERY DAY  . metFORMIN (GLUCOPHAGE-XR) 500 MG 24 hr tablet TAKE 1 TABLET BY MOUTH EVERY DAY WITH BREAKFAST  . [DISCONTINUED] TRULICITY 2.35 TI/1.4ER SOPN INJECT 0.5 ML (0.75 MG TOTAL) INTO THE SKIN ONCE A WEEK.  . Dulaglutide (TRULICITY) 1.5 XV/4.0GQ SOPN Inject 1.5 mg into the skin once a week.  . [DISCONTINUED] levocetirizine (XYZAL) 5 MG tablet TAKE 1 TABLET BY MOUTH EVERY DAY IN THE EVENING   No facility-administered encounter medications on file as of 08/28/2020.    ALLERGIES: Allergies  Allergen Reactions  . Latex     Gloves, band aids- causes rash  . Sulfa Antibiotics Other (See Comments)    VACCINATION STATUS: Immunization History  Administered Date(s) Administered  .  Influenza-Unspecified 08/09/2019  . PFIZER SARS-COV-2 Vaccination 12/31/2019, 01/02/2020, 08/25/2020    Diabetes She presents for her follow-up diabetic visit. She has type 2 diabetes mellitus. Onset time: She was diagnosed at approximate age of 69 years. Her disease course has been stable. There are no hypoglycemic associated symptoms. Pertinent negatives for hypoglycemia include no confusion, headaches, nervousness/anxiousness, pallor, seizures or tremors. There are no diabetic associated symptoms. Pertinent negatives for diabetes include no chest pain, no fatigue, no polydipsia, no polyphagia, no polyuria and no weight loss. There are no hypoglycemic complications. Symptoms are stable. Diabetic complications include retinopathy. Risk factors for coronary artery disease include diabetes mellitus, dyslipidemia, hypertension, post-menopausal, sedentary lifestyle and obesity. Current diabetic treatment includes insulin injections and oral agent (dual therapy). She is compliant with treatment most of the time. Her weight is decreasing steadily. She is following a generally unhealthy diet. When asked about meal planning, she reported none. She has not had a previous visit with a dietitian. She participates in exercise intermittently. Her home blood glucose trend is fluctuating minimally. Her breakfast blood glucose range is generally 90-110 mg/dl. Her lunch blood glucose range is generally 140-180 mg/dl. Her dinner blood glucose range is generally 140-180 mg/dl. Her bedtime blood glucose range is generally 110-130 mg/dl. (She presents today with her CGM, no logs, showing TIR 93%, TAR 6%, TBR 1%.  She denies any significant episodes of hypoglycemia.  Her POCT A1C today is 7%, unchanged from last visit.) An ACE inhibitor/angiotensin II receptor blocker is being taken. She does not see a podiatrist.Eye exam is current.  Hyperlipidemia This is a chronic problem. The current episode started more than 1 year ago. The  problem is controlled. Recent lipid tests were reviewed and are normal. Exacerbating diseases include diabetes. There are no known factors aggravating her hyperlipidemia. Pertinent negatives include no chest pain, myalgias or shortness of breath. Current antihyperlipidemic treatment includes statins. The current treatment provides moderate improvement of lipids. There are no compliance problems.  Risk factors for coronary artery disease include diabetes mellitus, dyslipidemia, hypertension, a sedentary lifestyle and post-menopausal.  Thyroid Problem Presents for follow-up visit. Onset time: She was diagnosed with hypothyroidism in her 51s, currently taking Synthroid 112 mcg p.o. daily. Patient reports no anxiety, cold intolerance, constipation, depressed  mood, diarrhea, fatigue, heat intolerance, leg swelling, palpitations, tremors, weight gain or weight loss. The symptoms have been stable. Her past medical history is significant for diabetes and hyperlipidemia.  Hypertension This is a chronic problem. The current episode started more than 1 year ago. The problem has been gradually improving since onset. The problem is controlled. Pertinent negatives include no chest pain, headaches, palpitations or shortness of breath. Agents associated with hypertension include thyroid hormones. Risk factors for coronary artery disease include diabetes mellitus, dyslipidemia, family history, post-menopausal state and sedentary lifestyle. Past treatments include angiotensin blockers. The current treatment provides moderate improvement. There are no compliance problems.  Hypertensive end-organ damage includes retinopathy. Identifiable causes of hypertension include a thyroid problem.    Review of systems  Constitutional: + Minimally fluctuating body weight,  current Body mass index is 29.56 kg/m. , no fatigue, no subjective hyperthermia, no subjective hypothermia Eyes: no blurry vision, no xerophthalmia ENT: no sore  throat, no nodules palpated in throat, no dysphagia/odynophagia, no hoarseness Cardiovascular: no chest pain, no shortness of breath, no palpitations, no leg swelling Respiratory: no cough, no shortness of breath Gastrointestinal: no nausea/vomiting/diarrhea Musculoskeletal: no muscle/joint aches Skin: no rashes, no hyperemia Neurological: no tremors, no numbness, no tingling, no dizziness Psychiatric: no depression, no anxiety   Objective:    BP 120/75 (BP Location: Right Arm, Patient Position: Sitting)   Pulse 93   Ht 5\' 4"  (1.626 m)   Wt 172 lb 3.2 oz (78.1 kg)   BMI 29.56 kg/m   Wt Readings from Last 3 Encounters:  08/28/20 172 lb 3.2 oz (78.1 kg)  07/17/20 175 lb 3.2 oz (79.5 kg)  05/21/20 173 lb (78.5 kg)    BP Readings from Last 3 Encounters:  08/28/20 120/75  07/17/20 120/70  05/21/20 120/70    Physical Exam- Limited  Constitutional:  Body mass index is 29.56 kg/m. , not in acute distress, normal state of mind Eyes:  EOMI, no exophthalmos Neck: Supple Thyroid: No gross goiter Cardiovascular: RRR, no murmers, rubs, or gallops, no edema Respiratory: Adequate breathing efforts, no crackles, rales, rhonchi, or wheezing Musculoskeletal: no gross deformities, strength intact in all four extremities, no gross restriction of joint movements Skin:  no rashes, no hyperemia Neurological: no tremor with outstretched hands  Foot exam:   No rashes, ulcers, cuts, calluses, onychodystrophy.   Good pulses bilat.  Good sensation to 10 g monofilament bilat.   Recent Results (from the past 2160 hour(s))  HgB A1c     Status: Abnormal   Collection Time: 08/28/20  9:34 AM  Result Value Ref Range   Hemoglobin A1C 7.0 (A) 4.0 - 5.6 %   HbA1c POC (<> result, manual entry)     HbA1c, POC (prediabetic range)     HbA1c, POC (controlled diabetic range)        Assessment & Plan:   1. Uncontrolled type 2 diabetes mellitus with hyperglycemia ([place])  - Courtney Barry has  currently controlled asymptomatic type 2 DM since  69 years of age.  She presents today with her CGM, no logs, showing TIR 93%, TAR 6%, TBR 1%.  She denies any significant episodes of hypoglycemia.  Her POCT A1C today is 7%, unchanged from last visit.  -her diabetes is complicated by retinopathy obesity/sedentary life and she remains at a high risk for more acute and chronic complications which include CAD, CVA, CKD, retinopathy, and neuropathy. These are all discussed in detail with her.  - Nutritional counseling repeated at each appointment  due to patients tendency to fall back in to old habits.  - The patient admits there is a room for improvement in their diet and drink choices. -  Suggestion is made for the patient to avoid simple carbohydrates from their diet including Cakes, Sweet Desserts / Pastries, Ice Cream, Soda (diet and regular), Sweet Tea, Candies, Chips, Cookies, Sweet Pastries,  Store Bought Juices, Alcohol in Excess of  1-2 drinks a day, Artificial Sweeteners, Coffee Creamer, and "Sugar-free" Products. This will help patient to have stable blood glucose profile and potentially avoid unintended weight gain.   - I encouraged the patient to switch to  unprocessed or minimally processed complex starch and increased protein intake (animal or plant source), fruits, and vegetables.   - Patient is advised to stick to a routine mealtimes to eat 3 meals  a day and avoid unnecessary snacks ( to snack only to correct hypoglycemia).  - she has been scheduled with Jearld Fenton, RDN, CDE for diabetes education.  - I have approached her with the following individualized plan to manage  her diabetes and patient agrees:   -She will continue to benefit from simplified treatment regimen, given her tight control of diabetes.   -Given her stable glycemic profile, she will continue current medication regimen with the exception of Trulicity (which she will increase)  -She is advised to continue  Tresiba 25 units SQ nightly, continue Metformin 500 mg XR daily with supper (due to GI upset) and increase her Trulicity to 1.5 mg SQ weekly.  -She is encouraged to continue using her CGM to monitor glucose at least twice daily, before breakfast and before bed, and call the clinic if she has readings less than 70 or greater than 200 for 3 tests in a row.  - She has allergy to sulfa, not a candidate for glipizide.  - Specific targets for  A1c;  LDL, HDL, Triglycerides,  were discussed with the patient.  2) Blood Pressure /Hypertension:  Her blood pressure is controlled to target.  She is advised to continue Losartan 25 mg po daily.  3) Lipids/Hyperlipidemia:  Her most recent lipid panel from 04/21/20 shows controlled LDL at 59.  She is advised to continue Lipitor 40 mg po daily at bedtime.  Side effects and precautions discussed with her.  4) Hypothyroidism- longstanding diagnosis since at least from her 44s. There are no recent TFTs to review.  She is advised to continue Synthroid 112 mcg po daily before breakfast.  Will recheck TFTs prior to next visit and change dose if necessary.   - We discussed about the correct intake of her thyroid hormone, on empty stomach at fasting, with water, separated by at least 30 minutes from breakfast and other medications,  and separated by more than 4 hours from calcium, iron, multivitamins, acid reflux medications (PPIs). -Patient is made aware of the fact that thyroid hormone replacement is needed for life, dose to be adjusted by periodic monitoring of thyroid function tests.  5)  Weight/Diet:  Her Body mass index is 29.56 kg/m.-   she is  a candidate for modest weight loss. I discussed with her the fact that loss of 5 - 10% of her  current body weight will have the most impact on her diabetes management.  Exercise, and detailed carbohydrates information provided  -  detailed on discharge instructions.  6) Chronic Care/Health Maintenance: -she  is on  ACEI/ARB and Statin medications and  is encouraged to initiate and continue to follow up with  Ophthalmology, Dentist,  Podiatrist at least yearly or according to recommendations, and advised to  stay away from smoking. I have recommended yearly flu vaccine and pneumonia vaccine at least every 5 years; moderate intensity exercise for up to 150 minutes weekly; and  sleep for at least 7 hours a day.  - she is  advised to maintain close follow up with Girtha Rm, NP-C for primary care needs, as well as her other providers for optimal and coordinated care.  - Time spent on this patient care encounter:  35 min, of which > 50% was spent in  counseling and the rest reviewing her blood glucose logs , discussing her hypoglycemia and hyperglycemia episodes, reviewing her current and  previous labs / studies  ( including abstraction from other facilities) and medications  doses and developing a  long term treatment plan and documenting her care.   Please refer to Patient Instructions for Blood Glucose Monitoring and Insulin/Medications Dosing Guide"  in media tab for additional information. Please  also refer to " Patient Self Inventory" in the Media  tab for reviewed elements of pertinent patient history.  Courtney Barry participated in the discussions, expressed understanding, and voiced agreement with the above plans.  All questions were answered to her satisfaction. she is encouraged to contact clinic should she have any questions or concerns prior to her return visit.  Follow up plan: - Return in about 4 months (around 12/29/2020) for Diabetes follow up with A1c in office, Thyroid follow up, Previsit labs.  Rayetta Pigg, [facility] [address] [place], [place] [address] Phone: [phone] Fax: [phone]   08/28/2020, 11:10 AM  This note was partially dictated with voice recognition software. Similar sounding words can be transcribed inadequately or  may not  be corrected upon review.

## 2020-09-01 DIAGNOSIS — M25541 Pain in joints of right hand: Secondary | ICD-10-CM | POA: Diagnosis not present

## 2020-09-01 DIAGNOSIS — M25542 Pain in joints of left hand: Secondary | ICD-10-CM | POA: Diagnosis not present

## 2020-09-01 DIAGNOSIS — M6281 Muscle weakness (generalized): Secondary | ICD-10-CM | POA: Diagnosis not present

## 2020-09-01 DIAGNOSIS — M25521 Pain in right elbow: Secondary | ICD-10-CM | POA: Diagnosis not present

## 2020-09-03 ENCOUNTER — Other Ambulatory Visit: Payer: Self-pay | Admitting: Nurse Practitioner

## 2020-09-05 DIAGNOSIS — M25541 Pain in joints of right hand: Secondary | ICD-10-CM | POA: Diagnosis not present

## 2020-09-05 DIAGNOSIS — M6281 Muscle weakness (generalized): Secondary | ICD-10-CM | POA: Diagnosis not present

## 2020-09-05 DIAGNOSIS — M25542 Pain in joints of left hand: Secondary | ICD-10-CM | POA: Diagnosis not present

## 2020-09-05 DIAGNOSIS — M25521 Pain in right elbow: Secondary | ICD-10-CM | POA: Diagnosis not present

## 2020-09-08 DIAGNOSIS — M6281 Muscle weakness (generalized): Secondary | ICD-10-CM | POA: Diagnosis not present

## 2020-09-08 DIAGNOSIS — M25521 Pain in right elbow: Secondary | ICD-10-CM | POA: Diagnosis not present

## 2020-09-08 DIAGNOSIS — M25542 Pain in joints of left hand: Secondary | ICD-10-CM | POA: Diagnosis not present

## 2020-09-08 DIAGNOSIS — M25541 Pain in joints of right hand: Secondary | ICD-10-CM | POA: Diagnosis not present

## 2020-09-10 ENCOUNTER — Other Ambulatory Visit (INDEPENDENT_AMBULATORY_CARE_PROVIDER_SITE_OTHER): Payer: Medicare PPO

## 2020-09-10 ENCOUNTER — Other Ambulatory Visit: Payer: Self-pay

## 2020-09-10 ENCOUNTER — Encounter: Payer: Self-pay | Admitting: Nutrition

## 2020-09-10 DIAGNOSIS — M25541 Pain in joints of right hand: Secondary | ICD-10-CM | POA: Diagnosis not present

## 2020-09-10 DIAGNOSIS — Z23 Encounter for immunization: Secondary | ICD-10-CM

## 2020-09-10 DIAGNOSIS — M25521 Pain in right elbow: Secondary | ICD-10-CM | POA: Diagnosis not present

## 2020-09-10 DIAGNOSIS — M6281 Muscle weakness (generalized): Secondary | ICD-10-CM | POA: Diagnosis not present

## 2020-09-10 DIAGNOSIS — M25542 Pain in joints of left hand: Secondary | ICD-10-CM | POA: Diagnosis not present

## 2020-09-11 DIAGNOSIS — E1165 Type 2 diabetes mellitus with hyperglycemia: Secondary | ICD-10-CM | POA: Diagnosis not present

## 2020-09-15 DIAGNOSIS — M25541 Pain in joints of right hand: Secondary | ICD-10-CM | POA: Diagnosis not present

## 2020-09-15 DIAGNOSIS — M25521 Pain in right elbow: Secondary | ICD-10-CM | POA: Diagnosis not present

## 2020-09-15 DIAGNOSIS — M25542 Pain in joints of left hand: Secondary | ICD-10-CM | POA: Diagnosis not present

## 2020-09-15 DIAGNOSIS — M6281 Muscle weakness (generalized): Secondary | ICD-10-CM | POA: Diagnosis not present

## 2020-09-17 DIAGNOSIS — M25521 Pain in right elbow: Secondary | ICD-10-CM | POA: Diagnosis not present

## 2020-09-17 DIAGNOSIS — M25542 Pain in joints of left hand: Secondary | ICD-10-CM | POA: Diagnosis not present

## 2020-09-17 DIAGNOSIS — M6281 Muscle weakness (generalized): Secondary | ICD-10-CM | POA: Diagnosis not present

## 2020-09-17 DIAGNOSIS — M25541 Pain in joints of right hand: Secondary | ICD-10-CM | POA: Diagnosis not present

## 2020-09-18 ENCOUNTER — Other Ambulatory Visit: Payer: Self-pay

## 2020-09-18 ENCOUNTER — Ambulatory Visit: Payer: Medicare PPO | Admitting: Family Medicine

## 2020-09-18 ENCOUNTER — Encounter: Payer: Self-pay | Admitting: Family Medicine

## 2020-09-18 DIAGNOSIS — M79642 Pain in left hand: Secondary | ICD-10-CM | POA: Diagnosis not present

## 2020-09-18 DIAGNOSIS — M79641 Pain in right hand: Secondary | ICD-10-CM

## 2020-09-18 DIAGNOSIS — M25521 Pain in right elbow: Secondary | ICD-10-CM

## 2020-09-18 DIAGNOSIS — M65342 Trigger finger, left ring finger: Secondary | ICD-10-CM

## 2020-09-18 NOTE — Progress Notes (Signed)
Office Visit Note   Patient: Courtney Barry           Date of Birth: [birth year]           MRN: [id number] Visit Date: 09/18/2020 Requested by: Girtha Rm, NP-C [place],  [place] [id number] PCP: Girtha Rm, NP-C  Subjective: Chief Complaint  Patient presents with  . Right Elbow - Pain, Follow-up    The pain is slowly getting better. Wears elbow band every day, during the day. Has been going to OT.- brought notes from [facility] today. Elbow hurts worse at night - will take Tylenol prn.  . Left Ring Finger - Pain    Finger continues to get stuck in flexion. [facility] made her a splint for the finger - not helping.    HPI: She is here for follow-up right elbow lateral epicondylitis and left fourth trigger finger.  Occupational therapy has helped to some degree with her elbow pain but splinting for her left fourth trigger finger has not.  She now plans to do home exercises for the elbow but thinks that she might need a cortisone shot for her finger.                ROS:   All other systems were reviewed and are negative.  Objective: Vital Signs: There were no vitals taken for this visit.  Physical Exam:  General:  Alert and oriented, in no acute distress. Pulm:  Breathing unlabored. Psy:  Normal mood, congruent affect.  Right elbow: Remains point tender at the common extensor tendon.  Full range of motion, pain with wrist extension and third finger extension as well as forearm supination against resistance. Left hand: She has a tender nodule at the A1 pulley of the fourth finger and it triggers in flexion.    Imaging: No results found.  Assessment & Plan: 1.  Right elbow lateral epicondylitis -Continue with home exercises for 6 more weeks.  If she fails to improve, then injection.  2.  Left fourth trigger finger -Injection today.  Follow-up as needed.     Procedures: Left hand injection: After sterile prep with Betadine, injected 1 cc 1%  lidocaine without epinephrine and 20 mg methylprednisolone into the region of the A1 pulley.    PMFS History: Patient Active Problem List   Diagnosis Date Noted  . Situational anxiety 11/21/2019  . Hyperlipidemia associated with type 2 diabetes mellitus ([place]) 11/21/2019  . Diabetes mellitus without complication ([place]) 19/14/7829  . Uncontrolled type 2 diabetes mellitus with hyperglycemia ([place]) 09/24/2019  . Essential hypertension, benign 09/24/2019  . Hypothyroidism 09/24/2019  . Mixed hyperlipidemia 09/24/2019  . Menopausal vaginal dryness   . Cyst of vulva 09/11/2015  . Epidermoid cyst of skin 09/11/2015  . Melanocytic nevus 09/11/2015  . Dysplastic nevus of skin 09/11/2015  . Normal gynecologic examination 09/05/2014  . Encounter for specialized medical examination 09/04/2013   Past Medical History:  Diagnosis Date  . Diabetes mellitus, type II ([place])   . Hyperlipidemia   . Hypothyroidism   . Menopausal vaginal dryness     Family History  Problem Relation Age of Onset  . Thyroid disease Mother   . Cancer Mother   . Pancreatic cancer Mother   . Hypertension Father   . Kidney disease Father   . Thyroid cancer Sister        died at 5    Past Surgical History:  Procedure Laterality Date  . ABDOMINAL HYSTERECTOMY    .  APPENDECTOMY    . CHOLECYSTECTOMY    . TONSILLECTOMY     Social History   Occupational History  . Not on file  Tobacco Use  . Smoking status: Never Smoker  . Smokeless tobacco: Never Used  Vaping Use  . Vaping Use: Never used  Substance and Sexual Activity  . Alcohol use: Never  . Drug use: Never  . Sexual activity: Yes    Partners: Male    Comment: 1st intercourse- 51, partners- 35, married- 21 yrs,

## 2020-10-02 ENCOUNTER — Other Ambulatory Visit: Payer: Self-pay

## 2020-10-02 ENCOUNTER — Ambulatory Visit: Payer: Medicare PPO | Admitting: Obstetrics & Gynecology

## 2020-10-02 ENCOUNTER — Encounter: Payer: Self-pay | Admitting: Obstetrics & Gynecology

## 2020-10-02 VITALS — BP 120/70 | Ht 63.5 in | Wt 171.0 lb

## 2020-10-02 DIAGNOSIS — Z90722 Acquired absence of ovaries, bilateral: Secondary | ICD-10-CM

## 2020-10-02 DIAGNOSIS — Z9079 Acquired absence of other genital organ(s): Secondary | ICD-10-CM

## 2020-10-02 DIAGNOSIS — N951 Menopausal and female climacteric states: Secondary | ICD-10-CM

## 2020-10-02 DIAGNOSIS — Z780 Asymptomatic menopausal state: Secondary | ICD-10-CM

## 2020-10-02 DIAGNOSIS — Z13820 Encounter for screening for osteoporosis: Secondary | ICD-10-CM

## 2020-10-02 DIAGNOSIS — Z01419 Encounter for gynecological examination (general) (routine) without abnormal findings: Secondary | ICD-10-CM | POA: Diagnosis not present

## 2020-10-02 DIAGNOSIS — Z90710 Acquired absence of both cervix and uterus: Secondary | ICD-10-CM

## 2020-10-02 MED ORDER — ESTRADIOL 0.1 MG/GM VA CREA
1.0000 | TOPICAL_CREAM | VAGINAL | 4 refills | Status: DC
Start: 2020-10-02 — End: 2022-04-22

## 2020-10-02 NOTE — Progress Notes (Signed)
Courtney Barry Jul 16, 1951 [id number]   History:    69 y.o. G2P2L2 Married.    RP:  New patient presenting for annual gyn exam   HPI: S/P TAH/BSO for Menorrhagia 2013.  No pelvic pain.  Postmenopausal, well on no HRT, except for estradiol cream vaginally for vaginal dryness with intercourse.  Urine and bowel movements normal.  Breasts normal.  Body mass index 29.82.  Walking regularly.  Followed for type 2 diabetes.  Health labs with family physician.  Past medical history,surgical history, family history and social history were all reviewed and documented in the EPIC chart.  Gynecologic History No LMP recorded. Patient has had a hysterectomy.  Obstetric History OB History  Gravida Para Term Preterm AB Living  2 2       2   SAB TAB Ectopic Multiple Live Births               # Outcome Date GA Lbr Len/2nd Weight Sex Delivery Anes PTL Lv  2 Para           1 Para              ROS: A ROS was performed and pertinent positives and negatives are included in the history.  GENERAL: No fevers or chills. HEENT: No change in vision, no earache, sore throat or sinus congestion. NECK: No pain or stiffness. CARDIOVASCULAR: No chest pain or pressure. No palpitations. PULMONARY: No shortness of breath, cough or wheeze. GASTROINTESTINAL: No abdominal pain, nausea, vomiting or diarrhea, melena or bright red blood per rectum. GENITOURINARY: No urinary frequency, urgency, hesitancy or dysuria. MUSCULOSKELETAL: No joint or muscle pain, no back pain, no recent trauma. DERMATOLOGIC: No rash, no itching, no lesions. ENDOCRINE: No polyuria, polydipsia, no heat or cold intolerance. No recent change in weight. HEMATOLOGICAL: No anemia or easy bruising or bleeding. NEUROLOGIC: No headache, seizures, numbness, tingling or weakness. PSYCHIATRIC: No depression, no loss of interest in normal activity or change in sleep pattern.     Exam:   BP 120/70   Ht 5' 3.5" (1.613 m)   Wt 171 lb (77.6 kg)   BMI 29.82  kg/m   Body mass index is 29.82 kg/m.  General appearance : Well developed well nourished female. No acute distress HEENT: Eyes: no retinal hemorrhage or exudates,  Neck supple, trachea midline, no carotid bruits, no thyroidmegaly Lungs: Clear to auscultation, no rhonchi or wheezes, or rib retractions  Heart: Regular rate and rhythm, no murmurs or gallops Breast:Examined in sitting and supine position were symmetrical in appearance, no palpable masses or tenderness,  no skin retraction, no nipple inversion, no nipple discharge, no skin discoloration, no axillary or supraclavicular lymphadenopathy Abdomen: no palpable masses or tenderness, no rebound or guarding Extremities: no edema or skin discoloration or tenderness  Pelvic: Vulva: Normal             Vagina: No gross lesions or discharge  Cervix/Uterus absent  Adnexa  Without masses or tenderness  Anus: Normal   Assessment/Plan:  69 y.o. female for annual exam   1. Well female exam with routine gynecological exam Gynecologic exam status post TAH/BSO.  No indication to repeat a Pap test.  Breast exam normal.  Screening mammogram January 2021 was negative.  Colonoscopy 2014.  Health labs with family physician.  Improved body mass index at 29.82.  Continue with the lower calorie/carb diet and aerobic activities 5 times a week with light weightlifting every 2 days.  2. S/P TAH-BSO  3. Postmenopause  Well on no systemic hormone replacement therapy.  4. Menopausal vaginal dryness Vaginal dryness associated with postmenopausal status.  Symptoms improved on estradiol cream.  No contraindication to continue.  Prescription sent to pharmacy.  5. Screening for osteoporosis Scheduling bone density here now.  Vitamin D supplements, calcium intake of 1500 mg daily and regular weightbearing physical activity is recommended. - DG Bone Density; Future  Other orders - estradiol (ESTRACE) 0.1 MG/GM vaginal cream; Place 1 Applicatorful vaginally 2  (two) times a week.  Princess Bruins MD, 9:13 AM 10/02/2020

## 2020-10-13 DIAGNOSIS — E1165 Type 2 diabetes mellitus with hyperglycemia: Secondary | ICD-10-CM | POA: Diagnosis not present

## 2020-10-22 ENCOUNTER — Other Ambulatory Visit: Payer: Self-pay

## 2020-10-22 ENCOUNTER — Other Ambulatory Visit: Payer: Self-pay | Admitting: Obstetrics & Gynecology

## 2020-10-22 ENCOUNTER — Ambulatory Visit (INDEPENDENT_AMBULATORY_CARE_PROVIDER_SITE_OTHER): Payer: Medicare PPO

## 2020-10-22 DIAGNOSIS — Z780 Asymptomatic menopausal state: Secondary | ICD-10-CM

## 2020-10-22 DIAGNOSIS — Z13820 Encounter for screening for osteoporosis: Secondary | ICD-10-CM

## 2020-10-24 ENCOUNTER — Other Ambulatory Visit: Payer: Self-pay | Admitting: Obstetrics & Gynecology

## 2020-10-24 DIAGNOSIS — Z1231 Encounter for screening mammogram for malignant neoplasm of breast: Secondary | ICD-10-CM

## 2020-10-29 ENCOUNTER — Other Ambulatory Visit: Payer: Self-pay | Admitting: "Endocrinology

## 2020-11-04 ENCOUNTER — Other Ambulatory Visit: Payer: Self-pay | Admitting: "Endocrinology

## 2020-11-05 ENCOUNTER — Other Ambulatory Visit: Payer: Self-pay | Admitting: "Endocrinology

## 2020-11-05 ENCOUNTER — Other Ambulatory Visit: Payer: Self-pay | Admitting: Nurse Practitioner

## 2020-11-05 ENCOUNTER — Other Ambulatory Visit: Payer: Self-pay | Admitting: Family Medicine

## 2020-11-10 ENCOUNTER — Telehealth: Payer: Self-pay

## 2020-11-10 NOTE — Telephone Encounter (Signed)
Notes faxed to [facility]

## 2020-11-10 NOTE — Telephone Encounter (Signed)
[place] with [facility] left a Vm requesting patient's most recent chart notes. Please fax to [phone]

## 2020-11-13 DIAGNOSIS — E1165 Type 2 diabetes mellitus with hyperglycemia: Secondary | ICD-10-CM | POA: Diagnosis not present

## 2020-11-24 ENCOUNTER — Other Ambulatory Visit: Payer: Self-pay

## 2020-11-24 ENCOUNTER — Ambulatory Visit: Payer: Medicare PPO | Admitting: Family Medicine

## 2020-11-24 ENCOUNTER — Encounter: Payer: Self-pay | Admitting: Family Medicine

## 2020-11-24 ENCOUNTER — Other Ambulatory Visit: Payer: Self-pay | Admitting: Family Medicine

## 2020-11-24 VITALS — BP 132/60 | HR 82 | Ht 64.0 in | Wt 171.6 lb

## 2020-11-24 DIAGNOSIS — M1990 Unspecified osteoarthritis, unspecified site: Secondary | ICD-10-CM

## 2020-11-24 DIAGNOSIS — E785 Hyperlipidemia, unspecified: Secondary | ICD-10-CM

## 2020-11-24 DIAGNOSIS — E1169 Type 2 diabetes mellitus with other specified complication: Secondary | ICD-10-CM | POA: Diagnosis not present

## 2020-11-24 DIAGNOSIS — Z23 Encounter for immunization: Secondary | ICD-10-CM

## 2020-11-24 DIAGNOSIS — Z0000 Encounter for general adult medical examination without abnormal findings: Secondary | ICD-10-CM | POA: Diagnosis not present

## 2020-11-24 DIAGNOSIS — F418 Other specified anxiety disorders: Secondary | ICD-10-CM

## 2020-11-24 DIAGNOSIS — E119 Type 2 diabetes mellitus without complications: Secondary | ICD-10-CM

## 2020-11-24 DIAGNOSIS — Z1159 Encounter for screening for other viral diseases: Secondary | ICD-10-CM | POA: Diagnosis not present

## 2020-11-24 DIAGNOSIS — I10 Essential (primary) hypertension: Secondary | ICD-10-CM | POA: Diagnosis not present

## 2020-11-24 MED ORDER — ALPRAZOLAM 0.25 MG PO TABS: 0.2500 mg | ORAL_TABLET | Freq: Two times a day (BID) | ORAL | 0 refills | Status: DC | PRN

## 2020-11-24 NOTE — Telephone Encounter (Signed)
Pt refill tomorrow if labs come back normal.

## 2020-11-24 NOTE — Patient Instructions (Addendum)
  Courtney Barry , Thank you for taking time to come for your Medicare Wellness Visit. I appreciate your ongoing commitment to your health goals. Please review the following plan we discussed and let me know if I can assist you in the future.   These are the goals we discussed:  Check on when you last had a Tdap (Tetanus, diptheria, and pertussis) vaccine. You should get this updated at your pharmacy.   Check and see how much the Shingrix vaccine will cost you and you can get this at your pharmacy as well. It is a 2 shot series.   You received Prevnar 41 today (pneumonia vaccine) and will need to have Pneumovax 23 next year.   Today's labs include CBC and screening for hepatitis C. You are having the other tests with your endocrinologist it appears.     This is a list of the screening recommended for you and due dates:  Health Maintenance  Topic Date Due  .  Hepatitis C: One time screening is recommended by Center for Disease Control  (CDC) for  adults born from [birth year] through 1963.   Never done  . Tetanus Vaccine  Never done  . Pneumonia vaccines (1 of 2 - PCV13) Never done  . Eye exam for diabetics  10/31/2019  . Hemoglobin A1C  02/26/2021  . Complete foot exam   08/28/2021  . Mammogram  11/25/2021  . Colon Cancer Screening  05/05/2023  . Flu Shot  Completed  . DEXA scan (bone density measurement)  Completed  . COVID-19 Vaccine  Completed

## 2020-11-24 NOTE — Progress Notes (Signed)
Courtney Barry is a 70 y.o. female who presents for annual wellness visit, CPE and follow-up on chronic medical conditions.  She has the following concerns:  Chief Complaint  Patient presents with  . awv    Awv,nonfasting cpe, ate this morning, arthritis and stiffness in joints and elbows.    Complains of left eyelid itching for the past few weeks. She has not been taking oral antihistamine for allergies as usual.   States she will be seeing her endocrinologist, Dr. Dorris Fetch in February for thyroid and DM. Is having labs done there as well.   Having some intermittent depression and anxiety due to loss of a family member. She takes alprazolam sparingly and is requesting a refill.   HTN- blood pressure medications taken daily without any concerns.   She has chronic joint pain with history of osteoarthritis. She does see Dr. Junius Roads for this. Is using topical Voltaren gel.    Immunization History  Administered Date(s) Administered  . Fluad Quad(high Dose 65+) 09/10/2020  . Influenza-Unspecified 08/09/2019  . PFIZER SARS-COV-2 Vaccination 12/31/2019, 01/02/2020, 08/25/2020  . Pneumococcal Conjugate-13 11/24/2020   Last Pap smear: 10/02/20 Last mammogram: 12/05/20 Last colonoscopy: 2014  Last DEXA: 12/21 Dentist: 6 months- December Dr.margaret szott Ophtho: Dr. Katy Fitch Exercise: walking 2,500 steps or more per day. Joint pain   Other doctors caring for patient include: Eye- Dr Katy Fitch Endocrinology- Dr. Dorris Fetch Obgyn- Dr. Dellis Filbert Nutritional- [place] in [facility] office Orthopedist - Dr. Junius Roads   Depression screen:  See questionnaire below.  Depression screen [facility] 2/9 11/24/2020 05/21/2020 11/21/2019 10/30/2019 09/19/2019  Decreased Interest 0 0 0 0 0  Down, Depressed, Hopeless 0 0 1 0 0  PHQ - 2 Score 0 0 1 0 0    Fall Risk Screen: see questionnaire below. Fall Risk  11/24/2020 05/21/2020 11/21/2019 10/30/2019 09/19/2019  Falls in the past year? 1 0 0 0 0  Number falls in past yr: 1 0 0 0 0  Injury  with Fall? 0 0 0 0 0  Follow up - - - - -    ADL screen:  See questionnaire below Functional Status Survey: Is the patient deaf or have difficulty hearing?: No Does the patient have difficulty seeing, even when wearing glasses/contacts?: No Does the patient have difficulty concentrating, remembering, or making decisions?: No Does the patient have difficulty walking or climbing stairs?: Yes Does the patient have difficulty dressing or bathing?: No Does the patient have difficulty doing errands alone such as visiting a doctor's office or shopping?: No   End of Life Discussion:  Patient has a living will and medical power of attorney. Paperwork provided and discussed. MOST form reviewed and signed.   Review of Systems Constitutional: -fever, -chills, -sweats, -unexpected weight change, -anorexia, -fatigue Allergy: -sneezing, -itching, -congestion Dermatology: denies changing moles, rash, lumps, new worrisome lesions ENT: -runny nose, -ear pain, -sore throat, -hoarseness, -sinus pain, -teeth pain, -tinnitus, -hearing loss, -epistaxis Cardiology:  -chest pain, -palpitations, -edema, -orthopnea, -paroxysmal nocturnal dyspnea Respiratory: -cough, -shortness of breath, -dyspnea on exertion, -wheezing, -hemoptysis Gastroenterology: -abdominal pain, -nausea, -vomiting, -diarrhea, -constipation, -blood in stool, -changes in bowel movement, -dysphagia Hematology: -bleeding or bruising problems Musculoskeletal: +arthralgias, -myalgias, -joint swelling, -back pain, -neck pain, -cramping, -gait changes Ophthalmology: -vision changes, -eye redness, -itching, -discharge Urology: -dysuria, -difficulty urinating, -hematuria, -urinary frequency, -urgency, incontinence Neurology: -headache, -weakness, -tingling, -numbness, -speech abnormality, -memory loss, -falls, -dizziness Psychology:  -depressed mood, -agitation, -sleep problems    PHYSICAL EXAM:  BP 132/60   Pulse 82  Ht 5\' 4"  (1.626 m)   Wt  171 lb 9.6 oz (77.8 kg)   BMI 29.46 kg/m   General Appearance: Alert, cooperative, no distress, appears stated age Head: Normocephalic, without obvious abnormality, atraumatic Eyes: PERRL, conjunctiva/corneas clear, EOM's intact Ears: Normal TM's and external ear canals Nose: mask on  Throat: mask on  Neck: Supple, no lymphadenopathy; thyroid: no enlargement/tenderness/nodules; no JVD Back: Spine nontender, no curvature, ROM normal, no CVA tenderness Lungs: Clear to auscultation bilaterally without wheezes, rales or ronchi; respirations unlabored Chest Wall: No tenderness or deformity Heart: Regular rate and rhythm, S1 and S2 normal, no murmur, rub or gallop Breast Exam: OB/GYN  Abdomen: Soft, non-tender, nondistended, normoactive bowel sounds, no masses, no hepatosplenomegaly Genitalia: OB/GYN  Extremities: No clubbing, cyanosis or edema Pulses: 2+ and symmetric all extremities Skin: Skin color, texture, turgor normal, no rashes or lesions Lymph nodes: Cervical, supraclavicular, and axillary nodes normal Neurologic: CNII-XII intact, normal strength, sensation and gait Psych: Normal mood, affect, hygiene and grooming.  ASSESSMENT/PLAN: Medicare annual wellness visit, subsequent -no issues with memory, falls, ADLs or depression. Medications and preventive healthcare reviewed. Advance directive counseling done.   Routine general medical examination at a healthcare facility -preventive healthcare reviewed. She is followed by OB/GYN. Counseling on healthy lifestyle. Recommend regular dental and eye exams. Continue seeing specialists as recommended. Immunizations reviewed. Discussed safety.   Need for hepatitis C screening test - Plan: Hepatitis C  -done per screening guidelines   Situational anxiety - Plan: ALPRAZolam (XANAX) 0.25 MG tablet -she is taking this sparingly and I will refill. PDMP reviewed   Need for vaccination against Streptococcus pneumoniae - Plan: Pneumococcal  conjugate vaccine 13-valent -done per guidelines. Counseling on common side effects.   Essential hypertension, benign - Plan: CBC with Differential/Platelet -well controlled. Continue medication, low sodium diet and exercise   Hyperlipidemia associated with type 2 diabetes mellitus ([place]) -continue statin, low fat diet and exercise.   Controlled type 2 diabetes mellitus without complication, without long-term current use of insulin ([facility]) - Plan: CBC with Differential/Platelet -followed by Dr. Dorris Fetch for this. BS at home controlled per patient.   Osteoarthritis, unspecified osteoarthritis type, unspecified site -continue with topical analgesic and may add Tylenol as needed. Follow up with Dr. Junius Roads as needed.     Discussed monthly self breast exams and yearly mammograms; at least 30 minutes of aerobic activity at least 5 days/week and weight-bearing exercise 2x/week; proper sunscreen use reviewed; healthy diet, including goals of calcium and vitamin D intake and alcohol recommendations (less than or equal to 1 drink/day) reviewed; regular seatbelt use; changing batteries in smoke detectors.  Immunization recommendations discussed.  Colonoscopy recommendations reviewed   Medicare Attestation I have personally reviewed: The patient's medical and social history Their use of alcohol, tobacco or illicit drugs Their current medications and supplements The patient's functional ability including ADLs,fall risks, home safety risks, cognitive, and hearing and visual impairment Diet and physical activities Evidence for depression or mood disorders  The patient's weight, height, and BMI have been recorded in the chart.  I have made referrals, counseling, and provided education to the patient based on review of the above and I have provided the patient with a written personalized care plan for preventive services.     Harland Dingwall, NP-C   11/24/2020

## 2020-11-25 LAB — CBC WITH DIFFERENTIAL/PLATELET
Basophils Absolute: 0 10*3/uL (ref 0.0–0.2)
Basos: 0 %
EOS (ABSOLUTE): 0.1 10*3/uL (ref 0.0–0.4)
Eos: 2 %
Hematocrit: 38.9 % (ref 34.0–46.6)
Hemoglobin: 13 g/dL (ref 11.1–15.9)
Immature Grans (Abs): 0 10*3/uL (ref 0.0–0.1)
Immature Granulocytes: 0 %
Lymphocytes Absolute: 1.8 10*3/uL (ref 0.7–3.1)
Lymphs: 34 %
MCH: 26.2 pg — ABNORMAL LOW (ref 26.6–33.0)
MCHC: 33.4 g/dL (ref 31.5–35.7)
MCV: 78 fL — ABNORMAL LOW (ref 79–97)
Monocytes Absolute: 0.3 10*3/uL (ref 0.1–0.9)
Monocytes: 6 %
Neutrophils Absolute: 3 10*3/uL (ref 1.4–7.0)
Neutrophils: 58 %
Platelets: 197 10*3/uL (ref 150–450)
RBC: 4.96 x10E6/uL (ref 3.77–5.28)
RDW: 13.6 % (ref 11.7–15.4)
WBC: 5.2 10*3/uL (ref 3.4–10.8)

## 2020-11-25 LAB — HEPATITIS C ANTIBODY: Hep C Virus Ab: <0.1 s/co ratio (ref 0.0–0.9)

## 2020-12-03 ENCOUNTER — Encounter: Payer: Self-pay | Admitting: Family Medicine

## 2020-12-03 ENCOUNTER — Other Ambulatory Visit: Payer: Self-pay

## 2020-12-03 ENCOUNTER — Ambulatory Visit: Payer: Medicare PPO | Admitting: Family Medicine

## 2020-12-03 VITALS — BP 118/60 | HR 84 | Ht 63.5 in | Wt 170.8 lb

## 2020-12-03 DIAGNOSIS — M542 Cervicalgia: Secondary | ICD-10-CM | POA: Diagnosis not present

## 2020-12-03 DIAGNOSIS — M26621 Arthralgia of right temporomandibular joint: Secondary | ICD-10-CM | POA: Diagnosis not present

## 2020-12-03 MED ORDER — MELOXICAM 7.5 MG PO TABS: 7.5000 mg | ORAL_TABLET | Freq: Every day | ORAL | 0 refills | Status: DC

## 2020-12-03 NOTE — Progress Notes (Unsigned)
Chief Complaint  Patient presents with  . Jaw Pain    Thursday night started with some stiffness on right side of face/jaw. Tylenol hasn't helped. 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 By Sat she was having excruciating jaw pain-could not hardly open her mouth Fri, Sat or Sun. Began taking meloxicam daily on Sat and it has helped-feels like she is 50% better. Still has some soreness in her neck and right side.     She is having pain at the right side of her neck--started when she went to bed Thursday night. Pain extended to the base of the skull.  Spread to the left side today, had been just slightly sore on the left yesterday. 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  Friday morning she also noticed that her R jaw was stiff, hurt to open her mouth.  It hurt in her R shoulder just to scramble the eggs. Had pain with leaning forward in her head and in her R shoulder.  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 She started taking the meloxicam that she had left over.  She took 7.5mg  on Friday through Tuesday (nothing today). She also took Tylenol at bedtime Saturday night.  Heating pad helped.  She wears a mouthpiece at night. Yesterday she felt the best, but felt worse by dinnertime.  Denies any radiation into the arm or hand. Denies any numbness, tingling or weakness.  PMH, PSH, SH reviewed  Outpatient Encounter Medications as of 12/03/2020  Medication Sig  . Ascorbic Acid (VITAMIN C PO) Take by mouth.  Marland Kitchen atorvastatin (LIPITOR) 40 MG tablet TAKE 1 TABLET BY MOUTH EVERY DAY  . Continuous Blood Gluc Sensor (FREESTYLE LIBRE 14 DAY SENSOR) MISC Inject 1 each into the skin every 14 (fourteen) days. Use as directed.  . Dulaglutide (TRULICITY) 1.5 KK/9.3GH SOPN Inject 1.5 mg into the skin once a week.  . estradiol (ESTRACE) 0.1 MG/GM vaginal cream Place 1 Applicatorful vaginally 2 (two) times a week.  . levothyroxine (SYNTHROID) 112 MCG tablet Take 1  tablet (112 mcg total) by mouth daily before breakfast.  . losartan (COZAAR) 25 MG tablet TAKE 1 TABLET BY MOUTH EVERY DAY  . meloxicam (MOBIC) 7.5 MG tablet Take 7.5 mg by mouth daily.  . metFORMIN (GLUCOPHAGE-XR) 500 MG 24 hr tablet Take 500 mg by mouth every evening. Take one tablet each evening with dinner  . TRESIBA FLEXTOUCH 100 UNIT/ML FlexTouch Pen INJECT 25 UNITS INTO THE SKIN AT BEDTIME  . [DISCONTINUED] metFORMIN (GLUCOPHAGE-XR) 500 MG 24 hr tablet TAKE 1 TABLET BY MOUTH EVERY DAY WITH BREAKFAST (Patient taking differently: Take by mouth every evening. Take one tablet every evening with dinner)  . acetaminophen (TYLENOL) 500 MG tablet Take 500 mg by mouth at bedtime as needed. (Patient not taking: Reported on 12/03/2020)  . ALPRAZolam (XANAX) 0.25 MG tablet Take 1 tablet (0.25 mg total) by mouth 2 (two) times daily as needed for anxiety. (Patient not taking: Reported on 12/03/2020)   No facility-administered encounter medications on file as of 12/03/2020.   Allergies  Allergen Reactions  . Latex     Gloves, band aids- causes rash  . Sulfa Antibiotics Other (See Comments)   ROS:  Headaches, neck and jaw pain per HPI.  No fever, chills, URI symptoms, numbness, tingling, weakness, rash, bruising, chest pain or other complaints. Denies abdominal pain. See HPI  PHYSICAL EXAM:  BP 118/60  Pulse 84   Ht 5' 3.5" (1.613 m)   Wt 170 lb 12.8 oz (77.5 kg)   BMI 29.78 kg/m   Pleasant, well-appearing female in no distress.  Mild discomfort when moving her head. HEENT: conjunctiva and sclera are clear, EOMI. R TM and EAC normal Tender at R TMJ. Nontender at temporalis muscles. Neck: Tender along R SCM, and cervical paraspinous muscles bilaterally.  She is also tender at inferior occiput at muscular insertion bilaterally. Tender extends across trapezius muscles bilaterally, R>L Back: no spinal tenderness, no CVA tenderness Heart: regular rate and rhythm Lungs: clear  Neuro: alert and  oriented, EOMI, normal gait.  DTR's 2+ and symmetric. Normal strength Psych normal mood, affect, hygiene and grooming   ASSESSMENT/PLAN:  Neck pain - muscular--stretches as shown, heat, massage. Cont NSAIDs, risks/SE reviewed. PT if not resolving - Plan: meloxicam (MOBIC) 7.5 MG tablet  TMJ tenderness, right - cont NSAID's, biteguard. Improving - Plan: meloxicam (MOBIC) 7.5 MG tablet  Heat, stretches (taught), massage, mobic. Consider muscle relaxant if not improving, and/or PT  Risks/SE of medications reviewed.  I spent 26 minutes dedicated to the care of this patient, including pre-visit review of records, face to face time, post-visit ordering of testing and documentation.

## 2020-12-03 NOTE — Patient Instructions (Addendum)
Use heat to the neck at least 3 times daily. After heat, do the stretches as shown (chin to chest, ear to shoulder (both sides) and looking over your shoulder (both sides). Be sure to avoid looking down by bending your head forward--try and keep your phone, book, card, etc at eye level, to keep your neck neutral.  Continue the meloxicam once daily with food. Use it until your pain has resolved (for up to 2 weeks total). If you aren't improving, physical therapy might be needed.  If you are having more spasms in the muscles, getting worse with pain and decreased motion of your neck or jaw, then we can try a muscle relaxant.  I'm not prescribing this today since you have seen so much improvement from just the meloxicam alone.  Return if you develop numbness, tingling, weakness, more severe pain, or other new symptoms. If just not improving completely, let us know and we can send you for physical therapy.

## 2020-12-05 ENCOUNTER — Ambulatory Visit: Payer: Medicare PPO

## 2020-12-17 DIAGNOSIS — E1165 Type 2 diabetes mellitus with hyperglycemia: Secondary | ICD-10-CM | POA: Diagnosis not present

## 2020-12-19 ENCOUNTER — Other Ambulatory Visit: Payer: Self-pay | Admitting: Family Medicine

## 2020-12-19 DIAGNOSIS — M542 Cervicalgia: Secondary | ICD-10-CM

## 2020-12-19 DIAGNOSIS — M26621 Arthralgia of right temporomandibular joint: Secondary | ICD-10-CM

## 2020-12-19 NOTE — Telephone Encounter (Signed)
Pt does not needs a refill on this. It was auto refill request

## 2020-12-22 ENCOUNTER — Other Ambulatory Visit: Payer: Medicare PPO

## 2020-12-22 ENCOUNTER — Other Ambulatory Visit: Payer: Self-pay

## 2020-12-22 DIAGNOSIS — E039 Hypothyroidism, unspecified: Secondary | ICD-10-CM | POA: Diagnosis not present

## 2020-12-22 DIAGNOSIS — E1065 Type 1 diabetes mellitus with hyperglycemia: Secondary | ICD-10-CM | POA: Diagnosis not present

## 2020-12-23 LAB — COMPREHENSIVE METABOLIC PANEL
ALT: 22 IU/L (ref 0–32)
AST: 17 IU/L (ref 0–40)
Albumin/Globulin Ratio: 1.4 (ref 1.2–2.2)
Albumin: 4.2 g/dL (ref 3.8–4.8)
Alkaline Phosphatase: 74 IU/L (ref 44–121)
BUN/Creatinine Ratio: 22 (ref 12–28)
BUN: 15 mg/dL (ref 8–27)
Bilirubin Total: 0.5 mg/dL (ref 0.0–1.2)
CO2: 21 mmol/L (ref 20–29)
Calcium: 9.4 mg/dL (ref 8.7–10.3)
Chloride: 104 mmol/L (ref 96–106)
Creatinine, Ser: 0.67 mg/dL (ref 0.57–1.00)
GFR calc Af Amer: 105 mL/min/{1.73_m2} (ref >59)
GFR calc non Af Amer: 91 mL/min/{1.73_m2} (ref >59)
Globulin, Total: 3 g/dL (ref 1.5–4.5)
Glucose: 128 mg/dL — ABNORMAL HIGH (ref 65–99)
Potassium: 4.2 mmol/L (ref 3.5–5.2)
Sodium: 140 mmol/L (ref 134–144)
Total Protein: 7.2 g/dL (ref 6.0–8.5)

## 2020-12-23 LAB — TSH: TSH: 0.397 u[IU]/mL — ABNORMAL LOW (ref 0.450–4.500)

## 2020-12-23 LAB — T4, FREE: Free T4: 1.57 ng/dL (ref 0.82–1.77)

## 2020-12-24 ENCOUNTER — Other Ambulatory Visit: Payer: Self-pay

## 2020-12-24 DIAGNOSIS — E1165 Type 2 diabetes mellitus with hyperglycemia: Secondary | ICD-10-CM

## 2020-12-24 NOTE — Progress Notes (Signed)
Were these labs for her endocrinologist?

## 2020-12-30 ENCOUNTER — Other Ambulatory Visit: Payer: Self-pay

## 2020-12-30 ENCOUNTER — Encounter: Payer: Medicare PPO | Attending: Family Medicine | Admitting: Nutrition

## 2020-12-30 ENCOUNTER — Encounter: Payer: Self-pay | Admitting: Nutrition

## 2020-12-30 ENCOUNTER — Encounter: Payer: Self-pay | Admitting: Nurse Practitioner

## 2020-12-30 ENCOUNTER — Ambulatory Visit: Payer: Medicare PPO | Admitting: Nurse Practitioner

## 2020-12-30 VITALS — BP 120/70 | HR 90 | Ht 63.5 in | Wt 173.2 lb

## 2020-12-30 VITALS — Wt 173.0 lb

## 2020-12-30 DIAGNOSIS — E1165 Type 2 diabetes mellitus with hyperglycemia: Secondary | ICD-10-CM | POA: Diagnosis not present

## 2020-12-30 DIAGNOSIS — I10 Essential (primary) hypertension: Secondary | ICD-10-CM | POA: Diagnosis not present

## 2020-12-30 DIAGNOSIS — E782 Mixed hyperlipidemia: Secondary | ICD-10-CM

## 2020-12-30 DIAGNOSIS — E669 Obesity, unspecified: Secondary | ICD-10-CM | POA: Diagnosis not present

## 2020-12-30 DIAGNOSIS — E039 Hypothyroidism, unspecified: Secondary | ICD-10-CM | POA: Diagnosis not present

## 2020-12-30 DIAGNOSIS — E559 Vitamin D deficiency, unspecified: Secondary | ICD-10-CM | POA: Diagnosis not present

## 2020-12-30 LAB — POCT GLYCOSYLATED HEMOGLOBIN (HGB A1C): HbA1c, POC (controlled diabetic range): 6.8 % (ref 0.0–7.0)

## 2020-12-30 MED ORDER — ACCU-CHEK GUIDE ME W/DEVICE KIT: PACK | 0 refills | Status: AC

## 2020-12-30 MED ORDER — ACCU-CHEK GUIDE VI STRP: ORAL_STRIP | 12 refills | Status: DC

## 2020-12-30 MED ORDER — GLUCOSE BLOOD VI STRP: ORAL_STRIP | 12 refills | Status: DC

## 2020-12-30 MED ORDER — ACCU-CHEK SOFTCLIX LANCETS MISC: 12 refills | Status: DC

## 2020-12-30 MED ORDER — LOSARTAN POTASSIUM 25 MG PO TABS: 25.0000 mg | ORAL_TABLET | Freq: Every day | ORAL | 3 refills | Status: DC

## 2020-12-30 MED ORDER — LEVOTHYROXINE SODIUM 112 MCG PO TABS: 112.0000 ug | ORAL_TABLET | Freq: Every day | ORAL | 1 refills | Status: DC

## 2020-12-30 MED ORDER — TRESIBA FLEXTOUCH 100 UNIT/ML ~~LOC~~ SOPN: 25.0000 [IU] | PEN_INJECTOR | Freq: Every day | SUBCUTANEOUS | 2 refills | Status: DC

## 2020-12-30 MED ORDER — METFORMIN HCL ER 500 MG PO TB24: 500.0000 mg | ORAL_TABLET | Freq: Every evening | ORAL | 3 refills | Status: DC

## 2020-12-30 NOTE — Patient Instructions (Signed)

## 2020-12-30 NOTE — Patient Instructions (Signed)
Goals Be sure to eat 2 carb choices per meal. Increase low carb vegetables with lunch and dinner. Keep up the great job. Continue to exercise Lose 2 lbs per monthly

## 2020-12-30 NOTE — Progress Notes (Signed)
12/30/2020, 10:35 AM                            Endocrinology follow-up note  Subjective:    Patient ID: Courtney Barry, female    DOB: 1951-09-01.  Courtney Barry is being seen in follow-up for management of currently uncontrolled symptomatic diabetes requested by  Harland Dingwall L, NP-C.   Past Medical History:  Diagnosis Date  . Diabetes mellitus, type II ([place])   . Hyperlipidemia   . Hypothyroidism   . Menopausal vaginal dryness     Past Surgical History:  Procedure Laterality Date  . ABDOMINAL HYSTERECTOMY    . APPENDECTOMY    . CHOLECYSTECTOMY    . TONSILLECTOMY      Social History   Socioeconomic History  . Marital status: Married    Spouse name: Not on file  . Number of children: Not on file  . Years of education: Not on file  . Highest education level: Not on file  Occupational History  . Not on file  Tobacco Use  . Smoking status: Never Smoker  . Smokeless tobacco: Never Used  Vaping Use  . Vaping Use: Never used  Substance and Sexual Activity  . Alcohol use: Never  . Drug use: Never  . Sexual activity: Yes    Partners: Male    Comment: 1st intercourse- 69, partners- 34, married- 25 yrs,  Other Topics Concern  . Not on file  Social History Narrative  . Not on file   Social Determinants of Health   Financial Resource Strain: Not on file  Food Insecurity: Not on file  Transportation Needs: Not on file  Physical Activity: Not on file  Stress: Not on file  Social Connections: Not on file    Family History  Problem Relation Age of Onset  . Thyroid disease Mother   . Cancer Mother   . Pancreatic cancer Mother   . Hypertension Father   . Kidney disease Father   . Thyroid cancer Sister        died at 24    Outpatient Encounter Medications as of 12/30/2020  Medication Sig  . acetaminophen (TYLENOL) 500 MG tablet Take 500 mg by mouth at bedtime as needed.   Marland Kitchen atorvastatin (LIPITOR) 40 MG tablet TAKE 1 TABLET BY MOUTH EVERY DAY  . Continuous Blood Gluc Sensor (FREESTYLE LIBRE 14 DAY SENSOR) MISC Inject 1 each into the skin every 14 (fourteen) days. Use as directed.  . Dulaglutide (TRULICITY) 1.5 UQ/3.4FH SOPN Inject 1.5 mg into the skin once a week.  . estradiol (ESTRACE) 0.1 MG/GM vaginal cream Place 1 Applicatorful vaginally 2 (two) times a week.  Marland Kitchen glucose blood test strip Use as instructed to monitor glucose twice daily, before breakfast and before bed.  . [DISCONTINUED] levothyroxine (SYNTHROID) 112 MCG tablet Take 1 tablet (112 mcg total) by mouth daily before breakfast.  . [DISCONTINUED] losartan (COZAAR) 25 MG tablet TAKE 1 TABLET BY MOUTH EVERY DAY  . [DISCONTINUED] metFORMIN (GLUCOPHAGE-XR) 500 MG 24 hr tablet Take 500 mg by mouth every evening. Take one tablet each evening with  dinner  . [DISCONTINUED] TRESIBA FLEXTOUCH 100 UNIT/ML FlexTouch Pen INJECT 25 UNITS INTO THE SKIN AT BEDTIME  . levothyroxine (SYNTHROID) 112 MCG tablet Take 1 tablet (112 mcg total) by mouth daily before breakfast.  . losartan (COZAAR) 25 MG tablet Take 1 tablet (25 mg total) by mouth daily.  . metFORMIN (GLUCOPHAGE-XR) 500 MG 24 hr tablet Take 1 tablet (500 mg total) by mouth every evening. Take one tablet each evening with dinner  . TRESIBA FLEXTOUCH 100 UNIT/ML FlexTouch Pen Inject 25 Units into the skin at bedtime.  . [DISCONTINUED] ALPRAZolam (XANAX) 0.25 MG tablet Take 1 tablet (0.25 mg total) by mouth 2 (two) times daily as needed for anxiety. (Patient not taking: Reported on 12/03/2020)  . [DISCONTINUED] Ascorbic Acid (VITAMIN C PO) Take by mouth.  . [DISCONTINUED] meloxicam (MOBIC) 7.5 MG tablet Take 1 tablet (7.5 mg total) by mouth daily.   No facility-administered encounter medications on file as of 12/30/2020.    ALLERGIES: Allergies  Allergen Reactions  . Latex     Gloves, band aids- causes rash  . Sulfa Antibiotics Other (See Comments)     VACCINATION STATUS: Immunization History  Administered Date(s) Administered  . Fluad Quad(high Dose 65+) 09/10/2020  . Influenza-Unspecified 08/09/2019  . PFIZER(Purple Top)SARS-COV-2 Vaccination 12/31/2019, 01/02/2020, 08/25/2020  . Pneumococcal Conjugate-13 11/24/2020    Diabetes She presents for her follow-up diabetic visit. She has type 2 diabetes mellitus. Onset time: She was diagnosed at approximate age of 63 years. Her disease course has been stable. There are no hypoglycemic associated symptoms. Pertinent negatives for hypoglycemia include no confusion, headaches, nervousness/anxiousness, pallor, seizures or tremors. There are no diabetic associated symptoms. Pertinent negatives for diabetes include no chest pain, no fatigue, no polydipsia, no polyphagia, no polyuria and no weight loss. There are no hypoglycemic complications. Symptoms are stable. Diabetic complications include retinopathy. Risk factors for coronary artery disease include diabetes mellitus, dyslipidemia, hypertension, post-menopausal, sedentary lifestyle and obesity. Current diabetic treatment includes insulin injections and oral agent (dual therapy). She is compliant with treatment most of the time. Her weight is fluctuating minimally. She is following a generally unhealthy diet. When asked about meal planning, she reported none. She has not had a previous visit with a dietitian. She participates in exercise intermittently. Her home blood glucose trend is decreasing steadily. Her breakfast blood glucose range is generally 90-110 mg/dl. Her lunch blood glucose range is generally 90-110 mg/dl. Her dinner blood glucose range is generally 90-110 mg/dl. Her bedtime blood glucose range is generally 130-140 mg/dl. (She presents today with her CGM and logs showing at goal fasting and postprandial glycemic profile.  Her POCT A1c today is 6.8%, improving from last visit of 7.0%.  Analysis of her CGM shows TIR 93%, TAR 5%, TBR 2%.  No  significant hypoglycemia noted or reported.) An ACE inhibitor/angiotensin II receptor blocker is being taken. She does not see a podiatrist.Eye exam is current.  Hyperlipidemia This is a chronic problem. The current episode started more than 1 year ago. The problem is controlled. Recent lipid tests were reviewed and are normal. Exacerbating diseases include diabetes. There are no known factors aggravating her hyperlipidemia. Pertinent negatives include no chest pain, myalgias or shortness of breath. Current antihyperlipidemic treatment includes statins. The current treatment provides moderate improvement of lipids. There are no compliance problems.  Risk factors for coronary artery disease include diabetes mellitus, dyslipidemia, hypertension, a sedentary lifestyle and post-menopausal.  Thyroid Problem Presents for follow-up visit. Onset time: She was diagnosed with hypothyroidism in  her 39s, currently taking Synthroid 112 mcg p.o. daily. Patient reports no anxiety, cold intolerance, constipation, depressed mood, diarrhea, fatigue, heat intolerance, leg swelling, palpitations, tremors, weight gain or weight loss. The symptoms have been stable. Her past medical history is significant for diabetes and hyperlipidemia.  Hypertension This is a chronic problem. The current episode started more than 1 year ago. The problem has been resolved since onset. The problem is controlled. Pertinent negatives include no chest pain, headaches, palpitations or shortness of breath. Agents associated with hypertension include thyroid hormones. Risk factors for coronary artery disease include diabetes mellitus, dyslipidemia, family history, post-menopausal state and sedentary lifestyle. Past treatments include angiotensin blockers. The current treatment provides moderate improvement. There are no compliance problems.  Hypertensive end-organ damage includes retinopathy. Identifiable causes of hypertension include a thyroid problem.     Review of systems  Constitutional: + Minimally fluctuating body weight,  current Body mass index is 30.2 kg/m. , no fatigue, no subjective hyperthermia, no subjective hypothermia Eyes: no blurry vision, no xerophthalmia ENT: no sore throat, no nodules palpated in throat, no dysphagia/odynophagia, no hoarseness Cardiovascular: no chest pain, no shortness of breath, no palpitations, no leg swelling Respiratory: no cough, no shortness of breath Gastrointestinal: no nausea/vomiting/diarrhea Musculoskeletal: no muscle/joint aches Skin: no rashes, no hyperemia Neurological: no tremors, no numbness, no tingling, no dizziness Psychiatric: no depression, no anxiety   Objective:    BP 120/70 (BP Location: Right Arm, Patient Position: Sitting)   Pulse 90   Ht 5' 3.5" (1.613 m)   Wt 173 lb 3.2 oz (78.6 kg)   BMI 30.20 kg/m   Wt Readings from Last 3 Encounters:  12/30/20 173 lb 3.2 oz (78.6 kg)  12/03/20 170 lb 12.8 oz (77.5 kg)  11/24/20 171 lb 9.6 oz (77.8 kg)    BP Readings from Last 3 Encounters:  12/30/20 120/70  12/03/20 118/60  11/24/20 132/60     Physical Exam- Limited  Constitutional:  Body mass index is 30.2 kg/m. , not in acute distress, normal state of mind Eyes:  EOMI, no exophthalmos Neck: Supple Thyroid: No gross goiter Cardiovascular: RRR, no murmers, rubs, or gallops, no edema Respiratory: Adequate breathing efforts, no crackles, rales, rhonchi, or wheezing Musculoskeletal: no gross deformities, strength intact in all four extremities, no gross restriction of joint movements Skin:  no rashes, no hyperemia Neurological: no tremor with outstretched hands     Recent Results (from the past 2160 hour(s))  CBC with Differential/Platelet     Status: Abnormal   Collection Time: 11/24/20 10:54 AM  Result Value Ref Range   WBC 5.2 3.4 - 10.8 x10E3/uL   RBC 4.96 3.77 - 5.28 x10E6/uL   Hemoglobin 13.0 11.1 - 15.9 g/dL   Hematocrit 38.9 34.0 - 46.6 %   MCV 78  (L) 79 - 97 fL   MCH 26.2 (L) 26.6 - 33.0 pg   MCHC 33.4 31.5 - 35.7 g/dL   RDW 13.6 11.7 - 15.4 %   Platelets 197 150 - 450 x10E3/uL   Neutrophils 58 Not Estab. %   Lymphs 34 Not Estab. %   Monocytes 6 Not Estab. %   Eos 2 Not Estab. %   Basos 0 Not Estab. %   Neutrophils Absolute 3.0 1.4 - 7.0 x10E3/uL   Lymphocytes Absolute 1.8 0.7 - 3.1 x10E3/uL   Monocytes Absolute 0.3 0.1 - 0.9 x10E3/uL   EOS (ABSOLUTE) 0.1 0.0 - 0.4 x10E3/uL   Basophils Absolute 0.0 0.0 - 0.2 x10E3/uL   Immature Granulocytes  0 Not Estab. %   Immature Grans (Abs) 0.0 0.0 - 0.1 x10E3/uL  Hepatitis C antibody     Status: None   Collection Time: 11/24/20 10:54 AM  Result Value Ref Range   Hep C Virus Ab <0.1 0.0 - 0.9 s/co ratio    Comment:                                   Negative:     < 0.8                              Indeterminate: 0.8 - 0.9                                   Positive:     > 0.9  The CDC recommends that a positive HCV antibody result  be followed up with a HCV Nucleic Acid Amplification  test (144818).   Comprehensive metabolic panel     Status: Abnormal   Collection Time: 12/22/20  9:21 AM  Result Value Ref Range   Glucose 128 (H) 65 - 99 mg/dL   BUN 15 8 - 27 mg/dL   Creatinine, Ser 0.67 0.57 - 1.00 mg/dL   GFR calc non Af Amer 91 >59 mL/min/1.73   GFR calc Af Amer 105 >59 mL/min/1.73    Comment: **In accordance with recommendations from the NKF-ASN Task force,**   [facility] is in the process of updating its eGFR calculation to the   2021 CKD-EPI creatinine equation that estimates kidney function   without a race variable.    BUN/Creatinine Ratio 22 12 - 28   Sodium 140 134 - 144 mmol/L   Potassium 4.2 3.5 - 5.2 mmol/L   Chloride 104 96 - 106 mmol/L   CO2 21 20 - 29 mmol/L   Calcium 9.4 8.7 - 10.3 mg/dL   Total Protein 7.2 6.0 - 8.5 g/dL   Albumin 4.2 3.8 - 4.8 g/dL   Globulin, Total 3.0 1.5 - 4.5 g/dL   Albumin/Globulin Ratio 1.4 1.2 - 2.2   Bilirubin Total 0.5 0.0 - 1.2  mg/dL   Alkaline Phosphatase 74 44 - 121 IU/L   AST 17 0 - 40 IU/L   ALT 22 0 - 32 IU/L  TSH     Status: Abnormal   Collection Time: 12/22/20  9:21 AM  Result Value Ref Range   TSH 0.397 (L) 0.450 - 4.500 uIU/mL  T4, Free     Status: None   Collection Time: 12/22/20  9:21 AM  Result Value Ref Range   Free T4 1.57 0.82 - 1.77 ng/dL  HgB A1c     Status: Abnormal   Collection Time: 12/30/20 10:16 AM  Result Value Ref Range   Hemoglobin A1C     HbA1c POC (<> result, manual entry)     HbA1c, POC (prediabetic range)     HbA1c, POC (controlled diabetic range) 6.8 0.0 - 7.0 %      Assessment & Plan:   1) Uncontrolled type 2 diabetes mellitus with hyperglycemia ([place])  - Courtney Barry has currently controlled asymptomatic type 2 DM since  70 years of age.  She presents today with her CGM and logs showing at goal fasting and postprandial glycemic profile.  Her POCT A1c today is 6.8%, improving  from last visit of 7.0%.  Analysis of her CGM shows TIR 93%, TAR 5%, TBR 2%.  No significant hypoglycemia noted or reported.  -her diabetes is complicated by retinopathy obesity/sedentary life and she remains at a high risk for more acute and chronic complications which include CAD, CVA, CKD, retinopathy, and neuropathy. These are all discussed in detail with her.  - Nutritional counseling repeated at each appointment due to patients tendency to fall back in to old habits.  - The patient admits there is a room for improvement in their diet and drink choices. -  Suggestion is made for the patient to avoid simple carbohydrates from their diet including Cakes, Sweet Desserts / Pastries, Ice Cream, Soda (diet and regular), Sweet Tea, Candies, Chips, Cookies, Sweet Pastries,  Store Bought Juices, Alcohol in Excess of  1-2 drinks a day, Artificial Sweeteners, Coffee Creamer, and "Sugar-free" Products. This will help patient to have stable blood glucose profile and potentially avoid unintended weight  gain.   - I encouraged the patient to switch to  unprocessed or minimally processed complex starch and increased protein intake (animal or plant source), fruits, and vegetables.   - Patient is advised to stick to a routine mealtimes to eat 3 meals  a day and avoid unnecessary snacks ( to snack only to correct hypoglycemia).  - I have approached her with the following individualized plan to manage  her diabetes and patient agrees:   -She will continue to benefit from simplified treatment regimen, given her tight control of diabetes.    -Given her stable glycemic profile, she will continue current medication regimen.   -She is advised to continue Tresiba 25 units SQ nightly, continue Metformin 500 mg XR daily with supper (due to GI upset) and continue her Trulicity 1.5 mg SQ weekly.  -She is encouraged to continue using her CGM to monitor glucose at least twice daily, before breakfast and before bed, and call the clinic if she has readings less than 70 or greater than 200 for 3 tests in a row.  - She has allergy to sulfa medications, therefore not a candidate for glipizide.  - Specific targets for  A1c;  LDL, HDL, Triglycerides,  were discussed with the patient.  2) Blood Pressure /Hypertension:  Her blood pressure is controlled to target.  She is advised to continue Losartan 25 mg po daily.  3) Lipids/Hyperlipidemia:  Her most recent lipid panel from 04/21/20 shows controlled LDL at 59.  She is advised to continue Lipitor 40 mg po daily at bedtime.  Side effects and precautions discussed with her.  Will recheck lipid panel prior to next visit.  4) Hypothyroidism- longstanding diagnosis since at least from her 44s. Her previsit thyroid function tests are consistent with appropriate hormone replacement.  She is advised to continue Synthroid 112 mcg po daily before breakfast.   - We discussed about the correct intake of her thyroid hormone, on empty stomach at fasting, with water, separated by  at least 30 minutes from breakfast and other medications,  and separated by more than 4 hours from calcium, iron, multivitamins, acid reflux medications (PPIs). -Patient is made aware of the fact that thyroid hormone replacement is needed for life, dose to be adjusted by periodic monitoring of thyroid function tests.  5)  Weight/Diet:  Her Body mass index is 30.2 kg/m.-   she is  a candidate for some weight loss. I discussed with her the fact that loss of 5 - 10% of her  current  body weight will have the most impact on her diabetes management.  Exercise, and detailed carbohydrates information provided  -  detailed on discharge instructions.  6) Chronic Care/Health Maintenance: -she is on ACEI/ARB and Statin medications and is encouraged to initiate and continue to follow up with Ophthalmology, Dentist, Podiatrist at least yearly or according to recommendations, and advised to stay away from smoking. I have recommended yearly flu vaccine and pneumonia vaccine at least every 5 years; moderate intensity exercise for up to 150 minutes weekly; and sleep for at least 7 hours a day.  - she is advised to maintain close follow up with Girtha Rm, NP-C for primary care needs, as well as her other providers for optimal and coordinated care.  - Time spent on this patient care encounter:  40 min, of which > 50% was spent in  counseling and the rest reviewing her blood glucose logs , discussing her hypoglycemia and hyperglycemia episodes, reviewing her current and  previous labs / studies  ( including abstraction from other facilities) and medications  doses and developing a  long term treatment plan and documenting her care.   Please refer to Patient Instructions for Blood Glucose Monitoring and Insulin/Medications Dosing Guide"  in media tab for additional information. Please  also refer to " Patient Self Inventory" in the Media  tab for reviewed elements of pertinent patient history.  Courtney Barry  participated in the discussions, expressed understanding, and voiced agreement with the above plans.  All questions were answered to her satisfaction. she is encouraged to contact clinic should she have any questions or concerns prior to her return visit.  Follow up plan: - Return in about 6 months (around 06/29/2021) for Diabetes follow up with A1c in office, Thyroid follow up, Previsit labs, Bring glucometer and logs.  Courtney Barry, [facility] [address] [place], [place] [address] Phone: [phone] Fax: [phone]  12/30/2020, 10:35 AM

## 2020-12-30 NOTE — Progress Notes (Signed)
Medical Nutrition Therapy:  Appt start time: 1030 end time:  1100  Assessment:  Primary concerns today: Diabetes Type 2 x 15 years.. Lives with her husband..  A1C  6.8% down from 7% She loves her [name] and uses it make to make decisions.  Saw Whitney today at [facility].  Trying to eating meals on time. Taking medications as prescribed. Hasn't been exercising as much as she was but plans to get back to it as weather warms up. Tresiba 25 units a day. Metfomin xr 500 daily., Trulicity 1.5 weekly. Drinking mostly water. Occasionally drinks diet soda.  Lab Results  Component Value Date   HGBA1C 6.8 12/30/2020   Wt Readings from Last 3 Encounters:  12/30/20 173 lb 3.2 oz (78.6 kg)  12/03/20 170 lb 12.8 oz (77.5 kg)  11/24/20 171 lb 9.6 oz (77.8 kg)   Ht Readings from Last 3 Encounters:  12/30/20 5' 3.5" (1.613 m)  12/03/20 5' 3.5" (1.613 m)  11/24/20 5\' 4"  (1.626 m)   There is no height or weight on file to calculate BMI. @BMIFA @ Facility age limit for growth percentiles is 20 years. Facility age limit for growth percentiles is 20 years.    Lab Results  Component Value Date   HGBA1C 6.8 12/30/2020   CMP Latest Ref Rng & Units 12/22/2020 04/21/2020 02/07/2020  Glucose 65 - 99 mg/dL 128(H) 146(H) 205(H)  BUN 8 - 27 mg/dL 15 18 16   Creatinine 0.57 - 1.00 mg/dL 0.67 0.70 0.74  Sodium 134 - 144 mmol/L 140 139 142  Potassium 3.5 - 5.2 mmol/L 4.2 4.6 4.0  Chloride 96 - 106 mmol/L 104 104 105  CO2 20 - 29 mmol/L 21 23 22   Calcium 8.7 - 10.3 mg/dL 9.4 9.6 9.0  Total Protein 6.0 - 8.5 g/dL 7.2 6.9 6.6  Total Bilirubin 0.0 - 1.2 mg/dL 0.5 0.7 0.4  Alkaline Phos 44 - 121 IU/L 74 76 81  AST 0 - 40 IU/L 17 16 15   ALT 0 - 32 IU/L 22 25 20      Preferred Learning Style:  No preference indicated   Learning Readiness:   Ready  Change in progress   MEDICATIONS:    DIETARY INTAKE:   24-hr recall:  B ( AM):  2 ggs, 1  sausage patty and 1/2 english ,water Snk ( AM):  L ( PM): grilled  meats nuggets, side salad, water Snk ( PM):  D ( PM): Grilled chicken breast, Steamed vegetables and part of salad, water Snk ( PM):  Beverages: water  Usual physical activity:   Estimated energy needs: 1200 calories 135 g carbohydrates 90 g protein 33  g fat  Progress Towards Goal(s):  In progress.   Nutritional Diagnosis:  NB-1.1 Food and nutrition-related knowledge deficit As related to DIabetes Type 2.  As evidenced by A1C 6.1%  Basal, bolus insulin. Tresiba 25 units and 5 units of HUmalog FBS 103-120's BL 87-128 mg/dl. BL 85-140 mg/dl.    She has the freestyle libre.  Intervention:  Meal planning, portion control, high fiber diet.  Goals Be sure to eat 2 carb choices per meal. Increase low carb vegetables with lunch and dinner. Keep up the great job. Continue to exercise Lose 2 lbs per monthly  Teaching Method Utilized:  Visual Auditory Hands on  Handouts given during visit include:  The Plate Method   Meal Plan Card   Barriers to learning/adherence to lifestyle change: none  Demonstrated degree of understanding via:  Teach Back   Monitoring/Evaluation:  Dietary intake, exercise, , and body weight in 3 month(s).

## 2020-12-31 DIAGNOSIS — H25813 Combined forms of age-related cataract, bilateral: Secondary | ICD-10-CM | POA: Diagnosis not present

## 2020-12-31 DIAGNOSIS — E119 Type 2 diabetes mellitus without complications: Secondary | ICD-10-CM | POA: Diagnosis not present

## 2020-12-31 DIAGNOSIS — H40033 Anatomical narrow angle, bilateral: Secondary | ICD-10-CM | POA: Diagnosis not present

## 2020-12-31 DIAGNOSIS — H04123 Dry eye syndrome of bilateral lacrimal glands: Secondary | ICD-10-CM | POA: Diagnosis not present

## 2020-12-31 LAB — HM DIABETES EYE EXAM

## 2021-01-01 ENCOUNTER — Other Ambulatory Visit: Payer: Self-pay | Admitting: Internal Medicine

## 2021-01-06 ENCOUNTER — Encounter: Payer: Self-pay | Admitting: Family Medicine

## 2021-01-19 ENCOUNTER — Ambulatory Visit
Admission: RE | Admit: 2021-01-19 | Discharge: 2021-01-19 | Disposition: A | Discharge status: Home / Self Care | Payer: Medicare PPO | Source: Ambulatory Visit | Attending: Obstetrics & Gynecology | Admitting: Obstetrics & Gynecology

## 2021-01-19 ENCOUNTER — Other Ambulatory Visit: Payer: Self-pay

## 2021-01-19 DIAGNOSIS — Z1231 Encounter for screening mammogram for malignant neoplasm of breast: Secondary | ICD-10-CM

## 2021-01-19 DIAGNOSIS — E1165 Type 2 diabetes mellitus with hyperglycemia: Secondary | ICD-10-CM | POA: Diagnosis not present

## 2021-02-02 ENCOUNTER — Ambulatory Visit (HOSPITAL_COMMUNITY)
Admission: RE | Admit: 2021-02-02 | Discharge: 2021-02-02 | Disposition: A | Discharge status: Home / Self Care | Payer: Self-pay | Source: Ambulatory Visit

## 2021-02-10 ENCOUNTER — Other Ambulatory Visit (HOSPITAL_COMMUNITY): Payer: Self-pay | Admitting: Thoracic Surgery (Cardiothoracic Vascular Surgery)

## 2021-02-10 DIAGNOSIS — Z803 Family history of malignant neoplasm of breast: Secondary | ICD-10-CM

## 2021-02-10 DIAGNOSIS — R922 Inconclusive mammogram: Secondary | ICD-10-CM

## 2021-02-18 ENCOUNTER — Other Ambulatory Visit: Payer: Self-pay

## 2021-02-18 ENCOUNTER — Encounter: Payer: Self-pay | Admitting: Family Medicine

## 2021-02-18 ENCOUNTER — Ambulatory Visit: Payer: Medicare PPO | Admitting: Family Medicine

## 2021-02-18 VITALS — BP 130/68 | HR 106 | Wt 172.6 lb

## 2021-02-18 DIAGNOSIS — L299 Pruritus, unspecified: Secondary | ICD-10-CM | POA: Diagnosis not present

## 2021-02-18 DIAGNOSIS — D225 Melanocytic nevi of trunk: Secondary | ICD-10-CM | POA: Diagnosis not present

## 2021-02-18 NOTE — Progress Notes (Signed)
   Subjective:    Patient ID: Courtney Barry, female    DOB: [birth year], 70 y.o.   MRN: [id number]  HPI Chief Complaint  Patient presents with  . Mole on side of breast.    Has started itching all over and during the night thinks she hit it and saw blood running down next day. Been there for 25 years but flared up   Here today with concerns regarding a large mole on her left breast that was recently irritated and bleeding.  States the mole has been present for years but seems to be enlarging and changing.  She has not seen a dermatologist.  She also reports chronic itching all over mainly in the evenings.  She is treating her allergies.  Reports using a good moisturizer yet still having dry scalp and cuticles per her manicurist.  Under the care of endocrinology for diabetes and thyroid.  No fever, chills, dizziness, chest pain, palpitations, shortness of breath, cough, abdominal pain, nausea, vomiting or diarrhea. No rash or arthralgias.   No other concerns today.   Review of Systems Pertinent positives and negatives in the history of present illness.     Objective:   Physical Exam BP 130/68   Pulse (!) 106   Wt 172 lb 9.6 oz (78.3 kg)   SpO2 98%   BMI 30.10 kg/m   1.5 cm round, raised, black nevus on medial left breast crossing over the areola.  no bleeding or irritation today.      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  She will call and schedule this.  Also recommend having a conversation about chronic pruritus with the dermatologist as well.  Continue treating allergies.  Continue using a good moisturizer. Reviewed recent labs and no obvious explanation for pruritus.

## 2021-02-18 NOTE — Patient Instructions (Signed)
Dermatology offices  [facility]: Phone #: [phone] Address: [address], [place], [place] [address]  [facility]: Phone: [phone]  Address: [address], [place], [place] [address]  [facility]: [phone] Address: [address] [place], [place] [address]  [facility] Address: [place], [place], [place] [address] Phone: [phone]

## 2021-02-19 DIAGNOSIS — E1165 Type 2 diabetes mellitus with hyperglycemia: Secondary | ICD-10-CM | POA: Diagnosis not present

## 2021-03-03 ENCOUNTER — Telehealth: Payer: Self-pay | Admitting: Internal Medicine

## 2021-03-03 NOTE — Telephone Encounter (Signed)
Pt has an appt with Dermatology on 5/19 with michelle gaskin per pt

## 2021-03-23 DIAGNOSIS — E1165 Type 2 diabetes mellitus with hyperglycemia: Secondary | ICD-10-CM | POA: Diagnosis not present

## 2021-04-01 DIAGNOSIS — L821 Other seborrheic keratosis: Secondary | ICD-10-CM | POA: Diagnosis not present

## 2021-04-07 ENCOUNTER — Encounter: Payer: Self-pay | Admitting: Internal Medicine

## 2021-04-09 ENCOUNTER — Encounter: Payer: Self-pay | Admitting: Family Medicine

## 2021-04-18 ENCOUNTER — Other Ambulatory Visit: Payer: Self-pay | Admitting: Family Medicine

## 2021-04-23 DIAGNOSIS — E1165 Type 2 diabetes mellitus with hyperglycemia: Secondary | ICD-10-CM | POA: Diagnosis not present

## 2021-05-21 ENCOUNTER — Other Ambulatory Visit: Payer: Self-pay

## 2021-05-21 ENCOUNTER — Ambulatory Visit: Payer: Medicare PPO | Admitting: Medical

## 2021-05-21 ENCOUNTER — Telehealth: Payer: Self-pay | Admitting: Nurse Practitioner

## 2021-05-21 VITALS — BP 120/62 | HR 93 | Wt 172.4 lb

## 2021-05-21 DIAGNOSIS — E782 Mixed hyperlipidemia: Secondary | ICD-10-CM

## 2021-05-21 DIAGNOSIS — E785 Hyperlipidemia, unspecified: Secondary | ICD-10-CM | POA: Diagnosis not present

## 2021-05-21 DIAGNOSIS — Z7185 Encounter for immunization safety counseling: Secondary | ICD-10-CM

## 2021-05-21 DIAGNOSIS — I10 Essential (primary) hypertension: Secondary | ICD-10-CM

## 2021-05-21 DIAGNOSIS — R6889 Other general symptoms and signs: Secondary | ICD-10-CM | POA: Diagnosis not present

## 2021-05-21 DIAGNOSIS — I739 Peripheral vascular disease, unspecified: Secondary | ICD-10-CM | POA: Diagnosis not present

## 2021-05-21 DIAGNOSIS — E1165 Type 2 diabetes mellitus with hyperglycemia: Secondary | ICD-10-CM

## 2021-05-21 DIAGNOSIS — E039 Hypothyroidism, unspecified: Secondary | ICD-10-CM | POA: Diagnosis not present

## 2021-05-21 DIAGNOSIS — E1169 Type 2 diabetes mellitus with other specified complication: Secondary | ICD-10-CM

## 2021-05-21 HISTORY — DX: Encounter for immunization safety counseling: Z71.85

## 2021-05-21 MED ORDER — FREESTYLE LIBRE 2 SENSOR MISC: 1.0000 | 3 refills | Status: DC

## 2021-05-21 MED ORDER — FREESTYLE LIBRE 2 READER DEVI: 0 refills | Status: DC

## 2021-05-21 NOTE — Telephone Encounter (Signed)
Pt is calling and states that her [name] has been messing up. She is requesting that the [place] 2 supplies be sent into [facility]. Her other doctor had given her a sensor 2 to try but it is not compatible since she does not have the reader that matches. She needs the meter, sensor, the whole kit sent in. States she does not have the 2 at home.    [facility] - [place], [place] Phone:  [phone]  Fax:  [phone]

## 2021-05-21 NOTE — Progress Notes (Signed)
Subjective:  Courtney Barry is a 70 y.o. female who presents for Chief Complaint  Patient presents with   med check    Med check, no concerns      Here for chronic disease follow-up and med check.  Medical Team: Rayetta Pigg, NP endocrinologist and dietician Dr. Princess Bruins, gynecology Dr. Eunice Blase, orthopedics Eye doctor, Dr. Katy Fitch dentist  She has a history of hypertension, diabetes, hypothyroidism, hyperlipidemia,  Diabetes-she notes compliance with metformin XR 500 mg daily, Tresiba 25 units daily, Trulicity 1.5 mg weekly shot. She checks her feet daily, no concerns. Morning glucose pretty good, 115-120s.   No polyuria, no polydipsia, no blurred vision Sees endocrinology  Hyperlipidemia-compliant with atorvastatin 40 mg every day without complaint  Hypothyroidism-compliant with levothyroxine 112 mcg daily in the morning fasting  Hypertension-compliant with losartan 25 mg daily.  No chest pain or shortness of breath no edema reported  Saw dermatology about mole she mentioned last visit, not worrisome per dermatology  No other aggravating or relieving factors.    No other c/o.  The following portions of the patient's history were reviewed and updated as appropriate: allergies, current medications, past family history, past medical history, past social history, past surgical history and problem list.  ROS Otherwise as in subjective above     Objective: BP 120/62   Pulse 93   Wt 172 lb 6.4 oz (78.2 kg)   BMI 30.06 kg/m   Wt Readings from Last 3 Encounters:  05/21/21 172 lb 6.4 oz (78.2 kg)  02/18/21 172 lb 9.6 oz (78.3 kg)  12/30/20 173 lb (78.5 kg)   BP Readings from Last 3 Encounters:  05/21/21 120/62  02/18/21 130/68  12/30/20 120/70    General appearance: alert, no distress, well developed, well nourished Neck: supple, no lymphadenopathy, no thyromegaly, no masses Heart: RRR, normal S1, S2, no murmurs Lungs: CTA bilaterally, no  wheezes, rhonchi, or rales Abdomen: +bs, soft, non tender, non distended, no masses, no hepatomegaly, no splenomegaly Pulses: 2+ radial pulses, 2+ pedal pulses, normal cap refill Ext: no edema   Diabetic Foot Exam - Simple   Simple Foot Form Diabetic Foot exam was performed with the following findings: Yes 05/21/2021  2:46 PM  Visual Inspection No deformities, no ulcerations, no other skin breakdown bilaterally: Yes Sensation Testing Intact to touch and monofilament testing bilaterally: Yes Pulse Check See comments: Yes Comments 1+ pedal pulses        Assessment: Encounter Diagnoses  Name Primary?   Uncontrolled type 2 diabetes mellitus with hyperglycemia (HCC) Yes   Mixed hyperlipidemia    Hypothyroidism, unspecified type    Hyperlipidemia associated with type 2 diabetes mellitus ([place])    Essential hypertension, benign    Vaccine counseling    PVD (peripheral vascular disease) (HCC)    Abnormal ankle brachial index ([name])      Plan: Diabetes Sees endocrinology, recent home sugars within goal, has fasting labs planned next month for appt.  We will await those lab results as well.  Due for microalbuimnuria test, lipids, updated thyroid and HgbA1C  Reviewed 12/2020 eye exam, no retinopathy, does have mild cataract  Hypertension-continue current medication  Hyperlipidemia-continue current medication  Hypothyroidism-continue current medication   Vaccine counseling You are up-to-date on pneumococcal vaccine and COVID-vaccine You are eligible for booster for COVID-vaccine Shingles vaccine:  I recommend you have a shingles vaccine to help prevent shingles or herpes zoster outbreak.   Please call your insurer to inquire about coverage for the Shingrix vaccine  given in 2 doses.   Some insurers cover this vaccine after age 77, some cover this after age 3.  If your insurer covers this, then call to schedule appointment to have this vaccine here. I do not see a recent tetanus  vaccine.  I would also recommend you call your insurance about coverage for tetanus booster   Reviewed 2020 [name], mildly abnormal.  Referral for updated [name]   Shallyn was seen today for med check.  Diagnoses and all orders for this visit:  Uncontrolled type 2 diabetes mellitus with hyperglycemia ([place]) -     VAS [place] [name] WITH/WO TBI; Future  Mixed hyperlipidemia  Hypothyroidism, unspecified type  Hyperlipidemia associated with type 2 diabetes mellitus ([place]) -     VAS [place] [name] WITH/WO TBI; Future  Essential hypertension, benign -     VAS [place] [name] WITH/WO TBI; Future  Vaccine counseling  PVD (peripheral vascular disease) ([place]) -     VAS [place] [name] WITH/WO TBI; Future  Abnormal ankle brachial index ([name]) -     VAS [place] [name] WITH/WO TBI; Future  Other orders -     Continuous Blood Gluc Receiver (FREESTYLE LIBRE 2 READER) DEVI; Use as instructed   Follow up: pending [name] call back, yearly for physical

## 2021-05-21 NOTE — Telephone Encounter (Signed)
Rx sent to [facility]. 

## 2021-05-21 NOTE — Patient Instructions (Signed)
Diabetes Sees endocrinology, recent home sugars within goal, has fasting labs planned next month for appt.  We will await those lab results as well.  Due for microalbuimnuria test, lipids, updated thyroid and HgbA1C  Reviewed 12/2020 eye exam, no retinopathy, does have mild cataract  Hypertension-continue current medication  Hyperlipidemia-continue current medication  Hypothyroidism-continue current medication   Vaccine counseling You are up-to-date on pneumococcal vaccine and COVID-vaccine You are eligible for booster for COVID-vaccine Shingles vaccine:  I recommend you have a shingles vaccine to help prevent shingles or herpes zoster outbreak.   Please call your insurer to inquire about coverage for the Shingrix vaccine given in 2 doses.   Some insurers cover this vaccine after age 8, some cover this after age 24.  If your insurer covers this, then call to schedule appointment to have this vaccine here. I do not see a recent tetanus vaccine.  I would also recommend you call your insurance about coverage for tetanus booster   Reviewed 2020 [name], mildly abnormal.  Referral for updated [name]

## 2021-05-25 DIAGNOSIS — E1165 Type 2 diabetes mellitus with hyperglycemia: Secondary | ICD-10-CM | POA: Diagnosis not present

## 2021-05-28 ENCOUNTER — Other Ambulatory Visit: Payer: Self-pay

## 2021-05-28 ENCOUNTER — Ambulatory Visit (INDEPENDENT_AMBULATORY_CARE_PROVIDER_SITE_OTHER): Payer: Medicare PPO

## 2021-05-28 DIAGNOSIS — Z23 Encounter for immunization: Secondary | ICD-10-CM

## 2021-06-17 DIAGNOSIS — E782 Mixed hyperlipidemia: Secondary | ICD-10-CM | POA: Diagnosis not present

## 2021-06-17 DIAGNOSIS — E039 Hypothyroidism, unspecified: Secondary | ICD-10-CM | POA: Diagnosis not present

## 2021-06-17 DIAGNOSIS — E559 Vitamin D deficiency, unspecified: Secondary | ICD-10-CM | POA: Diagnosis not present

## 2021-06-18 LAB — LIPID PANEL
Chol/HDL Ratio: 2.5 ratio (ref 0.0–4.4)
Cholesterol, Total: 94 mg/dL — ABNORMAL LOW (ref 100–199)
HDL: 38 mg/dL — ABNORMAL LOW (ref >39)
LDL Chol Calc (NIH): 43 mg/dL (ref 0–99)
Triglycerides: 56 mg/dL (ref 0–149)
VLDL Cholesterol Cal: 13 mg/dL (ref 5–40)

## 2021-06-18 LAB — TSH: TSH: 0.205 u[IU]/mL — ABNORMAL LOW (ref 0.450–4.500)

## 2021-06-18 LAB — VITAMIN D 25 HYDROXY (VIT D DEFICIENCY, FRACTURES): Vit D, 25-Hydroxy: 77.5 ng/mL (ref 30.0–100.0)

## 2021-06-18 LAB — T4, FREE: Free T4: 1.69 ng/dL (ref 0.82–1.77)

## 2021-06-19 ENCOUNTER — Ambulatory Visit (HOSPITAL_COMMUNITY)
Admission: RE | Admit: 2021-06-19 | Discharge: 2021-06-19 | Disposition: A | Discharge status: Home / Self Care | Payer: Medicare PPO | Source: Ambulatory Visit | Attending: Medical | Admitting: Medical

## 2021-06-19 ENCOUNTER — Other Ambulatory Visit: Payer: Self-pay

## 2021-06-19 DIAGNOSIS — I10 Essential (primary) hypertension: Secondary | ICD-10-CM | POA: Insufficient documentation

## 2021-06-19 DIAGNOSIS — E1169 Type 2 diabetes mellitus with other specified complication: Secondary | ICD-10-CM | POA: Diagnosis not present

## 2021-06-19 DIAGNOSIS — R6889 Other general symptoms and signs: Secondary | ICD-10-CM | POA: Diagnosis not present

## 2021-06-19 DIAGNOSIS — E785 Hyperlipidemia, unspecified: Secondary | ICD-10-CM | POA: Insufficient documentation

## 2021-06-19 DIAGNOSIS — I739 Peripheral vascular disease, unspecified: Secondary | ICD-10-CM | POA: Insufficient documentation

## 2021-06-19 DIAGNOSIS — E1165 Type 2 diabetes mellitus with hyperglycemia: Secondary | ICD-10-CM | POA: Insufficient documentation

## 2021-06-19 NOTE — Progress Notes (Signed)
[name] has been completed.   Preliminary results in CV Proc.   Courtney Barry 06/19/2021 10:24 AM

## 2021-06-25 DIAGNOSIS — E1165 Type 2 diabetes mellitus with hyperglycemia: Secondary | ICD-10-CM | POA: Diagnosis not present

## 2021-06-29 ENCOUNTER — Encounter: Payer: Medicare PPO | Attending: "Endocrinology | Admitting: Nutrition

## 2021-06-29 ENCOUNTER — Telehealth: Payer: Self-pay | Admitting: Nutrition

## 2021-06-29 ENCOUNTER — Ambulatory Visit: Payer: Medicare PPO | Admitting: Nurse Practitioner

## 2021-06-29 ENCOUNTER — Other Ambulatory Visit: Payer: Self-pay

## 2021-06-29 ENCOUNTER — Encounter: Payer: Self-pay | Admitting: Nurse Practitioner

## 2021-06-29 ENCOUNTER — Encounter: Payer: Self-pay | Admitting: Nutrition

## 2021-06-29 VITALS — Ht 64.0 in | Wt 170.0 lb

## 2021-06-29 VITALS — BP 112/69 | HR 92 | Ht 63.5 in | Wt 170.0 lb

## 2021-06-29 DIAGNOSIS — E782 Mixed hyperlipidemia: Secondary | ICD-10-CM | POA: Diagnosis not present

## 2021-06-29 DIAGNOSIS — I10 Essential (primary) hypertension: Secondary | ICD-10-CM | POA: Insufficient documentation

## 2021-06-29 DIAGNOSIS — E669 Obesity, unspecified: Secondary | ICD-10-CM | POA: Insufficient documentation

## 2021-06-29 DIAGNOSIS — E1165 Type 2 diabetes mellitus with hyperglycemia: Secondary | ICD-10-CM

## 2021-06-29 DIAGNOSIS — E039 Hypothyroidism, unspecified: Secondary | ICD-10-CM

## 2021-06-29 DIAGNOSIS — E559 Vitamin D deficiency, unspecified: Secondary | ICD-10-CM | POA: Diagnosis not present

## 2021-06-29 LAB — POCT UA - MICROALBUMIN
Albumin/Creatinine Ratio, Urine, POC: <30
Creatinine, POC: 300 mg/dL
Microalbumin Ur, POC: 10 mg/L

## 2021-06-29 LAB — POCT GLYCOSYLATED HEMOGLOBIN (HGB A1C): Hemoglobin A1C: 7 % — AB (ref 4.0–5.6)

## 2021-06-29 NOTE — Progress Notes (Signed)
06/29/2021, 10:50 AM                            Endocrinology follow-up note  Subjective:    Patient ID: Courtney Barry, female    DOB: 1951-03-20.  Suella Darrion Macaulay is being seen in follow-up for management of currently uncontrolled symptomatic diabetes requested by  Harland Dingwall L, NP-C.   Past Medical History:  Diagnosis Date   Diabetes mellitus, type II ([place])    Hyperlipidemia    Hypothyroidism    Menopausal vaginal dryness     Past Surgical History:  Procedure Laterality Date   ABDOMINAL HYSTERECTOMY     APPENDECTOMY     CHOLECYSTECTOMY     TONSILLECTOMY      Social History   Socioeconomic History   Marital status: Married    Spouse name: Not on file   Number of children: Not on file   Years of education: Not on file   Highest education level: Not on file  Occupational History   Not on file  Tobacco Use   Smoking status: Never   Smokeless tobacco: Never  Vaping Use   Vaping Use: Never used  Substance and Sexual Activity   Alcohol use: Never   Drug use: Never   Sexual activity: Yes    Partners: Male    Comment: 1st intercourse- 40, partners- 37, married- 40 yrs,  Other Topics Concern   Not on file  Social History Narrative   Not on file   Social Determinants of Health   Financial Resource Strain: Not on file  Food Insecurity: Not on file  Transportation Needs: Not on file  Physical Activity: Not on file  Stress: Not on file  Social Connections: Not on file    Family History  Problem Relation Age of Onset   Thyroid disease Mother    Cancer Mother    Pancreatic cancer Mother    Hypertension Father    Kidney disease Father    Thyroid cancer Sister        died at 36    Outpatient Encounter Medications as of 06/29/2021  Medication Sig   Accu-Chek Softclix Lancets lancets Use as instructed to check blood glucose twice daily before breakfast and bed. DX E11.65    acetaminophen (TYLENOL) 500 MG tablet Take 500 mg by mouth at bedtime as needed.   atorvastatin (LIPITOR) 40 MG tablet TAKE 1 TABLET BY MOUTH EVERY DAY   Blood Glucose Monitoring Suppl (ACCU-CHEK GUIDE ME) w/Device KIT Use as directed to check blood glucose twice daily before breakfast and bed. DX E11.65   Continuous Blood Gluc Receiver (FREESTYLE LIBRE 2 READER) DEVI Use as instructed qid. Dx E11.65   Continuous Blood Gluc Sensor (FREESTYLE LIBRE 2 SENSOR) MISC 1 each by Does not apply route every 14 (fourteen) days.   Dulaglutide (TRULICITY) 1.5 JQ/3.0SP SOPN Inject 1.5 mg into the skin once a week.   estradiol (ESTRACE) 0.1 MG/GM vaginal cream Place 1 Applicatorful vaginally 2 (two) times a week.   glucose blood (ACCU-CHEK GUIDE) test strip Use as instructed to test blood glucose twice daily  before breakfast and bed. DX E11.65   levothyroxine (SYNTHROID) 112 MCG tablet Take 1 tablet (112 mcg total) by mouth daily before breakfast.   losartan (COZAAR) 25 MG tablet Take 1 tablet (25 mg total) by mouth daily.   metFORMIN (GLUCOPHAGE-XR) 500 MG 24 hr tablet Take 1 tablet (500 mg total) by mouth every evening. Take one tablet each evening with dinner   TRESIBA FLEXTOUCH 100 UNIT/ML FlexTouch Pen Inject 25 Units into the skin at bedtime.   No facility-administered encounter medications on file as of 06/29/2021.    ALLERGIES: Allergies  Allergen Reactions   Latex     Gloves, band aids- causes rash   Sulfa Antibiotics Other (See Comments)    VACCINATION STATUS: Immunization History  Administered Date(s) Administered   Fluad Quad(high Dose 65+) 09/10/2020   Influenza-Unspecified 08/09/2019   PFIZER Comirnaty(Gray Top)Covid-19 Tri-Sucrose Vaccine 05/28/2021   PFIZER(Purple Top)SARS-COV-2 Vaccination 12/31/2019, 01/02/2020, 08/25/2020   Pneumococcal Conjugate-13 11/24/2020    Diabetes She presents for her follow-up diabetic visit. She has type 2 diabetes mellitus. Onset time: She was  diagnosed at approximate age of 70 years. Her disease course has been stable. There are no hypoglycemic associated symptoms. Pertinent negatives for hypoglycemia include no confusion, headaches, nervousness/anxiousness, pallor, seizures or tremors. There are no diabetic associated symptoms. Pertinent negatives for diabetes include no chest pain, no fatigue, no polydipsia, no polyphagia, no polyuria and no weight loss. There are no hypoglycemic complications. Symptoms are stable. Diabetic complications include retinopathy. Risk factors for coronary artery disease include diabetes mellitus, dyslipidemia, hypertension, post-menopausal, sedentary lifestyle and obesity. Current diabetic treatment includes insulin injections and oral agent (monotherapy). She is compliant with treatment most of the time. Her weight is fluctuating minimally. She is following a generally healthy diet. When asked about meal planning, she reported none. She has not had a previous visit with a dietitian. She participates in exercise intermittently. Her home blood glucose trend is fluctuating minimally. (She presents today with her CGM and logs showing at target fasting and postprandial glycemic profile.  Her POCT A1c today is 7%, increasing slightly from last visit.  She denies any significant episodes of hypoglycemia.  Analysis of her CGM shows TIR 64%, TAR 6%, TBR 0%.) An ACE inhibitor/angiotensin II receptor blocker is being taken. She does not see a podiatrist.Eye exam is current.  Hyperlipidemia This is a chronic problem. The current episode started more than 1 year ago. The problem is controlled. Recent lipid tests were reviewed and are normal. Exacerbating diseases include diabetes. There are no known factors aggravating her hyperlipidemia. Pertinent negatives include no chest pain, myalgias or shortness of breath. Current antihyperlipidemic treatment includes statins. The current treatment provides moderate improvement of lipids.  There are no compliance problems.  Risk factors for coronary artery disease include diabetes mellitus, dyslipidemia, hypertension, a sedentary lifestyle and post-menopausal.  Thyroid Problem Presents for follow-up visit. Onset time: She was diagnosed with hypothyroidism in her 68s, currently taking Synthroid 112 mcg p.o. daily. Patient reports no anxiety, cold intolerance, constipation, depressed mood, diarrhea, fatigue, heat intolerance, leg swelling, palpitations, tremors, weight gain or weight loss. The symptoms have been stable. Her past medical history is significant for diabetes and hyperlipidemia.  Hypertension This is a chronic problem. The current episode started more than 1 year ago. The problem has been resolved since onset. The problem is controlled. Pertinent negatives include no chest pain, headaches, palpitations or shortness of breath. Agents associated with hypertension include thyroid hormones. Risk factors for coronary artery disease include  diabetes mellitus, dyslipidemia, family history, post-menopausal state and sedentary lifestyle. Past treatments include angiotensin blockers. The current treatment provides moderate improvement. There are no compliance problems.  Hypertensive end-organ damage includes retinopathy. Identifiable causes of hypertension include a thyroid problem.   Review of systems  Constitutional: + Minimally fluctuating body weight,  current Body mass index is 29.64 kg/m. , no fatigue, no subjective hyperthermia, no subjective hypothermia Eyes: no blurry vision, no xerophthalmia ENT: no sore throat, no nodules palpated in throat, no dysphagia/odynophagia, no hoarseness Cardiovascular: no chest pain, no shortness of breath, no palpitations, no leg swelling Respiratory: no cough, no shortness of breath Gastrointestinal: no nausea/vomiting/diarrhea Musculoskeletal: no muscle/joint aches Skin: no rashes, no hyperemia Neurological: no tremors, no numbness, no  tingling, no dizziness Psychiatric: no depression, no anxiety   Objective:    BP 112/69   Pulse 92   Ht 5' 3.5" (1.613 m)   Wt 170 lb (77.1 kg)   BMI 29.64 kg/m   Wt Readings from Last 3 Encounters:  06/29/21 170 lb (77.1 kg)  06/29/21 170 lb (77.1 kg)  05/21/21 172 lb 6.4 oz (78.2 kg)    BP Readings from Last 3 Encounters:  06/29/21 112/69  05/21/21 120/62  02/18/21 130/68      Physical Exam- Limited  Constitutional:  Body mass index is 29.64 kg/m. , not in acute distress, normal state of mind Eyes:  EOMI, no exophthalmos Neck: Supple Cardiovascular: RRR, no murmurs, rubs, or gallops, no edema Respiratory: Adequate breathing efforts, no crackles, rales, rhonchi, or wheezing Musculoskeletal: no gross deformities, strength intact in all four extremities, no gross restriction of joint movements Skin:  no rashes, no hyperemia Neurological: no tremor with outstretched hands     Recent Results (from the past 2160 hour(s))  Lipid panel     Status: Abnormal   Collection Time: 06/17/21  8:24 AM  Result Value Ref Range   Cholesterol, Total 94 (L) 100 - 199 mg/dL   Triglycerides 56 0 - 149 mg/dL   HDL 38 (L) >39 mg/dL   VLDL Cholesterol Cal 13 5 - 40 mg/dL   LDL Chol Calc (NIH) 43 0 - 99 mg/dL   Chol/HDL Ratio 2.5 0.0 - 4.4 ratio    Comment:                                   T. Chol/HDL Ratio                                             Men  Women                               1/2 Avg.Risk  3.4    3.3                                   Avg.Risk  5.0    4.4                                2X Avg.Risk  9.6    7.1  3X Avg.Risk 23.4   11.0   TSH     Status: Abnormal   Collection Time: 06/17/21  8:24 AM  Result Value Ref Range   TSH 0.205 (L) 0.450 - 4.500 uIU/mL  T4, free     Status: None   Collection Time: 06/17/21  8:24 AM  Result Value Ref Range   Free T4 1.69 0.82 - 1.77 ng/dL  VITAMIN D 25 Hydroxy (Vit-D Deficiency, Fractures)      Status: None   Collection Time: 06/17/21  8:24 AM  Result Value Ref Range   Vit D, 25-Hydroxy 77.5 30.0 - 100.0 ng/mL    Comment: Vitamin D deficiency has been defined by the [place] and an Endocrine Society practice guideline as a level of serum 25-OH vitamin D less than 20 ng/mL (1,2). The Endocrine Society went on to further define vitamin D insufficiency as a level between 21 and 29 ng/mL (2). 1. IOM (Institute of Medicine). 2010. Dietary reference    intakes for calcium and D. [place]: The    [employer]. 2. Holick MF, Binkley [place], Bischoff-Ferrari HA, et al.    Evaluation, treatment, and prevention of vitamin D    deficiency: an Endocrine Society clinical practice    guideline. JCEM. 2011 Jul; 96(7):1911-30.   HgB A1c     Status: Abnormal   Collection Time: 06/29/21 10:11 AM  Result Value Ref Range   Hemoglobin A1C 7.0 (A) 4.0 - 5.6 %   HbA1c POC (<> result, manual entry)     HbA1c, POC (prediabetic range)     HbA1c, POC (controlled diabetic range)    POCT UA - Microalbumin     Status: Normal   Collection Time: 06/29/21 10:11 AM  Result Value Ref Range   Microalbumin Ur, POC 10 mg/L   Creatinine, POC 300 mg/dL   Albumin/Creatinine Ratio, Urine, POC <30       Assessment & Plan:   1) Uncontrolled type 2 diabetes mellitus with hyperglycemia ([place])  - Letita Klare Criss has currently controlled asymptomatic type 2 DM since  70 years of age.  She presents today with her CGM and logs showing at target fasting and postprandial glycemic profile.  Her POCT A1c today is 7%, increasing slightly from last visit.  She denies any significant episodes of hypoglycemia.  Analysis of her CGM shows TIR 64%, TAR 6%, TBR 0%.  -her diabetes is complicated by retinopathy obesity/sedentary life and she remains at a high risk for more acute and chronic complications which include CAD, CVA, CKD, retinopathy, and neuropathy. These are all discussed in detail with  her.  - Nutritional counseling repeated at each appointment due to patients tendency to fall back in to old habits.  - The patient admits there is a room for improvement in their diet and drink choices. -  Suggestion is made for the patient to avoid simple carbohydrates from their diet including Cakes, Sweet Desserts / Pastries, Ice Cream, Soda (diet and regular), Sweet Tea, Candies, Chips, Cookies, Sweet Pastries, Store Bought Juices, Alcohol in Excess of 1-2 drinks a day, Artificial Sweeteners, Coffee Creamer, and "Sugar-free" Products. This will help patient to have stable blood glucose profile and potentially avoid unintended weight gain.   - I encouraged the patient to switch to unprocessed or minimally processed complex starch and increased protein intake (animal or plant source), fruits, and vegetables.   - Patient is advised to stick to a routine mealtimes to eat 3 meals a day and avoid  unnecessary snacks (to snack only to correct hypoglycemia).  - I have approached her with the following individualized plan to manage  her diabetes and patient agrees:   -She will continue to benefit from simplified treatment regimen, given her tight control of diabetes.    -Given her stable glycemic profile, she will continue current medication regimen including Tresiba 25 units SQ nightly, continue Metformin 500 mg XR daily with supper (due to GI upset) and continue her Trulicity 1.5 mg SQ weekly.  -She is encouraged to continue using her CGM to monitor glucose at least twice daily, before breakfast and before bed, and call the clinic if she has readings less than 70 or greater than 200 for 3 tests in a row.  - She has allergy to sulfa medications, therefore not a candidate for glipizide.  - Specific targets for  A1c;  LDL, HDL, Triglycerides,  were discussed with the patient.  2) Blood Pressure /Hypertension:  Her blood pressure is controlled to target.  She is advised to continue Losartan 25 mg po  daily.  3) Lipids/Hyperlipidemia:  Her most recent lipid panel from 06/17/21 shows controlled LDL at 43.  She is advised to continue Lipitor 40 mg po daily at bedtime.  Side effects and precautions discussed with her.    4) Hypothyroidism- longstanding diagnosis since at least from her 87s. Her previsit thyroid function tests are consistent with appropriate hormone replacement.  She is advised to continue Synthroid 112 mcg po daily before breakfast.    - We discussed about the correct intake of her thyroid hormone, on empty stomach at fasting, with water, separated by at least 30 minutes from breakfast and other medications,  and separated by more than 4 hours from calcium, iron, multivitamins, acid reflux medications (PPIs). -Patient is made aware of the fact that thyroid hormone replacement is needed for life, dose to be adjusted by periodic monitoring of thyroid function tests.  5)  Weight/Diet:  Her Body mass index is 29.64 kg/m.-   she is a candidate for some weight loss. I discussed with her the fact that loss of 5 - 10% of her  current body weight will have the most impact on her diabetes management.  Exercise, and detailed carbohydrates information provided  -  detailed on discharge instructions.  6) Chronic Care/Health Maintenance: -she is on ACEI/ARB and Statin medications and is encouraged to initiate and continue to follow up with Ophthalmology, Dentist, Podiatrist at least yearly or according to recommendations, and advised to stay away from smoking. I have recommended yearly flu vaccine and pneumonia vaccine at least every 5 years; moderate intensity exercise for up to 150 minutes weekly; and sleep for at least 7 hours a day.  - she is advised to maintain close follow up with Girtha Rm, NP-C for primary care needs, as well as her other providers for optimal and coordinated care.    I spent 44 minutes in the care of the patient today including review of labs from [place], Lipids,  Thyroid Function, Hematology (current and previous including abstractions from other facilities); face-to-face time discussing  her blood glucose readings/logs, discussing hypoglycemia and hyperglycemia episodes and symptoms, medications doses, her options of short and long term treatment based on the latest standards of care / guidelines;  discussion about incorporating lifestyle medicine;  and documenting the encounter.    Please refer to Patient Instructions for Blood Glucose Monitoring and Insulin/Medications Dosing Guide"  in media tab for additional information. Please  also refer  to " Patient Self Inventory" in the Media  tab for reviewed elements of pertinent patient history.  Natoria Eryka Dolinger participated in the discussions, expressed understanding, and voiced agreement with the above plans.  All questions were answered to her satisfaction. she is encouraged to contact clinic should she have any questions or concerns prior to her return visit.  Follow up plan: - Return in about 6 months (around 12/30/2021) for Diabetes F/U with A1c in office, Thyroid follow up, Previsit labs, Bring meter and logs.  Rayetta Pigg, [facility] [address] [place], [place] [address] Phone: [phone] Fax: [phone]  06/29/2021, 10:50 AM

## 2021-06-29 NOTE — Patient Instructions (Addendum)
Goals  Increase walking on treadmill to 15 minutes 3-4 times per week. Keep drinking water- 100 oz per day Increase high fiber foods-planted based beans, vegetable and fruits.

## 2021-06-29 NOTE — Progress Notes (Signed)
Medical Nutrition Therapy:  Appt start time: 1030 end time:  1045  Assessment:  Primary concerns today: Diabetes Type 2 x 15 years.. Lives with her husband..  A1C  7% She loves her [name] and uses it make to make decisions.  Saw Courtney Barry today at [employer].  FBS: 110 usually, Bedtime: 140-150's mg/dl. Trying to eating meals on time.  Just started working on exercising. Has been using a treadmill. Is working on increasing her time on the treadmill. Taking medications as prescribed. Tresiba 25 units a day. Metfomin xr 500 daily., Trulicity 1.5 weekly. Diet is doing well. Lab Results  Component Value Date   HGBA1C 7.0 (A) 06/29/2021   Wt Readings from Last 3 Encounters:  06/29/21 170 lb (77.1 kg)  06/29/21 170 lb (77.1 kg)  05/21/21 172 lb 6.4 oz (78.2 kg)   Ht Readings from Last 3 Encounters:  06/29/21 '5\' 4"'$  (1.626 m)  06/29/21 5' 3.5" (1.613 m)  12/30/20 5' 3.5" (1.613 m)   Body mass index is 29.18 kg/m. '@BMIFA'$ @ Facility age limit for growth percentiles is 20 years. Facility age limit for growth percentiles is 20 years.    Lab Results  Component Value Date   HGBA1C 7.0 (A) 06/29/2021   CMP Latest Ref Rng & Units 12/22/2020 04/21/2020 02/07/2020  Glucose 65 - 99 mg/dL 128(H) 146(H) 205(H)  BUN 8 - 27 mg/dL '15 18 16  '$ Creatinine 0.57 - 1.00 mg/dL 0.67 0.70 0.74  Sodium 134 - 144 mmol/L 140 139 142  Potassium 3.5 - 5.2 mmol/L 4.2 4.6 4.0  Chloride 96 - 106 mmol/L 104 104 105  CO2 20 - 29 mmol/L '21 23 22  '$ Calcium 8.7 - 10.3 mg/dL 9.4 9.6 9.0  Total Protein 6.0 - 8.5 g/dL 7.2 6.9 6.6  Total Bilirubin 0.0 - 1.2 mg/dL 0.5 0.7 0.4  Alkaline Phos 44 - 121 IU/L 74 76 81  AST 0 - 40 IU/L '17 16 15  '$ ALT 0 - 32 IU/L '22 25 20     '$ Preferred Learning Style: No preference indicated   Learning Readiness:  Ready Change in progress   MEDICATIONS:    DIETARY INTAKE:   24-hr recall:  B ( AM):  2 eggs, 1/2 c  grits, 1  sausage patty and 1 slice toast ,water Snk ( AM):  L ( PM): 1/2  grilled chicken salad and some fries, water, Snk ( PM): [place] steak, fried okra,  1/2 sweet potato and toss salad, Water Snk ( PM):  Beverages: water  Usual physical activity:   Estimated energy needs: 1200 calories 135 g carbohydrates 90 g protein 33  g fat  Progress Towards Goal(s):  In progress.   Nutritional Diagnosis:  NB-1.1 Food and nutrition-related knowledge deficit As related to DIabetes Type 2.  As evidenced by A1C 6.1%  Basal, bolus insulin. Tresiba 25 units and 5 units of HUmalog FBS 103-120's BL 87-128 mg/dl. BL 85-140 mg/dl.    She has the freestyle libre.  Intervention:  Meal planning, portion control, high fiber diet.  Goals  Increase walking on treadmill to 15 minutes 3-4 times per week. Keep drinking water- 100 oz per day Increase high fiber foods-planted based beans, vegetable and fruits.  Teaching Method Utilized:  Visual Auditory Hands on  Handouts given during visit include: The Plate Method  Meal Plan Card   Barriers to learning/adherence to lifestyle change: none  Demonstrated degree of understanding via:  Teach Back   Monitoring/Evaluation:  Dietary intake, exercise, , and body weight in  3 month(s).

## 2021-06-29 NOTE — Telephone Encounter (Signed)
TC from patient concerned about her BS being 176 2 hours after eating some eggs and sausage and 1.2 of an english muffin for breakfast. She just checked it now and it was 84 mg/dl. Reviewed information about goals for 2 hr pp BS and importance of 30 g CHO at each meal and not restrict carbs too much. She verbalized understanding to be sure to get in 30 g CHO at each meal with protein and veggies.

## 2021-07-03 ENCOUNTER — Other Ambulatory Visit: Payer: Self-pay | Admitting: Nurse Practitioner

## 2021-07-03 ENCOUNTER — Other Ambulatory Visit: Payer: Self-pay | Admitting: Family Medicine

## 2021-07-07 ENCOUNTER — Telehealth: Payer: Self-pay

## 2021-07-07 NOTE — Telephone Encounter (Signed)
error 

## 2021-07-08 ENCOUNTER — Telehealth: Payer: Medicare PPO | Admitting: Medical

## 2021-07-08 ENCOUNTER — Encounter: Payer: Self-pay | Admitting: Medical

## 2021-07-08 ENCOUNTER — Other Ambulatory Visit: Payer: Medicare PPO

## 2021-07-08 VITALS — Temp 97.9°F | Wt 169.0 lb

## 2021-07-08 DIAGNOSIS — R0989 Other specified symptoms and signs involving the circulatory and respiratory systems: Secondary | ICD-10-CM | POA: Diagnosis not present

## 2021-07-08 DIAGNOSIS — R059 Cough, unspecified: Secondary | ICD-10-CM

## 2021-07-08 DIAGNOSIS — R0981 Nasal congestion: Secondary | ICD-10-CM

## 2021-07-08 MED ORDER — GUAIFENESIN-CODEINE 100-10 MG/5ML PO SYRP: 5.0000 mL | ORAL_SOLUTION | Freq: Three times a day (TID) | ORAL | 0 refills | Status: DC | PRN

## 2021-07-08 NOTE — Progress Notes (Signed)
Subjective:     Patient ID: Courtney Barry, female   DOB: 1951-01-17, 70 y.o.   MRM: [id number]  This visit type was conducted due to national recommendations for restrictions regarding the COVID-19 Pandemic (e.g. social distancing) in an effort to limit this patient's exposure and mitigate transmission in our community.  Due to their co-morbid illnesses, this patient is at least at moderate risk for complications without adequate follow up.  This format is felt to be most appropriate for this patient at this time.    Documentation for virtual audio and video telecommunications through [place] encounter:  The patient was located at home. The provider was located in the office. The patient did consent to this visit and is aware of possible charges through their insurance for this visit.  The other persons participating in this telemedicine service were none. Time spent on call was 20 minutes and in review of previous records 20 minutes total.  This virtual service is not related to other E/BM service within previous 7 days.   HPI Chief Complaint  Patient presents with   Sore Throat    Sore throat, cough, stuffy head, ear popping. All started Monday morning. Haven't taken any covid test.    Virtual consult for cough, congestion, "allergies:"    She notes sore throat, ear pressure, stuffy on right side of head and sinuses 4 days ago.  Sneezing, congestion and sore throat has worsened.  Yesterday started taking some medication to help with symptoms.   Using xyzal OTC but this made her sleepy, makes her even sleepy the next day.  Has itchy and scratchy throat, sneezing.  No body aches, no chills, no fever.   Feels some clammy at night but no fever.   Some couhg due to post nasal drainage.   No sick contacts.  No other aggravating or relieving factors. No other complaint.   Past Medical History:  Diagnosis Date   Diabetes mellitus, type II ([place])    Hyperlipidemia    Hypothyroidism     Menopausal vaginal dryness    Current Outpatient Medications on File Prior to Visit  Medication Sig Dispense Refill   acetaminophen (TYLENOL) 500 MG tablet Take 500 mg by mouth at bedtime as needed.     atorvastatin (LIPITOR) 40 MG tablet TAKE 1 TABLET BY MOUTH EVERY DAY 90 tablet 0   Blood Glucose Monitoring Suppl (ACCU-CHEK GUIDE ME) w/Device KIT Use as directed to check blood glucose twice daily before breakfast and bed. DX E11.65 1 kit 0   Continuous Blood Gluc Receiver (FREESTYLE LIBRE 2 READER) DEVI Use as instructed qid. Dx E11.65 1 each 0   Continuous Blood Gluc Sensor (FREESTYLE LIBRE 2 SENSOR) MISC 1 each by Does not apply route every 14 (fourteen) days. 6 each 3   estradiol (ESTRACE) 0.1 MG/GM vaginal cream Place 1 Applicatorful vaginally 2 (two) times a week. 42.5 g 4   glucose blood (ACCU-CHEK GUIDE) test strip Use as instructed to test blood glucose twice daily before breakfast and bed. DX E11.65 100 each 12   levothyroxine (SYNTHROID) 112 MCG tablet Take 1 tablet (112 mcg total) by mouth daily before breakfast. 90 tablet 1   losartan (COZAAR) 25 MG tablet Take 1 tablet (25 mg total) by mouth daily. 90 tablet 3   metFORMIN (GLUCOPHAGE-XR) 500 MG 24 hr tablet Take 1 tablet (500 mg total) by mouth every evening. Take one tablet each evening with dinner 90 tablet 3   TRESIBA FLEXTOUCH 100 UNIT/ML FlexTouch Pen  Inject 25 Units into the skin at bedtime. 30 mL 2   TRULICITY 1.5 WN/4.6EV SOPN INJECT 1.5 MG INTO THE SKIN ONCE A WEEK. 6 mL 0   Accu-Chek Softclix Lancets lancets Use as instructed to check blood glucose twice daily before breakfast and bed. DX E11.65 100 each 12   No current facility-administered medications on file prior to visit.     Review of Systems As in subjective    Objective:   Physical Exam Due to coronavirus pandemic stay at home measures, patient visit was virtual and they were not examined in person.   Temp 97.9 F (36.6 C)   Wt 169 lb (76.7 kg)   BMI  29.01 kg/m   Gen: wd, wn, nad, well appearing Somewhat congested sounding No obvious wheezing or labored breathing    Assessment:     Encounter Diagnoses  Name Primary?   Runny nose Yes   Cough    Head congestion        Plan:     Discussed symptoms and concerns.   Seems to be more allergic rhinitis related.   Offered covid test just to rule out covid given current symptoms.  In the meantime, begin Claritin since this is non sedating, can use Flonase nasal, nasal saline.  Hydrate well.     Begin cough syrup below as requested.  If worse or not seeing improving, then recheck or let me know.  Courtney Barry was seen today for sore throat.  Diagnoses and all orders for this visit:  Runny nose  Cough  Head congestion  Other orders -     guaiFENesin-codeine (ROBITUSSIN AC) 100-10 MG/5ML syrup; Take 5 mLs by mouth 3 (three) times daily as needed for cough.  F/u prn

## 2021-07-09 ENCOUNTER — Other Ambulatory Visit (INDEPENDENT_AMBULATORY_CARE_PROVIDER_SITE_OTHER): Payer: Medicare PPO

## 2021-07-09 ENCOUNTER — Telehealth: Payer: Self-pay

## 2021-07-09 DIAGNOSIS — R059 Cough, unspecified: Secondary | ICD-10-CM

## 2021-07-09 DIAGNOSIS — R0981 Nasal congestion: Secondary | ICD-10-CM | POA: Diagnosis not present

## 2021-07-09 DIAGNOSIS — R0989 Other specified symptoms and signs involving the circulatory and respiratory systems: Secondary | ICD-10-CM

## 2021-07-09 LAB — POC COVID19 BINAXNOW: SARS Coronavirus 2 Ag: NEGATIVE

## 2021-07-09 NOTE — Telephone Encounter (Signed)
Advised of negative covid results. [place]

## 2021-07-09 NOTE — Addendum Note (Signed)
Addended by: Minette Headland A on: 07/09/2021 11:34 AM   Modules accepted: Orders

## 2021-07-09 NOTE — Progress Notes (Signed)
Pt was advised Courtney Barry 

## 2021-07-10 LAB — SARS-COV-2, NAA 2 DAY TAT

## 2021-07-10 LAB — NOVEL CORONAVIRUS, NAA: SARS-CoV-2, NAA: NOT DETECTED

## 2021-07-13 ENCOUNTER — Other Ambulatory Visit: Payer: Self-pay | Admitting: "Endocrinology

## 2021-07-24 ENCOUNTER — Ambulatory Visit (HOSPITAL_COMMUNITY): Payer: Self-pay

## 2021-07-24 ENCOUNTER — Ambulatory Visit
Admission: RE | Admit: 2021-07-24 | Discharge: 2021-07-24 | Disposition: A | Discharge status: Home / Self Care | Payer: Managed Care, Other (non HMO) | Source: Ambulatory Visit | Attending: Thoracic Surgery (Cardiothoracic Vascular Surgery) | Admitting: Thoracic Surgery (Cardiothoracic Vascular Surgery)

## 2021-07-24 ENCOUNTER — Other Ambulatory Visit: Payer: Self-pay

## 2021-07-24 ENCOUNTER — Encounter (HOSPITAL_COMMUNITY): Payer: Self-pay

## 2021-07-24 DIAGNOSIS — R922 Inconclusive mammogram: Secondary | ICD-10-CM | POA: Insufficient documentation

## 2021-07-24 DIAGNOSIS — Z803 Family history of malignant neoplasm of breast: Secondary | ICD-10-CM | POA: Insufficient documentation

## 2021-07-24 MED ORDER — GADOTERATE MEGLUMINE 0.5 MMOL/ML (376.9 MG/ML) INTRAVENOUS SOLUTION
20.0000 mL | INTRAVENOUS | Status: AC
Start: 2021-07-24 — End: 2021-07-24
  Administered 2021-07-24: 13 mL via INTRAVENOUS

## 2021-07-27 ENCOUNTER — Ambulatory Visit (INDEPENDENT_AMBULATORY_CARE_PROVIDER_SITE_OTHER): Payer: Self-pay | Admitting: Family

## 2021-07-27 DIAGNOSIS — E1165 Type 2 diabetes mellitus with hyperglycemia: Secondary | ICD-10-CM | POA: Diagnosis not present

## 2021-07-27 NOTE — Telephone Encounter (Signed)
Left message regarding benign MRI results and gave reminder of appt in March 2023.

## 2021-08-04 ENCOUNTER — Other Ambulatory Visit: Payer: Self-pay | Admitting: Family Medicine

## 2021-08-04 ENCOUNTER — Other Ambulatory Visit: Payer: Self-pay | Admitting: "Endocrinology

## 2021-08-04 ENCOUNTER — Other Ambulatory Visit: Payer: Self-pay | Admitting: Medical

## 2021-08-04 DIAGNOSIS — M542 Cervicalgia: Secondary | ICD-10-CM

## 2021-08-04 DIAGNOSIS — M26621 Arthralgia of right temporomandibular joint: Secondary | ICD-10-CM

## 2021-08-14 ENCOUNTER — Other Ambulatory Visit: Payer: Self-pay

## 2021-08-14 ENCOUNTER — Ambulatory Visit: Payer: Medicare PPO | Admitting: Family Medicine

## 2021-08-14 ENCOUNTER — Encounter: Payer: Self-pay | Admitting: Family Medicine

## 2021-08-14 VITALS — BP 134/68 | Wt 171.4 lb

## 2021-08-14 DIAGNOSIS — R221 Localized swelling, mass and lump, neck: Secondary | ICD-10-CM | POA: Diagnosis not present

## 2021-08-14 DIAGNOSIS — Z23 Encounter for immunization: Secondary | ICD-10-CM

## 2021-08-14 NOTE — Addendum Note (Signed)
Addended by: Sheilah Pigeon A on: 08/14/2021 12:57 PM   Modules accepted: Orders

## 2021-08-14 NOTE — Progress Notes (Signed)
   Subjective:    Patient ID: Mateo Flow, female    DOB: 09-Dec-1950, 70 y.o.   MRN: [id number]  HPI Chief Complaint  Patient presents with   Cyst    Under chin first noticed in July and the past month feels wider   Is here with complaints of a nontender mass under the right side/midline area of her chin since June or July.  States the area is enlarging.  She denies any pain. No other symptoms or areas of concern.  No fever, chills, night sweats, unexplained weight loss, URI symptoms.   Review of Systems Pertinent positives and negatives in the history of present illness.     Objective:   Physical Exam BP 134/68   Wt 171 lb 6.4 oz (77.7 kg)   BMI 29.42 kg/m   She has a 1 cm round, smooth, nontender palpable mass close to midline in the submental region.  Head and neck exam normal otherwise.      Assessment & Plan:  Palpable mass of neck - Plan: US Soft Tissue Head/Neck (NON-THYROID) Discussed that the mass is smooth, round and slightly movable and most likely benign.  We will get an ultrasound of the area to further assess.

## 2021-08-18 ENCOUNTER — Ambulatory Visit
Admission: RE | Admit: 2021-08-18 | Discharge: 2021-08-18 | Disposition: A | Discharge status: Home / Self Care | Payer: Medicare PPO | Source: Ambulatory Visit | Attending: Family Medicine | Admitting: Family Medicine

## 2021-08-18 DIAGNOSIS — R221 Localized swelling, mass and lump, neck: Secondary | ICD-10-CM

## 2021-08-21 ENCOUNTER — Telehealth: Payer: Self-pay

## 2021-08-21 NOTE — Telephone Encounter (Signed)
Pt. Called wanting to know her [place] results and I gave them to her. She also wanted to know if there was anything she could do to make the lymph node to go down by rubbing it or rubbing anything on it. She has had it for several months now and seems to be bigger or not improving.

## 2021-08-25 ENCOUNTER — Other Ambulatory Visit: Payer: Self-pay

## 2021-08-25 ENCOUNTER — Encounter: Payer: Self-pay | Admitting: Family Medicine

## 2021-08-25 ENCOUNTER — Ambulatory Visit
Admission: RE | Admit: 2021-08-25 | Discharge: 2021-08-25 | Disposition: A | Discharge status: Home / Self Care | Payer: Medicare PPO | Source: Ambulatory Visit | Attending: Family Medicine | Admitting: Family Medicine

## 2021-08-25 ENCOUNTER — Ambulatory Visit: Payer: Medicare PPO | Admitting: Family Medicine

## 2021-08-25 VITALS — BP 130/72 | HR 82 | Temp 97.2°F | Wt 172.6 lb

## 2021-08-25 DIAGNOSIS — M25562 Pain in left knee: Secondary | ICD-10-CM

## 2021-08-25 DIAGNOSIS — M25462 Effusion, left knee: Secondary | ICD-10-CM | POA: Diagnosis not present

## 2021-08-25 NOTE — Patient Instructions (Signed)
Ice for 20 minutes 3 times per day.  Do this for the next 2 or 3 days and then you can switch to heat for 20 minutes 3 times per day.  You can continue on Tylenol 2 tablets 3 times per day.  If you need you can add either Advil or Aleve to that..  You can put an Ace wrap on if you want a sleeve

## 2021-08-25 NOTE — Progress Notes (Signed)
   Subjective:    Patient ID: Courtney Barry, female    DOB: [birth year], 70 y.o.   MRN: [id number]  HPI She states that yesterday when she stepped on to a curb she noted pain on the medial aspect of her knee but no popping, locking or grinding.  She was able to continue to do this.  She also states that her left leg is always been weaker than the right.  Review of Systems     Objective:   Physical Exam Exam of the left knee does show a moderate effusion.  Tender to palpation along the medial joint line especially posteriorly.  McMurray's testing causes pain.  Anterior drawer negative.  Patella normal.  Medial and lateral collateral ligaments intact.       Assessment & Plan:  Acute pain of left knee - Plan: DG Knee Complete 4 Views Left  Effusion of left knee joint Ice for 20 minutes 3 times per day.  Do this for the next 2 or 3 days and then you can switch to heat for 20 minutes 3 times per day.  You can continue on Tylenol 2 tablets 3 times per day.  If you need you can add either Advil or Aleve to that..  You can put an Ace wrap or if you want a sleeve Return here in 2 weeks.  Discussed follow-up encouraging PT, injection, MRI and referral.

## 2021-08-27 DIAGNOSIS — E1165 Type 2 diabetes mellitus with hyperglycemia: Secondary | ICD-10-CM | POA: Diagnosis not present

## 2021-09-08 ENCOUNTER — Ambulatory Visit: Payer: Medicare PPO | Admitting: Family Medicine

## 2021-09-14 ENCOUNTER — Ambulatory Visit: Payer: Medicare PPO | Admitting: Family Medicine

## 2021-09-14 ENCOUNTER — Encounter: Payer: Self-pay | Admitting: Family Medicine

## 2021-09-14 ENCOUNTER — Other Ambulatory Visit: Payer: Self-pay

## 2021-09-14 VITALS — BP 126/74 | HR 83 | Temp 97.3°F | Wt 173.8 lb

## 2021-09-14 DIAGNOSIS — M25562 Pain in left knee: Secondary | ICD-10-CM

## 2021-09-14 DIAGNOSIS — M25462 Effusion, left knee: Secondary | ICD-10-CM

## 2021-09-14 NOTE — Progress Notes (Signed)
   Subjective:    Patient ID: Courtney Barry, female    DOB: 09-16-1951, 70 y.o.   MRN: [id number]  HPI She is here for a recheck.  She states that she is roughly 80% better.  She does have some stiffness in the morning and slight discomfort when she puts a little bit more pressure on that area.  She is using Tylenol and occasionally using Aleve.   Review of Systems     Objective:   Physical Exam Left knee exam does show a slight effusion.  No tenderness along the joint line.  Negative anterior drawer.  McMurray's testing causes some slight discomfort.       Assessment & Plan:  Effusion of left knee joint - Plan: Ambulatory referral to Physical Therapy  Acute pain of left knee - Plan: Ambulatory referral to Physical Therapy I explained that the next thing would be with physical therapy and give that at least a month to see what benefit she would get.  If no improvement I would consider giving her a shot and if continued difficulty orthopedic referral.  She was comfortable with that.

## 2021-09-30 ENCOUNTER — Other Ambulatory Visit: Payer: Self-pay

## 2021-09-30 ENCOUNTER — Encounter: Payer: Self-pay | Admitting: Physical Therapy

## 2021-09-30 ENCOUNTER — Ambulatory Visit: Payer: Medicare PPO | Attending: Family Medicine | Admitting: Physical Therapy

## 2021-09-30 DIAGNOSIS — M25462 Effusion, left knee: Secondary | ICD-10-CM | POA: Diagnosis not present

## 2021-09-30 DIAGNOSIS — R2689 Other abnormalities of gait and mobility: Secondary | ICD-10-CM | POA: Insufficient documentation

## 2021-09-30 DIAGNOSIS — M6281 Muscle weakness (generalized): Secondary | ICD-10-CM | POA: Diagnosis present

## 2021-09-30 DIAGNOSIS — M25562 Pain in left knee: Secondary | ICD-10-CM | POA: Diagnosis present

## 2021-09-30 NOTE — Patient Instructions (Signed)
Access Code: [email or web address] URL: [email or web address] Date: 09/30/2021 Prepared by: Shearon Balo  Exercises Supine Quad Set - 3 x daily - 7 x weekly - 2 sets - 10 reps - 10 hold Small Range Straight Leg Raise - 1 x daily - 7 x weekly - 3 sets - 10 reps

## 2021-09-30 NOTE — Therapy (Signed)
[place], [place], [address] Phone: [phone]   Fax:  [phone]  Physical Therapy Evaluation  Patient Details  Name: Courtney Barry MRN: [id number] Date of Birth: 1951-07-23 Referring Provider (PT): Denita Lung, MD  Encounter Date: 09/30/2021   PT End of Session - 09/30/21 1141     Visit Number 1    Number of Visits 16    Date for PT Re-Evaluation 11/28/20    Authorization Type [employer] MCR    Progress Note Due on Visit 10    PT Start Time 1000    PT Stop Time 1042    PT Time Calculation (min) 42 min             Past Medical History:  Diagnosis Date   Diabetes mellitus, type II ([place])    Hyperlipidemia    Hypothyroidism    Menopausal vaginal dryness     Past Surgical History:  Procedure Laterality Date   ABDOMINAL HYSTERECTOMY     APPENDECTOMY     CHOLECYSTECTOMY     TONSILLECTOMY      There were no vitals filed for this visit.  Subjective:   Courtney Barry is a 70 y.o. female who presents to clinic with chief complaint of L knee pain.    MOI/History of condition: 10/10, stepped off curb and had sudden L knee pain and pop.  She reports it was painful and weak.  She had immediate swelling.  X-ray (-), no MRI.  Denies, popping, clicking, locking.  She reports that if feels weak and shaky like it may give way  Red flags: denies   Pain location: anterio-medial joint line pain 48 hour pain intensity:  highest 6/10 current 0/10,  best 0/10 Aggs: walking (10 min), standing (10 min), walking up steps (unable reciprocal) Eases: rest, elevation, ice Nature: aching and dull Severity: moderate Irritability: moderate Stage: Subacute Stability: better 24 hour pattern: worse with more activity  Vocation/requirements: retired Hobbies: play golf, yardwork, walking for recreation Functional limitations/goals (if different from vocation and hobbies): see above Home  environment: lives with husband at home, 4 STE with rail,  Assistive device: currently using SPC, none at baseline  Significant PMH: LATEX allergy (rash), DM TII, hypothyroidism       [facility] PT Assessment - 09/30/21 0001       Assessment   Medical Diagnosis Referred by: Denita Lung, MD (09/14/2021)    Referring Provider (PT) Denita Lung, MD    Onset Date/Surgical Date 08/24/21      Precautions   Precaution Comments Significant PMH: LATEX allergy (rash), DM TII, hypothyroidism      Restrictions   Other Position/Activity Restrictions none      Balance Screen   Has the patient fallen in the past 6 months No      Prior Function   Level of Independence Independent      Observation/Other Assessments   Observations moderate knee valgus bil    Focus on Therapeutic Outcomes (FOTO)  next visit      Functional Tests   Functional tests Sit to Stand;Other;Other2      Other:   Other/ Comments gait speed (10 m) .53 m/s with cane      ROM / Strength   AROM / PROM / Strength PROM;Strength      PROM   Overall PROM Comments R knee WNL, L knee 5-90 with pain at end range      Strength  Overall Strength Comments R knee 4/5, L knee 3/5 limited by pain    Strength Assessment Site Knee    Right/Left Knee Right;Left      Palpation   Palpation comment Tenderness over MCL      Special Tests   Other special tests Medial McMurrays (+),  Possible laxity MCL with ligamentous testing (equivocal)                        Objective measurements completed on examination: See above findings.                PT Education - 09/30/21 1144     Education Details POC, diagnosis, prognosis, HEP.  Pt educated via explanation, demonstration, and handout (HEP).  Pt confirms understanding verbally.              PT Short Term Goals - 09/30/21 1144       PT SHORT TERM GOAL #1   Title Javaria will be >75% HEP compliant to improve carryover between sessions and  facilitate independent management of condition    Target Date 10/21/21               PT Long Term Goals - 09/30/21 1145       PT LONG TERM GOAL #1   Title Target date for all long term goals:  11/28/20      PT LONG TERM GOAL #2   Title FOTO GOAL      PT LONG TERM GOAL #3   Title Quenisha will improve 10 meter max gait speed to .7 m/s (.1 m/s MCID) to show functional improvement in ambulation  EVAL: .53 m/s      PT LONG TERM GOAL #4   Title Cammi will improve the following MMTs to >/= 4/5 to show improvement in strength:  L knee  EVAL: 3/5 limited by pain      PT LONG TERM GOAL #5   Title Michaeline will be able to navigate 10 steps using reciprocal pattern, not limited by pain, to enable community ambulation  EVAL: limited by pain      Additional Long Term Goals   Additional Long Term Goals Yes      PT LONG TERM GOAL #6   Title Kaylia will report >/= 50% decrease in pain from evaluation  EVAL: 6/10 max pain      PT LONG TERM GOAL #7   Title Vinia will be able to return to golfing, not limited by pain  EVAL: limited                    Plan - 09/30/21 1142     Clinical Impression Statement Rachel is a 70 y.o. female who presents to clinic with signs and sxs consistent with R knee pain.  Possible medial meniscus and/or MCL involvement.  Leaning toward meniscus given pain location and the amount of swelling reported after the injury.  Pt presents with pain and impairments/deficits in: L knee ROM, L knee strength, gait speed.  Activity limitations include: stairs, ambulation, standing.  Participation limitations include: cooking, ADLs, yardwork, golf.  Pt will benefit from skilled therapy to address pain and the listed deficits in order to achieve functional goals, enable safety and independence in completion of daily tasks, and return to PLOF.    Stability/Clinical Decision Making Stable/Uncomplicated    Clinical Decision Making Low    Rehab Potential Fair    PT  Frequency 2x / week  PT Duration 8 weeks    PT Treatment/Interventions ADLs/Self Care Home Management;Aquatic Therapy;Electrical Stimulation;Iontophoresis 4mg /ml Dexamethasone;Gait training;Therapeutic activities;Therapeutic exercise;Neuromuscular re-education;Manual techniques;Dry needling;Vasopneumatic Device    PT Next Visit Plan TAKE FOTO; progressive knee strengthening as tolerated    PT Home Exercise Plan [email or web address]             Patient will benefit from skilled therapeutic intervention in order to improve the following deficits and impairments:  Abnormal gait, Difficulty walking, Decreased mobility, Decreased strength, Pain  Visit Diagnosis: Left knee pain, unspecified chronicity  Muscle weakness  Other abnormalities of gait and mobility     Problem List Patient Active Problem List   Diagnosis Date Noted   Vaccine counseling 05/21/2021   PVD (peripheral vascular disease) ([place]) 05/21/2021   Abnormal ankle brachial index ([name]) 05/21/2021   Situational anxiety 11/21/2019   Hyperlipidemia associated with type 2 diabetes mellitus ([place]) 11/21/2019   Diabetes mellitus without complication ([place]) 45/62/5638   Uncontrolled type 2 diabetes mellitus with hyperglycemia ([place]) 09/24/2019   Essential hypertension, benign 09/24/2019   Hypothyroidism 09/24/2019   Mixed hyperlipidemia 09/24/2019   Menopausal vaginal dryness    Cyst of vulva 09/11/2015   Epidermoid cyst of skin 09/11/2015   Melanocytic nevus 09/11/2015   Dysplastic nevus of skin 09/11/2015   Normal gynecologic examination 09/05/2014   Encounter for specialized medical examination 09/04/2013    Mathis Dad, PT 09/30/2021, 11:47 AM  [facility] [address] [place], [place], [address] Phone: [phone]   Fax:  [phone]  Name: Alan Riles MRN: [id number] Date of Birth: 1951-11-12  Referring diagnosis? Acute pain of left knee  [M25.562] Treatment diagnosis? (if different than referring diagnosis) Acute pain of left knee [M25.562] What was this (referring dx) caused by? []  Surgery []  Fall []  Ongoing issue []  Arthritis [x]  Other: sudden pain in L knee after stepping off curb____________  Laterality: []  Rt [x]  Lt []  Both  Check all possible CPT codes:      [x]  97110 (Therapeutic Exercise)  []  92507 (SLP Treatment)  [x]  97112 (Neuro Re-ed)   []  92526 (Swallowing Treatment)   [x]  97116 (Gait Training)   []  [id number] (Cognitive Training, 1st 15 minutes) [x]  97140 (Manual Therapy)   []  97130 (Cognitive Training, each add'l 15 minutes)  [x]  97530 (Therapeutic Activities)  []  Other, List CPT Code ____________    [x]  80321 (Self Care)       [x]  All codes above (97110 - 97535)  []  97012 (Mechanical Traction)  []  97014 (E-stim Unattended)  []  97032 (E-stim manual)  []  97033 (Ionto)  []  97035 (Ultrasound)  []  97760 (Orthotic Fit) []  [id number] (Physical Performance Training) []  [id number] (Aquatic Therapy) []  97034 (Contrast Bath) []  [id number] (Paraffin) []  97597 (Wound Care 1st 20 sq cm) []  97598 (Wound Care each add'l 20 sq cm) []  97016 (Vasopneumatic Device) []  [id number] Comptroller) []  [id number] (Prosthetic Training)

## 2021-10-02 ENCOUNTER — Ambulatory Visit: Payer: Medicare PPO | Admitting: Physical Therapy

## 2021-10-02 ENCOUNTER — Encounter: Payer: Self-pay | Admitting: Physical Therapy

## 2021-10-02 ENCOUNTER — Other Ambulatory Visit: Payer: Self-pay

## 2021-10-02 DIAGNOSIS — M25562 Pain in left knee: Secondary | ICD-10-CM | POA: Diagnosis not present

## 2021-10-02 DIAGNOSIS — M6281 Muscle weakness (generalized): Secondary | ICD-10-CM

## 2021-10-02 DIAGNOSIS — R2689 Other abnormalities of gait and mobility: Secondary | ICD-10-CM

## 2021-10-02 NOTE — Patient Instructions (Signed)
Access Code: [email or web address] URL: [email or web address] Date: 10/02/2021 Prepared by: Hessie Diener  Exercises Supine Quad Set - 3 x daily - 7 x weekly - 2 sets - 10 reps - 10 hold Small Range Straight Leg Raise - 1 x daily - 7 x weekly - 3 sets - 10 reps Supine Heel Slide - 1 x daily - 7 x weekly - 3 sets - 10 reps

## 2021-10-02 NOTE — Therapy (Signed)
[facility], [place], [address] Phone: [phone]   Fax:  [phone]  Physical Therapy Treatment  Patient Details  Name: Courtney Barry MRN: [id number] Date of Birth: 15-Feb-1951 Referring Provider (PT): Denita Lung, MD   Encounter Date: 10/02/2021   PT End of Session - 10/02/21 1033     Visit Number 2    Number of Visits 16    Date for PT Re-Evaluation 11/28/20    Authorization Type [employer] MCR    Authorization Time Period 10/02/21-12/05/20-16 visits    Authorization - Visit Number 1    Authorization - Number of Visits 16    Progress Note Due on Visit 10    PT Start Time 1019    PT Stop Time 1100    PT Time Calculation (min) 41 min             Past Medical History:  Diagnosis Date   Diabetes mellitus, type II ([place])    Hyperlipidemia    Hypothyroidism    Menopausal vaginal dryness     Past Surgical History:  Procedure Laterality Date   ABDOMINAL HYSTERECTOMY     APPENDECTOMY     CHOLECYSTECTOMY     TONSILLECTOMY      There were no vitals filed for this visit.   Subjective Assessment - 10/02/21 1030     Subjective I want to get better so I have been really working on my exercises. I have a treadmill but I have not been on it lately.    Currently in Pain? No/denies    Pain Score 4     Pain Orientation Left    Pain Descriptors / Indicators Sore    Pain Type Chronic pain    Aggravating Factors  prolonged walking    Pain Relieving Factors hot shower                [facility] PT Assessment - 10/02/21 0001       Observation/Other Assessments   Focus on Therapeutic Outcomes (FOTO)  59% , predicted 71%      PROM   Overall PROM Comments AROM left knee 110            [facility] Adult PT Treatment/Exercise:  Therapeutic Exercise: - Nustep L4 x 4 min, 1 minute at level 2 LE only  -Seated LAQ 10 x 2 -Supine SLR with cues for initial quad set 10 x 2 - Supine heel slide 10 x  2 -supine quad set into towel 10 x 2    Manual Therapy: - Patella mobs all planes -STW to left thigh in supine  Neuromuscular re-ed: -   Therapeutic Activity: -   Self-care/Home Management: -        PT Education - 10/02/21 1122     Education Details HEP, FOTO score and prediction.    Person(s) Educated Patient    Methods Explanation;Handout    Comprehension Verbalized understanding              PT Short Term Goals - 09/30/21 1144       PT SHORT TERM GOAL #1   Title Kallie will be >75% HEP compliant to improve carryover between sessions and facilitate independent management of condition    Target Date 10/21/21               PT Long Term Goals - 09/30/21 1145       PT LONG TERM GOAL #1   Title Target date  for all long term goals:  11/28/20      PT LONG TERM GOAL #2   Title FOTO GOAL      PT LONG TERM GOAL #3   Title Abbigael will improve 10 meter max gait speed to .7 m/s (.1 m/s MCID) to show functional improvement in ambulation  EVAL: .53 m/s      PT LONG TERM GOAL #4   Title Livianna will improve the following MMTs to >/= 4/5 to show improvement in strength:  L knee  EVAL: 3/5 limited by pain      PT LONG TERM GOAL #5   Title Eliani will be able to navigate 10 steps using reciprocal pattern, not limited by pain, to enable community ambulation  EVAL: limited by pain      Additional Long Term Goals   Additional Long Term Goals Yes      PT LONG TERM GOAL #6   Title Shara will report >/= 50% decrease in pain from evaluation  EVAL: 6/10 max pain      PT LONG TERM GOAL #7   Title Jazmyne will be able to return to golfing, not limited by pain  EVAL: limited                   Plan - 10/02/21 1051     Clinical Impression Statement Dianne reports compliance with HEP. Able to begin Nustep and reduced resistance for last minute due to fatigue. Reviewed HEP and progressed with LAQ and supine heel slides. She requires cues for initial quad set  before SLR. STW to decrease quad tension as well as patella mobs were performed today. Pt instructed in use of rolling pin for self massage. Overall session tolerated well, AROM knee flexion improved.    PT Treatment/Interventions ADLs/Self Care Home Management;Aquatic Therapy;Electrical Stimulation;Iontophoresis 4mg /ml Dexamethasone;Gait training;Therapeutic activities;Therapeutic exercise;Neuromuscular re-education;Manual techniques;Dry needling;Vasopneumatic Device    PT Next Visit Plan progressive knee strengthening as tolerated; pt voices interest in stair climbing for functional goals    PT Home Exercise Plan [phone]             Patient will benefit from skilled therapeutic intervention in order to improve the following deficits and impairments:  Abnormal gait, Difficulty walking, Decreased mobility, Decreased strength, Pain  Visit Diagnosis: Left knee pain, unspecified chronicity  Muscle weakness  Other abnormalities of gait and mobility     Problem List Patient Active Problem List   Diagnosis Date Noted   Vaccine counseling 05/21/2021   PVD (peripheral vascular disease) ([place]) 05/21/2021   Abnormal ankle brachial index ([name]) 05/21/2021   Situational anxiety 11/21/2019   Hyperlipidemia associated with type 2 diabetes mellitus ([place]) 11/21/2019   Diabetes mellitus without complication ([place]) 63/78/5885   Uncontrolled type 2 diabetes mellitus with hyperglycemia ([place]) 09/24/2019   Essential hypertension, benign 09/24/2019   Hypothyroidism 09/24/2019   Mixed hyperlipidemia 09/24/2019   Menopausal vaginal dryness    Cyst of vulva 09/11/2015   Epidermoid cyst of skin 09/11/2015   Melanocytic nevus 09/11/2015   Dysplastic nevus of skin 09/11/2015   Normal gynecologic examination 09/05/2014   Encounter for specialized medical examination 09/04/2013    Dorene Ar, PTA 10/02/2021, 11:27 AM  [facility] [address] [place], [place], [address] Phone: [phone]   Fax:  [phone]  Name: Courtney Barry MRN: [id number] Date of Birth: [birth year]

## 2021-10-10 ENCOUNTER — Other Ambulatory Visit: Payer: Self-pay | Admitting: Nurse Practitioner

## 2021-10-13 ENCOUNTER — Ambulatory Visit (INDEPENDENT_AMBULATORY_CARE_PROVIDER_SITE_OTHER): Payer: Medicare PPO | Admitting: Obstetrics & Gynecology

## 2021-10-13 ENCOUNTER — Other Ambulatory Visit: Payer: Self-pay

## 2021-10-13 ENCOUNTER — Encounter: Payer: Self-pay | Admitting: Obstetrics & Gynecology

## 2021-10-13 VITALS — BP 142/68 | HR 96 | Ht 63.5 in | Wt 172.0 lb

## 2021-10-13 DIAGNOSIS — Z9189 Other specified personal risk factors, not elsewhere classified: Secondary | ICD-10-CM | POA: Diagnosis not present

## 2021-10-13 DIAGNOSIS — Z90710 Acquired absence of both cervix and uterus: Secondary | ICD-10-CM

## 2021-10-13 DIAGNOSIS — Z90722 Acquired absence of ovaries, bilateral: Secondary | ICD-10-CM

## 2021-10-13 DIAGNOSIS — Z01419 Encounter for gynecological examination (general) (routine) without abnormal findings: Secondary | ICD-10-CM

## 2021-10-13 DIAGNOSIS — Z780 Asymptomatic menopausal state: Secondary | ICD-10-CM

## 2021-10-13 DIAGNOSIS — E119 Type 2 diabetes mellitus without complications: Secondary | ICD-10-CM

## 2021-10-13 DIAGNOSIS — Z9079 Acquired absence of other genital organ(s): Secondary | ICD-10-CM

## 2021-10-13 NOTE — Progress Notes (Signed)
Courtney Barry 25-Mar-1951 [id number]   History:    70 y.o. G2P2L2 Married.     RP:  New patient presenting for annual gyn exam    HPI: S/P TAH/BSO for Menorrhagia 2013.  No pelvic pain.  Postmenopausal, well on no HRT.  Occasional hot flushes.  Abstinent x husband's treatment for Prostate Ca.  Pap 10/01/2019 Neg. Urine and bowel movements normal.  Breasts normal. Mammo Neg 01/19/2021. Body mass index 29.99.  Walking regularly.  Followed for type 2 diabetes.  Health labs with family physician. BD 10-22-20 Normal.  [place] 05-04-13, 10 year schedule.   Past medical history,surgical history, family history and social history were all reviewed and documented in the EPIC chart.  Gynecologic History No LMP recorded. Patient has had a hysterectomy.  Obstetric History OB History  Gravida Para Term Preterm AB Living  2 2       2   SAB IAB Ectopic Multiple Live Births               # Outcome Date GA Lbr Len/2nd Weight Sex Delivery Anes PTL Lv  2 Para           1 Para              ROS: A ROS was performed and pertinent positives and negatives are included in the history.  GENERAL: No fevers or chills. HEENT: No change in vision, no earache, sore throat or sinus congestion. NECK: No pain or stiffness. CARDIOVASCULAR: No chest pain or pressure. No palpitations. PULMONARY: No shortness of breath, cough or wheeze. GASTROINTESTINAL: No abdominal pain, nausea, vomiting or diarrhea, melena or bright red blood per rectum. GENITOURINARY: No urinary frequency, urgency, hesitancy or dysuria. MUSCULOSKELETAL: No joint or muscle pain, no back pain, no recent trauma. DERMATOLOGIC: No rash, no itching, no lesions. ENDOCRINE: No polyuria, polydipsia, no heat or cold intolerance. No recent change in weight. HEMATOLOGICAL: No anemia or easy bruising or bleeding. NEUROLOGIC: No headache, seizures, numbness, tingling or weakness. PSYCHIATRIC: No depression, no loss of interest in normal activity or change in sleep  pattern.     Exam:   BP (!) 142/68   Pulse 96   Ht 5' 3.5" (1.613 m)   Wt 172 lb (78 kg)   SpO2 98%   BMI 29.99 kg/m   Body mass index is 29.99 kg/m.  General appearance : Well developed well nourished female. No acute distress HEENT: Eyes: no retinal hemorrhage or exudates,  Neck supple, trachea midline, no carotid bruits, no thyroidmegaly Lungs: Clear to auscultation, no rhonchi or wheezes, or rib retractions  Heart: Regular rate and rhythm, no murmurs or gallops Breast:Examined in sitting and supine position were symmetrical in appearance, no palpable masses or tenderness,  no skin retraction, no nipple inversion, no nipple discharge, no skin discoloration, no axillary or supraclavicular lymphadenopathy Abdomen: no palpable masses or tenderness, no rebound or guarding Extremities: no edema or skin discoloration or tenderness  Pelvic: Vulva: Normal             Vagina: No gross lesions or discharge  Cervix/Uterus absent  Adnexa  Without masses or tenderness  Anus: Normal   Assessment/Plan:  70 y.o. female for annual exam   1. Well female exam with routine gynecological exam S/P TAH/BSO for Menorrhagia 2013.  No pelvic pain.  Postmenopausal, well on no HRT.  Occasional hot flushes.  Abstinent x husband's treatment for Prostate Ca.  Pap 10/01/2019 Neg. Urine and bowel movements normal.  Breasts normal.  Mammo Neg 01/19/2021. Body mass index 29.99.  Walking regularly.  Followed for type 2 diabetes.  Health labs with family physician. BD 10-22-20 Normal.  Col[place] 05-04-13, 10 year schedule.  2. At risk for cardiovascular event  3. S/P TAH-BSO  4. Postmenopause S/P TAH/BSO for Menorrhagia 2013.  No pelvic pain.  Postmenopausal, well on no HRT.  Occasional hot flushes.  Abstinent x husband's treatment for Prostate Ca.   5. Diabetes mellitus without complication ([facility])   Princess Bruins MD, 9:30 AM 10/13/2021

## 2021-10-14 ENCOUNTER — Encounter: Payer: Self-pay | Admitting: Physical Therapy

## 2021-10-14 ENCOUNTER — Ambulatory Visit: Payer: Medicare PPO | Admitting: Physical Therapy

## 2021-10-14 DIAGNOSIS — M25562 Pain in left knee: Secondary | ICD-10-CM

## 2021-10-14 DIAGNOSIS — R2689 Other abnormalities of gait and mobility: Secondary | ICD-10-CM

## 2021-10-14 DIAGNOSIS — M6281 Muscle weakness (generalized): Secondary | ICD-10-CM

## 2021-10-14 NOTE — Patient Instructions (Signed)
Access Code: [email or web address] URL: [email or web address] Date: 10/14/2021 Prepared by: Shearon Balo  Exercises Supine Quad Set - 3 x daily - 7 x weekly - 2 sets - 10 reps - 10 hold Small Range Straight Leg Raise - 1 x daily - 7 x weekly - 3 sets - 10 reps Supine Heel Slide - 1 x daily - 7 x weekly - 3 sets - 10 reps Clamshell with Resistance - 1 x daily - 7 x weekly - 2 sets - 10 reps

## 2021-10-14 NOTE — Therapy (Signed)
[place], [place], [address] Phone: [phone]   Fax:  [phone]  Physical Therapy Treatment  Patient Details  Name: Courtney Barry MRN: [id number] Date of Birth: [birth year] Referring Provider (PT): Courtney Lung, MD   Encounter Date: 10/14/2021   PT End of Session - 10/14/21 1044     Visit Number 3    Number of Visits 16    Date for PT Re-Evaluation 11/28/20    Authorization Type [employer] MCR    Authorization Time Period 10/02/21-12/05/20-16 visits    Authorization - Visit Number 3   same as visit number   Authorization - Number of Visits 16    Progress Note Due on Visit 10    PT Start Time 1045    PT Stop Time 1126    PT Time Calculation (min) 41 min             Past Medical History:  Diagnosis Date   Diabetes mellitus, type II ([place])    Hyperlipidemia    Hypothyroidism    Menopausal vaginal dryness     Past Surgical History:  Procedure Laterality Date   ABDOMINAL HYSTERECTOMY     APPENDECTOMY     CHOLECYSTECTOMY     TONSILLECTOMY      There were no vitals filed for this visit.   Subjective Assessment - 10/14/21 1050     Subjective Pt reports that she feel her knee is stiff and sore today but she does not descrive it as painful.  She reports 7/10 soreness in her L knee                [facility] Adult PT Treatment/Exercise - 10/14/21 0001       Blood Flow Restriction   Blood Flow Restriction Yes      Blood Flow Restriction-Positions    Blood Flow Restriction Position Supine      BFR-Supine   Supine Limb Occulsion Pressure (mmHg) 200    Supine Exercise Pressure (mmHg) 15    Supine Exercise Prescription 30,15,15,15, reps w/ 30-60 sec rest    Supine Exercise Prescription Comment failure at sets 3 and 4 d/t fatige and discomfort      BFR Sitting   Sitting Exercise Prescription Comment --                   [facility] Adult PT Treatment/Exercise:    Therapeutic Exercise: - Nustep L3 x 70min - S/L clam with GTB - 2x10  - hip adduction ball squeeze - 2x10 5'' squeeze - bridge on pball - 2x10  - Supine HS stretch - contract-relax-contract x3 (L LE) -supine SAQ - BFR - 30-15-10 (stopped d/t discomfort) -Supine SLR with cues for initial quad set 5x5 -supine quad set into towel 10 x 2   Patient Education: - HEP was updated and reissued to patient; pt educated on HEP, was provided handout, and verbally confirmed understanding of exercises.      PT Short Term Goals - 09/30/21 1144       PT SHORT TERM GOAL #1   Title Courtney Barry will be >75% HEP compliant to improve carryover between sessions and facilitate independent management of condition    Target Date 10/21/21               PT Long Term Goals - 09/30/21 1145       PT LONG TERM GOAL #1   Title Target date for all long term goals:  11/28/20  PT LONG TERM GOAL #2   Title FOTO GOAL      PT LONG TERM GOAL #3   Title Courtney Barry will improve 10 meter max gait speed to .7 m/s (.1 m/s MCID) to show functional improvement in ambulation  EVAL: .53 m/s      PT LONG TERM GOAL #4   Title Courtney Barry will improve the following MMTs to >/= 4/5 to show improvement in strength:  L knee  EVAL: 3/5 limited by pain      PT LONG TERM GOAL #5   Title Courtney Barry will be able to navigate 10 steps using reciprocal pattern, not limited by pain, to enable community ambulation  EVAL: limited by pain      Additional Long Term Goals   Additional Long Term Goals Yes      PT LONG TERM GOAL #6   Title Courtney Barry will report >/= 50% decrease in pain from evaluation  EVAL: 6/10 max pain      PT LONG TERM GOAL #7   Title Courtney Barry will be able to return to golfing, not limited by pain  EVAL: limited                   Plan - 10/14/21 1137     Clinical Impression Statement ?Overall, Courtney Barry is progressing well with therapy.  Pt reports no increase in baseline pain following therapy.  Today we  concentrated on knee strengthening and hip strengthening.  Pt shows significant hip ER/abd weakness with clams which should be addressed.  Pt responded fair to BFR, but was unable to complete the last 2 sets d/t fatigue and discomfort.  Will monitor response next visit.  Pt will continue to benefit from skilled physical therapy to address remaining deficits and achieve listed goals.  Continue per POC.    PT Treatment/Interventions ADLs/Self Care Home Management;Aquatic Therapy;Electrical Stimulation;Iontophoresis 4mg /ml Dexamethasone;Gait training;Therapeutic activities;Therapeutic exercise;Neuromuscular re-education;Manual techniques;Dry needling;Vasopneumatic Device    PT Next Visit Plan progressive knee strengthening as tolerated; pt voices interest in stair climbing for functional goals    PT Home Exercise Plan [phone]             Patient will benefit from skilled therapeutic intervention in order to improve the following deficits and impairments:  Abnormal gait, Difficulty walking, Decreased mobility, Decreased strength, Pain  Visit Diagnosis: Left knee pain, unspecified chronicity  Muscle weakness  Other abnormalities of gait and mobility     Problem List Patient Active Problem List   Diagnosis Date Noted   Vaccine counseling 05/21/2021   PVD (peripheral vascular disease) ([place]) 05/21/2021   Abnormal ankle brachial index ([name]) 05/21/2021   Situational anxiety 11/21/2019   Hyperlipidemia associated with type 2 diabetes mellitus ([place]) 11/21/2019   Diabetes mellitus without complication ([place]) 24/40/1027   Uncontrolled type 2 diabetes mellitus with hyperglycemia ([place]) 09/24/2019   Essential hypertension, benign 09/24/2019   Hypothyroidism 09/24/2019   Mixed hyperlipidemia 09/24/2019   Menopausal vaginal dryness    Cyst of vulva 09/11/2015   Epidermoid cyst of skin 09/11/2015   Melanocytic nevus 09/11/2015   Dysplastic nevus of skin 09/11/2015   Normal gynecologic  examination 09/05/2014   Encounter for specialized medical examination 09/04/2013    Courtney Barry, PT 10/14/2021, 11:38 AM  [facility] [address] [place], [place], [address] Phone: [phone]   Fax:  [phone]  Name: Courtney Barry MRN: [id number] Date of Birth: [birth year]

## 2021-10-16 ENCOUNTER — Other Ambulatory Visit: Payer: Self-pay

## 2021-10-16 ENCOUNTER — Ambulatory Visit: Payer: Medicare PPO | Attending: Family Medicine | Admitting: Physical Therapy

## 2021-10-16 ENCOUNTER — Encounter: Payer: Self-pay | Admitting: Physical Therapy

## 2021-10-16 DIAGNOSIS — R2689 Other abnormalities of gait and mobility: Secondary | ICD-10-CM | POA: Diagnosis not present

## 2021-10-16 DIAGNOSIS — M25562 Pain in left knee: Secondary | ICD-10-CM | POA: Diagnosis not present

## 2021-10-16 DIAGNOSIS — M6281 Muscle weakness (generalized): Secondary | ICD-10-CM | POA: Insufficient documentation

## 2021-10-16 NOTE — Therapy (Signed)
Courtney Barry, [place], [address] Phone: [phone]   Fax:  [phone]  Physical Therapy Treatment  Patient Details  Name: Courtney Barry MRN: [id number] Date of Birth: August 26, 1951 Referring Provider (PT): Courtney Lung, MD   Encounter Date: 10/16/2021   PT End of Session - 10/16/21 0957     Visit Number 4    Number of Visits 16    Date for PT Re-Evaluation 11/28/20    Authorization Type [employer] MCR    Authorization Time Period 10/02/21-12/05/20-16 visits    Authorization - Visit Number 4   same as visit number   Authorization - Number of Visits 16    Progress Note Due on Visit 10    PT Start Time 1000    PT Stop Time 1042    PT Time Calculation (min) 42 min             Past Medical History:  Diagnosis Date   Diabetes mellitus, type II ([place])    Hyperlipidemia    Hypothyroidism    Menopausal vaginal dryness     Past Surgical History:  Procedure Laterality Date   ABDOMINAL HYSTERECTOMY     APPENDECTOMY     CHOLECYSTECTOMY     TONSILLECTOMY      There were no vitals filed for this visit.   Subjective Assessment - 10/16/21 1004     Subjective Pt reports that she feels her knee is getting stronger.  She reports 6-7/10 soreness in her L knee                               [facility] Adult PT Treatment/Exercise - 10/16/21 0001       Blood Flow Restriction   Blood Flow Restriction Yes      Blood Flow Restriction-Positions    Blood Flow Restriction Position Supine      BFR-Supine   Supine Limb Occulsion Pressure (mmHg) 190    Supine Exercise Pressure (mmHg) 133    Supine Exercise Prescription 30,15,15,15, reps w/ 30-60 sec rest    Supine Exercise Prescription Comment SAQ                   [facility] Adult PT Treatment/Exercise:   Therapeutic Exercise: - Nustep L4 x 59mn no UE - S/L clam with GTB - 3x10  - hip adduction ball squeeze - 2x10 5'' squeeze -  bridge on pball - 2x10 5'' - Supine HS stretch - contract-relax-contract x3 (L LE) -supine SAQ - BFR  -Supine SLR with cues for initial quad set 2x10 -supine quad set into towel 10 x 2          PT Short Term Goals - 10/16/21 1033       PT SHORT TERM GOAL #1   Title Courtney Barry will be >75% HEP compliant to improve carryover between sessions and facilitate independent management of condition    Baseline 12/2: MET    Status Achieved    Target Date 10/21/21               PT Long Term Goals - 10/16/21 1032       PT LONG TERM GOAL #1   Title Target date for all long term goals:  11/28/20      PT LONG TERM GOAL #2   Title Courtney Barry will improve FOTO score from 59 (on evaluation) to 73 as a proxy for functional improvement  PT LONG TERM GOAL #3   Title Courtney Barry will improve 10 meter max gait speed to .7 m/s (.1 m/s MCID) to show functional improvement in ambulation  EVAL: .53 m/s      PT LONG TERM GOAL #4   Title Courtney Barry will improve the following MMTs to >/= 4/5 to show improvement in strength:  L knee  EVAL: 3/5 limited by pain      PT LONG TERM GOAL #5   Title Courtney Barry will be able to navigate 10 steps using reciprocal pattern, not limited by pain, to enable community ambulation  EVAL: limited by pain      PT LONG TERM GOAL #6   Title Courtney Barry will report >/= 50% decrease in pain from evaluation  EVAL: 6/10 max pain      PT LONG TERM GOAL #7   Title Courtney Barry will be able to return to golfing, not limited by pain  EVAL: limited                   Plan - 10/16/21 1019     Clinical Impression Statement Overall, Courtney Barry is progressing well with therapy.  Pt reports no increase in baseline pain following therapy.  Today we concentrated on core strengthening, knee strengthening, and hip strengthening.  Pt is progress strength as expected with reported increased function at home.  Pt will continue to benefit from skilled physical therapy to address remaining deficits and  achieve listed goals.  Continue per POC.    PT Treatment/Interventions ADLs/Self Care Home Management;Aquatic Therapy;Electrical Stimulation;Iontophoresis 48m/ml Dexamethasone;Gait training;Therapeutic activities;Therapeutic exercise;Neuromuscular re-education;Manual techniques;Dry needling;Vasopneumatic Device    PT Next Visit Plan progressive knee strengthening as tolerated; pt voices interest in stair climbing for functional goals    PT Home Exercise Plan [phone]            Patient will benefit from skilled therapeutic intervention in order to improve the following deficits and impairments:  Abnormal gait, Difficulty walking, Decreased mobility, Decreased strength, Pain  Visit Diagnosis: Left knee pain, unspecified chronicity  Muscle weakness  Other abnormalities of gait and mobility     Problem List Patient Active Problem List   Diagnosis Date Noted   Vaccine counseling 05/21/2021   PVD (peripheral vascular disease) ([place] 05/21/2021   Abnormal ankle brachial index ([name]) 05/21/2021   Situational anxiety 11/21/2019   Hyperlipidemia associated with type 2 diabetes mellitus ([place] 11/21/2019   Diabetes mellitus without complication ([place] 098/26/4158  Uncontrolled type 2 diabetes mellitus with hyperglycemia ([place] 09/24/2019   Essential hypertension, benign 09/24/2019   Hypothyroidism 09/24/2019   Mixed hyperlipidemia 09/24/2019   Menopausal vaginal dryness    Cyst of vulva 09/11/2015   Epidermoid cyst of skin 09/11/2015   Melanocytic nevus 09/11/2015   Dysplastic nevus of skin 09/11/2015   Normal gynecologic examination 09/05/2014   Encounter for specialized medical examination 09/04/2013    [name] PT 10/16/2021, 10:44 AM  [facility] [address]Phone: [phone]  Fax:  [phone] Name: Courtney YonoMRN: [id number] of Birth: 1Feb 12, 1952

## 2021-10-20 ENCOUNTER — Other Ambulatory Visit: Payer: Self-pay

## 2021-10-20 ENCOUNTER — Ambulatory Visit: Payer: Medicare PPO | Admitting: Physical Therapy

## 2021-10-20 ENCOUNTER — Encounter: Payer: Self-pay | Admitting: Physical Therapy

## 2021-10-20 DIAGNOSIS — R2689 Other abnormalities of gait and mobility: Secondary | ICD-10-CM | POA: Diagnosis not present

## 2021-10-20 DIAGNOSIS — M6281 Muscle weakness (generalized): Secondary | ICD-10-CM

## 2021-10-20 DIAGNOSIS — M25562 Pain in left knee: Secondary | ICD-10-CM

## 2021-10-20 NOTE — Therapy (Signed)
[place], [place], [address] Phone: [phone]   Fax:  [phone]  Physical Therapy Treatment  Patient Details  Name: Courtney Barry MRN: [id number] Date of Birth: [birth year] Referring Provider (PT): Denita Lung, MD   Encounter Date: 10/20/2021   PT End of Session - 10/20/21 0915     Visit Number 5    Number of Visits 16    Date for PT Re-Evaluation 11/28/20    Authorization Type [employer] MCR    Authorization Time Period 10/02/21-12/05/20-16 visits    Authorization - Visit Number 5   same as visit number   Authorization - Number of Visits 16    Progress Note Due on Visit 10    PT Start Time 0915    PT Stop Time 0956    PT Time Calculation (min) 41 min             Past Medical History:  Diagnosis Date   Diabetes mellitus, type II ([place])    Hyperlipidemia    Hypothyroidism    Menopausal vaginal dryness     Past Surgical History:  Procedure Laterality Date   ABDOMINAL HYSTERECTOMY     APPENDECTOMY     CHOLECYSTECTOMY     TONSILLECTOMY      There were no vitals filed for this visit.   Subjective Assessment - 10/20/21 0920     Subjective Pt report that she sees consistent improvement.  She has been completing her HEP.  She reports 6/10 soreness in her L knee               [facility] Adult PT Treatment/Exercise - 10/20/21 0001       Blood Flow Restriction   Blood Flow Restriction Yes      Blood Flow Restriction-Positions    Blood Flow Restriction Position Supine      BFR-Supine   Supine Limb Occulsion Pressure (mmHg) 190    Supine Exercise Pressure (mmHg) 135    Supine Exercise Prescription 30,15,15,15, reps w/ 30-60 sec rest    Supine Exercise Prescription Comment SAQ - 1#                Objective:   105 degrees of flexion   [facility] Adult PT Treatment/Exercise:   Therapeutic Exercise: - Nustep L4 x 76mn no UE - S/L clam with blue TB - 2x10  - pilate's  ring squeeze - 2x10 3'' squeeze - bridge - 2x10 5'' - Supine HS stretch - contract-relax-contract x3 (L LE) -supine SAQ - BFR  -Supine SLR with cues for initial quad set 3x10     PT Short Term Goals - 10/16/21 1033       PT SHORT TERM GOAL #1   Title Kumiko will be >75% HEP compliant to improve carryover between sessions and facilitate independent management of condition    Baseline 12/2: MET    Status Achieved    Target Date 10/21/21               PT Long Term Goals - 10/16/21 1032       PT LONG TERM GOAL #1   Title Target date for all long term goals:  11/28/20      PT LONG TERM GOAL #2   Title Georgenia will improve FOTO score from 59 (on evaluation) to 73 as a proxy for functional improvement      PT LONG TERM GOAL #3   Title Henleigh will improve 10 meter max  gait speed to .7 m/s (.1 m/s MCID) to show functional improvement in ambulation  EVAL: .53 m/s      PT LONG TERM GOAL #4   Title Mayline will improve the following MMTs to >/= 4/5 to show improvement in strength:  L knee  EVAL: 3/5 limited by pain      PT LONG TERM GOAL #5   Title Markiesha will be able to navigate 10 steps using reciprocal pattern, not limited by pain, to enable community ambulation  EVAL: limited by pain      PT LONG TERM GOAL #6   Title Madalynne will report >/= 50% decrease in pain from evaluation  EVAL: 6/10 max pain      PT LONG TERM GOAL #7   Title Mande will be able to return to golfing, not limited by pain  EVAL: limited                   Plan - 10/20/21 0930     Clinical Impression Statement Overall, Leva is progressing well with therapy.  Pt reports no increase in baseline pain following therapy.  Today we concentrated on knee strengthening and hip strengthening.  Pt able to progress to bridge on mat today, which previously caused pain indicating improved function with PWB closed chain exercise.  BFR continues to be an effective strategy.  Pt will continue to benefit from  skilled physical therapy to address remaining deficits and achieve listed goals.  Continue per POC.    PT Treatment/Interventions ADLs/Self Care Home Management;Aquatic Therapy;Electrical Stimulation;Iontophoresis 23m/ml Dexamethasone;Gait training;Therapeutic activities;Therapeutic exercise;Neuromuscular re-education;Manual techniques;Dry needling;Vasopneumatic Device    PT Next Visit Plan progressive knee strengthening as tolerated; pt voices interest in stair climbing for functional goals    PT Home Exercise Plan [phone]            Patient will benefit from skilled therapeutic intervention in order to improve the following deficits and impairments:  Abnormal gait, Difficulty walking, Decreased mobility, Decreased strength, Pain  Visit Diagnosis: Left knee pain, unspecified chronicity  Muscle weakness  Other abnormalities of gait and mobility     Problem List Patient Active Problem List   Diagnosis Date Noted   Vaccine counseling 05/21/2021   PVD (peripheral vascular disease) ([place] 05/21/2021   Abnormal ankle brachial index ([name]) 05/21/2021   Situational anxiety 11/21/2019   Hyperlipidemia associated with type 2 diabetes mellitus ([place] 11/21/2019   Diabetes mellitus without complication (HV[place] 062/19/4712  Uncontrolled type 2 diabetes mellitus with hyperglycemia (HLos [place] 09/24/2019   Essential hypertension, benign 09/24/2019   Hypothyroidism 09/24/2019   Mixed hyperlipidemia 09/24/2019   Menopausal vaginal dryness    Cyst of vulva 09/11/2015   Epidermoid cyst of skin 09/11/2015   Melanocytic nevus 09/11/2015   Dysplastic nevus of skin 09/11/2015   Normal gynecologic examination 09/05/2014   Encounter for specialized medical examination 09/04/2013    Courtney Barry PT 10/20/2021, 10:00 AM  [facility] [address]Phone: [phone]  Fax:  [phone] Name: Courtney KizziahMRN: [id number] of Birth: [birth year]

## 2021-10-22 ENCOUNTER — Ambulatory Visit: Payer: Medicare PPO | Admitting: Physical Therapy

## 2021-10-22 ENCOUNTER — Other Ambulatory Visit: Payer: Self-pay

## 2021-10-22 ENCOUNTER — Encounter: Payer: Self-pay | Admitting: Physical Therapy

## 2021-10-22 DIAGNOSIS — M6281 Muscle weakness (generalized): Secondary | ICD-10-CM

## 2021-10-22 DIAGNOSIS — M25562 Pain in left knee: Secondary | ICD-10-CM | POA: Diagnosis not present

## 2021-10-22 DIAGNOSIS — R2689 Other abnormalities of gait and mobility: Secondary | ICD-10-CM | POA: Diagnosis not present

## 2021-10-22 NOTE — Therapy (Signed)
[place], [place], [address] Phone: [phone]   Fax:  [phone]  Physical Therapy Treatment  Patient Details  Name: Courtney Barry MRN: [id number] Date of Birth: 10/20/1951 Referring Provider (PT): Courtney Lung, MD   Encounter Date: 10/22/2021   PT End of Session - 10/22/21 0958     Visit Number 6    Number of Visits 16    Date for PT Re-Evaluation 11/28/20    Authorization Type [employer] MCR    Authorization Time Period 10/02/21-12/05/20-16 visits    Authorization - Visit Number 6   same as visit number   Authorization - Number of Visits 16    Progress Note Due on Visit 10    PT Start Time 1000    PT Stop Time 1042    PT Time Calculation (min) 42 min             Past Medical History:  Diagnosis Date   Diabetes mellitus, type II ([place])    Hyperlipidemia    Hypothyroidism    Menopausal vaginal dryness     Past Surgical History:  Procedure Laterality Date   ABDOMINAL HYSTERECTOMY     APPENDECTOMY     CHOLECYSTECTOMY     TONSILLECTOMY      There were no vitals filed for this visit.   Subjective Assessment - 10/22/21 1003     Subjective Pt reports that she has been doing well overall.  She was sore in her hips and back after last visit (muscles).  She feels her L knee is improving.  She reports 6/10 soreness in her L knee                [facility] Adult PT Treatment/Exercise - 10/22/21 0001       Blood Flow Restriction   Blood Flow Restriction Yes      Blood Flow Restriction-Positions    Blood Flow Restriction Position Sitting      BFR Sitting   Sitting Limb Occulsion Pressure (mmHg) 190    Sitting Exercise Pressure (mmHg) 140    Sitting Exercise Prescription 30,15,15,15, reps w/ 30-60 sec rest    Sitting Exercise Prescription Comment LAQ                   [facility] Adult PT Treatment/Exercise:   Therapeutic Exercise: - Nustep L6 x 66mn no UE - S/L clam with  blue TB - 2x10  - pilate's ring squeeze - 2x10 3'' squeeze - bridge - 2x10 5'' - Supine HS stretch - contract-relax-contract x3 (L LE) -supine LAQ - BFR  -Supine SLR with cues for initial quad set 2x15      PT Short Term Goals - 10/16/21 1033       PT SHORT TERM GOAL #1   Title Courtney Barry will be >75% HEP compliant to improve carryover between sessions and facilitate independent management of condition    Baseline 12/2: MET    Status Achieved    Target Date 10/21/21               PT Long Term Goals - 10/16/21 1032       PT LONG TERM GOAL #1   Title Target date for all long term goals:  11/28/20      PT LONG TERM GOAL #2   Title Courtney Barry will improve FOTO score from 59 (on evaluation) to 73 as a proxy for functional improvement      PT LONG TERM  GOAL #3   Title Courtney Barry will improve 10 meter max gait speed to .7 m/s (.1 m/s MCID) to show functional improvement in ambulation  EVAL: .53 m/s      PT LONG TERM GOAL #4   Title Courtney Barry will improve the following MMTs to >/= 4/5 to show improvement in strength:  L knee  EVAL: 3/5 limited by pain      PT LONG TERM GOAL #5   Title Courtney Barry will be able to navigate 10 steps using reciprocal pattern, not limited by pain, to enable community ambulation  EVAL: limited by pain      PT LONG TERM GOAL #6   Title Courtney Barry will report >/= 50% decrease in pain from evaluation  EVAL: 6/10 max pain      PT LONG TERM GOAL #7   Title Courtney Barry will be able to return to golfing, not limited by pain  EVAL: limited                   Plan - 10/22/21 1011     Clinical Impression Statement Overall, Courtney Barry is progressing well with therapy.  Pt reports no increase in baseline pain following therapy.  Today we concentrated on knee strengthening and hip strengthening.  Pt continues to show progress.  We kept intensity roughly the same d/t DOMs in low back and hips following last visit.  Moved to LAQ during BFR today with good results indicating  improved ROM arc.  Pt will continue to benefit from skilled physical therapy to address remaining deficits and achieve listed goals.  Continue per POC.    PT Treatment/Interventions ADLs/Self Care Home Management;Aquatic Therapy;Electrical Stimulation;Iontophoresis 86m/ml Dexamethasone;Gait training;Therapeutic activities;Therapeutic exercise;Neuromuscular re-education;Manual techniques;Dry needling;Vasopneumatic Device    PT Next Visit Plan progressive knee strengthening as tolerated; pt voices interest in stair climbing for functional goals    PT Home Exercise Plan [phone]            Patient will benefit from skilled therapeutic intervention in order to improve the following deficits and impairments:  Abnormal gait, Difficulty walking, Decreased mobility, Decreased strength, Pain  Visit Diagnosis: Left knee pain, unspecified chronicity  Muscle weakness  Other abnormalities of gait and mobility     Problem List Patient Active Problem List   Diagnosis Date Noted   Vaccine counseling 05/21/2021   PVD (peripheral vascular disease) ([place] 05/21/2021   Abnormal ankle brachial index ([name]) 05/21/2021   Situational anxiety 11/21/2019   Hyperlipidemia associated with type 2 diabetes mellitus (HDunn Barry 11/21/2019   Diabetes mellitus without complication (HNik[place] 017/49/4496  Uncontrolled type 2 diabetes mellitus with hyperglycemia (H[place] 09/24/2019   Essential hypertension, benign 09/24/2019   Hypothyroidism 09/24/2019   Mixed hyperlipidemia 09/24/2019   Menopausal vaginal dryness    Cyst of vulva 09/11/2015   Epidermoid cyst of skin 09/11/2015   Melanocytic nevus 09/11/2015   Dysplastic nevus of skin 09/11/2015   Normal gynecologic examination 09/05/2014   Encounter for specialized medical examination 09/04/2013    KMathis Barry PT 10/22/2021, 10:43 AM  [facility] E[address] Th[address]GAlto N[place] [address]Phone: [phone]   Fax:  [phone] Name: DLucynda RosanoMRN: [id number] of Birth: 112-18-1952

## 2021-10-27 ENCOUNTER — Other Ambulatory Visit: Payer: Self-pay

## 2021-10-27 ENCOUNTER — Ambulatory Visit: Payer: Medicare PPO | Admitting: Physical Therapy

## 2021-10-27 DIAGNOSIS — M25562 Pain in left knee: Secondary | ICD-10-CM

## 2021-10-27 DIAGNOSIS — R2689 Other abnormalities of gait and mobility: Secondary | ICD-10-CM

## 2021-10-27 DIAGNOSIS — M6281 Muscle weakness (generalized): Secondary | ICD-10-CM | POA: Diagnosis not present

## 2021-10-27 NOTE — Therapy (Signed)
[place], [place], [address] Phone: [phone]   Fax:  [phone]  Physical Therapy Treatment  Patient Details  Name: Norvella Loscalzo MRN: [id number] Date of Birth: 10/20/1951 Referring Provider (PT): Denita Lung, MD   Encounter Date: 10/27/2021   PT End of Session - 10/27/21 1449     Visit Number 7    Number of Visits 16    Date for PT Re-Evaluation 11/28/20    Authorization Type [employer] MCR    Authorization Time Period 10/02/21-12/05/20-16 visits    Authorization - Visit Number 7    Authorization - Number of Visits 16    Progress Note Due on Visit 10    PT Start Time 5830    PT Stop Time 0325    PT Time Calculation (min) 40 min             Past Medical History:  Diagnosis Date   Diabetes mellitus, type II ([place])    Hyperlipidemia    Hypothyroidism    Menopausal vaginal dryness     Past Surgical History:  Procedure Laterality Date   ABDOMINAL HYSTERECTOMY     APPENDECTOMY     CHOLECYSTECTOMY     TONSILLECTOMY      There were no vitals filed for this visit.   Subjective Assessment - 10/27/21 1447     Subjective We tried something new last time and It made me very sore.    Currently in Pain? Yes    Pain Score 6     Pain Orientation Left    Pain Descriptors / Indicators Sore    Pain Type Chronic pain    Aggravating Factors  housework    Pain Relieving Factors hot shower            [facility] Adult PT Treatment/Exercise:   Therapeutic Exercise: - Nustep L6 x 77mn no UE - Stair negotiation- alternating up 6 inch steps , step to pattern to descend- - Supine clam with blue TB - 2x10  - bridge - 2x10 5'' - Supine HS stretch - 30 sec x 2  -Supine SLR with cues for initial quad set 2 x 10 RT and LT  -hip flexor stretch left at edge of mat 30 sec x 2    [facility] Adult PT Treatment/Exercise - 10/27/21 0001       Ambulation/Gait   Stairs Yes    Stair Management Technique  Step to pattern;Alternating pattern    Number of Stairs 12    Height of Stairs 6    Gait Comments alternating pattern to climb, needs step to pattern to descend stairs              PT Short Term Goals - 10/16/21 1033       PT SHORT TERM GOAL #1   Title Fayrene will be >75% HEP compliant to improve carryover between sessions and facilitate independent management of condition    Baseline 12/2: MET    Status Achieved    Target Date 10/21/21               PT Long Term Goals - 10/16/21 1032       PT LONG TERM GOAL #1   Title Target date for all long term goals:  11/28/20      PT LONG TERM GOAL #2   Title Jesslynn will improve FOTO score from 59 (on evaluation) to 73 as a proxy for functional improvement  PT LONG TERM GOAL #3   Title Tere will improve 10 meter max gait speed to .7 m/s (.1 m/s MCID) to show functional improvement in ambulation    EVAL: .53 m/s      PT LONG TERM GOAL #4   Title Avree will improve the following MMTs to >/= 4/5 to show improvement in strength:  L knee    EVAL: 3/5 limited by pain      PT LONG TERM GOAL #5   Title Joletta will be able to navigate 10 steps using reciprocal pattern, not limited by pain, to enable community ambulation    EVAL: limited by pain      PT LONG TERM GOAL #6   Title Aren will report >/= 50% decrease in pain from evaluation    EVAL: 6/10 max pain      PT LONG TERM GOAL #7   Title Lonie will be able to return to golfing, not limited by pain    EVAL: limited                   Plan - 10/27/21 1520     Clinical Impression Statement Pt reports overall improvement with some increased soreness after last session that lasted 24 hours. She reports she has been using the garage stairs and we went over stair negotiation in clinic today. She was able to climb reciprocal stairs however required step to pattern to descend due to left knee pain. Continued with hip and knee strengthening with min c/o  fatigue and soreness.     PT Treatment/Interventions ADLs/Self Care Home Management;Aquatic Therapy;Electrical Stimulation;Iontophoresis 52m/ml Dexamethasone;Gait training;Therapeutic activities;Therapeutic exercise;Neuromuscular re-education;Manual techniques;Dry needling;Vasopneumatic Device    PT Next Visit Plan progressive knee strengthening as tolerated; pt voices interest in stair climbing for functional goals , Need FOTO    PT Home Exercise Plan [phone]            Patient will benefit from skilled therapeutic intervention in order to improve the following deficits and impairments:  Abnormal gait, Difficulty walking, Decreased mobility, Decreased strength, Pain  Visit Diagnosis: Left knee pain, unspecified chronicity  Muscle weakness  Other abnormalities of gait and mobility     Problem List Patient Active Problem List   Diagnosis Date Noted   Vaccine counseling 05/21/2021   PVD (peripheral vascular disease) (HSt[place] 05/21/2021   Abnormal ankle brachial index ([name]) 05/21/2021   Situational anxiety 11/21/2019   Hyperlipidemia associated with type 2 diabetes mellitus (HKn[place] 11/21/2019   Diabetes mellitus without complication (HNorth [place] 094/32/7614  Uncontrolled type 2 diabetes mellitus with hyperglycemia (HByn[place] 09/24/2019   Essential hypertension, benign 09/24/2019   Hypothyroidism 09/24/2019   Mixed hyperlipidemia 09/24/2019   Menopausal vaginal dryness    Cyst of vulva 09/11/2015   Epidermoid cyst of skin 09/11/2015   Melanocytic nevus 09/11/2015   Dysplastic nevus of skin 09/11/2015   Normal gynecologic examination 09/05/2014   Encounter for specialized medical examination 09/04/2013    DDorene Ar PTA 10/27/2021, 3:29 PM  [facility]GPocahontas [place] [address]Phone: [phone]  Fax:  [phone] Name: DShaine MountMRN: [id number] of Birth: [birth year]

## 2021-10-29 ENCOUNTER — Other Ambulatory Visit: Payer: Self-pay

## 2021-10-29 ENCOUNTER — Ambulatory Visit: Payer: Medicare PPO | Admitting: Physical Therapy

## 2021-10-29 ENCOUNTER — Encounter: Payer: Self-pay | Admitting: Physical Therapy

## 2021-10-29 DIAGNOSIS — R2689 Other abnormalities of gait and mobility: Secondary | ICD-10-CM

## 2021-10-29 DIAGNOSIS — M25562 Pain in left knee: Secondary | ICD-10-CM | POA: Diagnosis not present

## 2021-10-29 DIAGNOSIS — M6281 Muscle weakness (generalized): Secondary | ICD-10-CM | POA: Diagnosis not present

## 2021-10-29 DIAGNOSIS — E1165 Type 2 diabetes mellitus with hyperglycemia: Secondary | ICD-10-CM | POA: Diagnosis not present

## 2021-10-29 NOTE — Therapy (Addendum)
[place], [place], [address] Phone: [phone]   Fax:  [phone]  Physical Therapy Treatment  Patient Details  Name: Courtney Barry MRN: [id number] Date of Birth: 08/26/1951 Referring Provider (PT): Denita Lung, MD   Encounter Date: 10/29/2021   PT End of Session - 10/29/21 1131     Visit Number 8    Number of Visits 16    Date for PT Re-Evaluation 11/28/20    Authorization Type [employer] MCR    Authorization Time Period 10/02/21-12/05/20-16 visits    Authorization - Visit Number 8    Authorization - Number of Visits 16    Progress Note Due on Visit 10            In: 11:30 Out 12:15 Total time: 45 min  Past Medical History:  Diagnosis Date   Diabetes mellitus, type II ([place])    Hyperlipidemia    Hypothyroidism    Menopausal vaginal dryness     Past Surgical History:  Procedure Laterality Date   ABDOMINAL HYSTERECTOMY     APPENDECTOMY     CHOLECYSTECTOMY     TONSILLECTOMY      There were no vitals filed for this visit.   Subjective Assessment - 10/29/21 1135     Subjective Pt reports that her R hip was sore after last visit, overall she is happy with her progress.  6/10 "soreness" in L knee with 0/10 pain.                               [place] Adult PT Treatment/Exercise - 10/29/21 0001       BFR Sitting   Sitting Limb Occulsion Pressure (mmHg) 210    Sitting Exercise Pressure (mmHg) 155    Sitting Exercise Prescription 30,15,15,15, reps w/ 30-60 sec rest    Sitting Exercise Prescription Comment SAQ - 2#                   [place] Adult PT Treatment/Exercise:   Therapeutic Exercise: - Nustep L6 x 14mn no UE - Slant board stretch - 30''x3 - Heel raise - 2x20'' - S/L clam with blue TB - 2x10  - pilate's ring squeeze - 2x10 5'' squeeze - bridge - 10'' x5 - supine SAQ - BFR - 2#  Neuromuscular re-ed, to improve balance and reduce fall  risk: - Tandem stance 45'' bouts      PT Short Term Goals - 10/16/21 1033       PT SHORT TERM GOAL #1   Title Roselynn will be >75% HEP compliant to improve carryover between sessions and facilitate independent management of condition    Baseline 12/2: MET    Status Achieved    Target Date 10/21/21               PT Long Term Goals - 10/16/21 1032       PT LONG TERM GOAL #1   Title Target date for all long term goals:  11/28/20      PT LONG TERM GOAL #2   Title Kimari will improve FOTO score from 59 (on evaluation) to 73 as a proxy for functional improvement      PT LONG TERM GOAL #3   Title Rasheema will improve 10 meter max gait speed to .7 m/s (.1 m/s MCID) to show functional improvement in ambulation    EVAL: .53 m/s  PT LONG TERM GOAL #4   Title Felicitas will improve the following MMTs to >/= 4/5 to show improvement in strength:  L knee    EVAL: 3/5 limited by pain      PT LONG TERM GOAL #5   Title Loree will be able to navigate 10 steps using reciprocal pattern, not limited by pain, to enable community ambulation    EVAL: limited by pain      PT LONG TERM GOAL #6   Title Hisae will report >/= 50% decrease in pain from evaluation    EVAL: 6/10 max pain      PT LONG TERM GOAL #7   Title Kensli will be able to return to golfing, not limited by pain    EVAL: limited                   Plan - 10/29/21 1206     Clinical Impression Statement Overall, Sira is progressing well with therapy.  Pt reports no increase in baseline pain following therapy.  Today we concentrated on knee strengthening and hip strengthening.  Pt with significant gastroc tightness L>R during slant board stretch which should be addressed.  Reverted to SAQ for BFR d/t previous irirtation.  Pt will continue to benefit from skilled physical therapy to address remaining deficits and achieve listed goals.  Continue per POC.    PT Treatment/Interventions ADLs/Self Care Home Management;Aquatic  Therapy;Electrical Stimulation;Iontophoresis 23m/ml Dexamethasone;Gait training;Therapeutic activities;Therapeutic exercise;Neuromuscular re-education;Manual techniques;Dry needling;Vasopneumatic Device    PT Next Visit Plan progressive knee strengthening as tolerated; pt voices interest in stair climbing for functional goals , Need FOTO    PT Home Exercise Plan [phone]            Patient will benefit from skilled therapeutic intervention in order to improve the following deficits and impairments:  Abnormal gait, Difficulty walking, Decreased mobility, Decreased strength, Pain  Visit Diagnosis: Left knee pain, unspecified chronicity  Other abnormalities of gait and mobility  Muscle weakness     Problem List Patient Active Problem List   Diagnosis Date Noted   Vaccine counseling 05/21/2021   PVD (peripheral vascular disease) (HFidd[place] 05/21/2021   Abnormal ankle brachial index ([name]) 05/21/2021   Situational anxiety 11/21/2019   Hyperlipidemia associated with type 2 diabetes mellitus (HFerr[place] 11/21/2019   Diabetes mellitus without complication (HH[place] 058/72/7618  Uncontrolled type 2 diabetes mellitus with hyperglycemia (HO[place] 09/24/2019   Essential hypertension, benign 09/24/2019   Hypothyroidism 09/24/2019   Mixed hyperlipidemia 09/24/2019   Menopausal vaginal dryness    Cyst of vulva 09/11/2015   Epidermoid cyst of skin 09/11/2015   Melanocytic nevus 09/11/2015   Dysplastic nevus of skin 09/11/2015   Normal gynecologic examination 09/05/2014   Encounter for specialized medical examination 09/04/2013    [name] PT 10/29/2021, 12:15 PM  [facility]GMal[address] [place] [address]Phone: [phone]  Fax:  [phone] Name: Courtney CaputoMRN: [id number] of Birth: 107/10/1951

## 2021-11-04 ENCOUNTER — Ambulatory Visit: Payer: Medicare PPO | Admitting: Physical Therapy

## 2021-11-04 ENCOUNTER — Other Ambulatory Visit: Payer: Self-pay

## 2021-11-04 ENCOUNTER — Encounter: Payer: Self-pay | Admitting: Physical Therapy

## 2021-11-04 DIAGNOSIS — R2689 Other abnormalities of gait and mobility: Secondary | ICD-10-CM | POA: Diagnosis not present

## 2021-11-04 DIAGNOSIS — M6281 Muscle weakness (generalized): Secondary | ICD-10-CM

## 2021-11-04 DIAGNOSIS — M25562 Pain in left knee: Secondary | ICD-10-CM

## 2021-11-04 NOTE — Therapy (Signed)
[facility] [place], [place], [address] Phone: [phone]   Fax:  [phone]  Physical Therapy Treatment  Patient Details  Name: Courtney Barry MRN: [id number] Date of Birth: 12-15-1950 Referring Provider (PT): Denita Lung, MD   Encounter Date: 11/04/2021   PT End of Session - 11/04/21 1044     Visit Number 9    Number of Visits 16    Date for PT Re-Evaluation 11/28/20    Authorization Type [employer] MCR    Authorization Time Period 10/02/21-12/05/20-16 visits    Authorization - Visit Number 9    Authorization - Number of Visits 16    Progress Note Due on Visit 10    PT Start Time 5462    PT Stop Time 7035    PT Time Calculation (min) 43 min             Past Medical History:  Diagnosis Date   Diabetes mellitus, type II ([place])    Hyperlipidemia    Hypothyroidism    Menopausal vaginal dryness     Past Surgical History:  Procedure Laterality Date   ABDOMINAL HYSTERECTOMY     APPENDECTOMY     CHOLECYSTECTOMY     TONSILLECTOMY      There were no vitals filed for this visit.   Subjective Assessment - 11/04/21 1048     Subjective Pt reports that she continues to improve.  She reports her family says she is limping less.  6/10 "soreness" in L knee with 0/10 pain.              [facility] Adult PT Treatment/Exercise:   Therapeutic Exercise: - Nustep L7 x 36mn no UE - Slant board stretch - 45''x3 - Knee ext machine - 10# - 3x10 - knee flexion machine - 25# - 3x10 - Heel raise on step 3x10 - S/L hip abduction 2x10 ea - pilate's ring squeeze - 2x10 5'' squeeze (NT) - bridge - 3x30'' - Leg press - 10x - d/c d/t pain   Neuromuscular re-ed, to improve balance and reduce fall risk: - Tandem stance 45'' bouts - on foam    PT Short Term Goals - 10/16/21 1033       PT SHORT TERM GOAL #1   Title Kaleeyah will be >75% HEP compliant to improve carryover between sessions and facilitate independent  management of condition    Baseline 12/2: MET    Status Achieved    Target Date 10/21/21               PT Long Term Goals - 10/16/21 1032       PT LONG TERM GOAL #1   Title Target date for all long term goals:  11/28/20      PT LONG TERM GOAL #2   Title Deina will improve FOTO score from 59 (on evaluation) to 73 as a proxy for functional improvement      PT LONG TERM GOAL #3   Title Treva will improve 10 meter max gait speed to .7 m/s (.1 m/s MCID) to show functional improvement in ambulation    EVAL: .53 m/s      PT LONG TERM GOAL #4   Title Tylan will improve the following MMTs to >/= 4/5 to show improvement in strength:  L knee    EVAL: 3/5 limited by pain      PT LONG TERM GOAL #5   Title Ailyne will be able to navigate 10 steps using  reciprocal pattern, not limited by pain, to enable community ambulation    EVAL: limited by pain      PT LONG TERM GOAL #6   Title Wonder will report >/= 50% decrease in pain from evaluation    EVAL: 6/10 max pain      PT LONG TERM GOAL #7   Title Dequita will be able to return to golfing, not limited by pain    EVAL: limited                   Plan - 11/04/21 1214     Clinical Impression Statement Tamberly is progressing well with therapy.  Pt reports no increase in baseline pain following therapy.  Today we concentrated on quad strengthening, hip strengthening, and balance/proprioception.  Pt continues to show functional improvement with less pain in walking and step navigation, however she is still limited in closed chain flexion activities, with increase pain at greater flexion; she was unable to tolerate the leg press even with light weight at flexion >80 degrees.  Pt will continue to benefit from skilled physical therapy to address remaining deficits and achieve listed goals.  Continue per POC.    PT Treatment/Interventions ADLs/Self Care Home Management;Aquatic Therapy;Electrical Stimulation;Iontophoresis 72m/ml Dexamethasone;Gait  training;Therapeutic activities;Therapeutic exercise;Neuromuscular re-education;Manual techniques;Dry needling;Vasopneumatic Device    PT Next Visit Plan progressive knee strengthening as tolerated; pt voices interest in stair climbing for functional goals , Need FOTO    PT Home Exercise Plan [id number]            Patient will benefit from skilled therapeutic intervention in order to improve the following deficits and impairments:  Abnormal gait, Difficulty walking, Decreased mobility, Decreased strength, Pain  Visit Diagnosis: Left knee pain, unspecified chronicity  Other abnormalities of gait and mobility  Muscle weakness     Problem List Patient Active Problem List   Diagnosis Date Noted   Vaccine counseling 05/21/2021   PVD (peripheral vascular disease) ([place] 05/21/2021   Abnormal ankle brachial index ([name]) 05/21/2021   Situational anxiety 11/21/2019   Hyperlipidemia associated with type 2 diabetes mellitus ([place] 11/21/2019   Diabetes mellitus without complication (H[place] 075/91/6384  Uncontrolled type 2 diabetes mellitus with hyperglycemia ([place] 09/24/2019   Essential hypertension, benign 09/24/2019   Hypothyroidism 09/24/2019   Mixed hyperlipidemia 09/24/2019   Menopausal vaginal dryness    Cyst of vulva 09/11/2015   Epidermoid cyst of skin 09/11/2015   Melanocytic nevus 09/11/2015   Dysplastic nevus of skin 09/11/2015   Normal gynecologic examination 09/05/2014   Encounter for specialized medical examination 09/04/2013    [name] PT 11/04/2021, 12:14 PM  [facility] LLC[address]GCana [address] [address]Phone: [phone]  Fax:  [phone] Name: DShaquella StamantMRN: [id number] of Birth: 109/21/1952

## 2021-11-10 ENCOUNTER — Ambulatory Visit: Payer: Medicare PPO | Admitting: Physical Therapy

## 2021-11-10 ENCOUNTER — Other Ambulatory Visit: Payer: Self-pay

## 2021-11-10 ENCOUNTER — Encounter: Payer: Self-pay | Admitting: Physical Therapy

## 2021-11-10 DIAGNOSIS — R2689 Other abnormalities of gait and mobility: Secondary | ICD-10-CM

## 2021-11-10 DIAGNOSIS — M25562 Pain in left knee: Secondary | ICD-10-CM

## 2021-11-10 DIAGNOSIS — M6281 Muscle weakness (generalized): Secondary | ICD-10-CM | POA: Diagnosis not present

## 2021-11-10 NOTE — Therapy (Addendum)
Courtney Barry, [place], [address] Phone: [phone]   Fax:  [phone]  Progress Note Reporting Period 11/16 to 12/27  See note below for Objective Data and Assessment of Progress/Goals.   Patient Details  Name: Courtney Barry MRN: [id number] Date of Birth: [birth year] Referring Provider (PT): Courtney Lung, MD   Encounter Date: 11/10/2021   PT End of Session - 11/10/21 1023     Visit Number 10    Number of Visits 16    Date for PT Re-Evaluation 11/28/20    Authorization Type [employer] MCR    Authorization Time Period 10/02/21-12/05/20-16 visits    Authorization - Visit Number 10    Authorization - Number of Visits 16    PT Start Time 1660             Past Medical History:  Diagnosis Date   Diabetes mellitus, type II ([place])    Hyperlipidemia    Hypothyroidism    Menopausal vaginal dryness     Past Surgical History:  Procedure Laterality Date   ABDOMINAL HYSTERECTOMY     APPENDECTOMY     CHOLECYSTECTOMY     TONSILLECTOMY      There were no vitals filed for this visit.   Subjective Assessment - 11/10/21 1020     Subjective Pt reports the right knee was bothersome over the holiday due to standing 6-7 hours for holiday prep.    Currently in Pain? Yes    Pain Score 6     Pain Location Knee    Pain Orientation Left    Pain Descriptors / Indicators Sore    Pain Type Chronic pain    Aggravating Factors  standing    Pain Relieving Factors hot shower, tylenol            [facility] Adult PT Treatment/Exercise:   Therapeutic Exercise: - Nustep L7 x 31mn no UE - up and down 20 stairs with 1 or 2 HR -6 inch step up x 10 each with 1 HR  - Heel raise x20 - Slant board stretch - 45''x3 - knee flexion machine - 20# - 3x10 - Leg press - 20# x 20  - seated hamstring stretch 30 x 2    Neuromuscular re-ed, to improve balance and reduce fall risk: - Tandem stance 45'' bouts - on foam                PT Short Term Goals - 10/16/21 1033       PT SHORT TERM GOAL #1   Title Courtney Barry will be >75% HEP compliant to improve carryover between sessions and facilitate independent management of condition    Baseline 12/2: MET    Status Achieved    Target Date 10/21/21               PT Long Term Goals - 11/10/21 1039       PT LONG TERM GOAL #1   Title Target date for all long term goals:  11/28/20      PT LONG TERM GOAL #2   Title Courtney Barry will improve FOTO score from 59 (on evaluation) to 73 as a proxy for functional improvement    Baseline 11/10/21 improved to 65    Period Weeks    Status On-going      PT LONG TERM GOAL #3   Title Courtney Barry will improve 10 meter max gait speed to .7 m/s (.1 m/s MCID) to show functional improvement in  ambulation    EVAL: .53 m/s    Status On-going      PT LONG TERM GOAL #4   Title Courtney Barry will improve the following MMTs to >/= 4/5 to show improvement in strength:  L knee    EVAL: 3/5 limited by pain; 11/10/21 Knee ext 5/5, knee flex 4/5    Status Achieved      PT LONG TERM GOAL #5   Title Courtney Barry will be able to navigate 10 steps using reciprocal pattern, not limited by pain, to enable community ambulation    EVAL: limited by pain; 11/10/21- pt reports no difficulty with task    Status Achieved      PT LONG TERM GOAL #6   Title Courtney Barry will report >/= 50% decrease in pain from evaluation    EVAL: 6/10 max pain; 11/10/21 pt reports overall improvement to 50% or more    Status Achieved      PT LONG TERM GOAL #7   Title Courtney Barry will be able to return to golfing, not limited by pain    EVAL: limited; 11/10/21- plans to try golf Friday- has been putting downt the hall without difficulty.    Status On-going                   Plan - 11/10/21 1048     Clinical Impression Statement Courtney Barry is able to complete reciprocal stairs without increased pain and reports no difficulty, using single rail to rise and bilat rails to descend. Her  knee strength has improved to target. Her FOTO score has improved to half of prediction. She reports overall >50% decrease in pain since starting physical therapy. She is agreeable to reduce frequency to 1 x per week for remainder of POC to reach remaining LTGs. She has met LTG #4,5,6.    PT Treatment/Interventions ADLs/Self Care Home Management;Aquatic Therapy;Electrical Stimulation;Iontophoresis 43m/ml Dexamethasone;Gait training;Therapeutic activities;Therapeutic exercise;Neuromuscular re-education;Manual techniques;Dry needling;Vasopneumatic Device    PT Next Visit Plan check remaining LTGs, closed chain    PT Home Exercise Plan [id number]            Patient will benefit from skilled therapeutic intervention in order to improve the following deficits and impairments:  Abnormal gait, Difficulty walking, Decreased mobility, Decreased strength, Pain  Visit Diagnosis: Left knee pain, unspecified chronicity  Other abnormalities of gait and mobility  Muscle weakness     Problem List Patient Active Problem List   Diagnosis Date Noted   Vaccine counseling 05/21/2021   PVD (peripheral vascular disease) (HFair[place] 05/21/2021   Abnormal ankle brachial index ([name]) 05/21/2021   Situational anxiety 11/21/2019   Hyperlipidemia associated with type 2 diabetes mellitus (HNept[place] 11/21/2019   Diabetes mellitus without complication (HR[place] 053/61/4431  Uncontrolled type 2 diabetes mellitus with hyperglycemia (HE[place] 09/24/2019   Essential hypertension, benign 09/24/2019   Hypothyroidism 09/24/2019   Mixed hyperlipidemia 09/24/2019   Menopausal vaginal dryness    Cyst of vulva 09/11/2015   Epidermoid cyst of skin 09/11/2015   Melanocytic nevus 09/11/2015   Dysplastic nevus of skin 09/11/2015   Normal gynecologic examination 09/05/2014   Encounter for specialized medical examination 09/04/2013    DDorene Ar PTA 11/10/2021, [address] [address]Phone: [phone]  Fax:  [phone] Name: DArlisa LeclereMRN: [id number] of Birth: 103-03-1951

## 2021-11-11 DIAGNOSIS — E039 Hypothyroidism, unspecified: Secondary | ICD-10-CM | POA: Diagnosis not present

## 2021-11-11 DIAGNOSIS — Z7984 Long term (current) use of oral hypoglycemic drugs: Secondary | ICD-10-CM | POA: Diagnosis not present

## 2021-11-11 DIAGNOSIS — E119 Type 2 diabetes mellitus without complications: Secondary | ICD-10-CM | POA: Diagnosis not present

## 2021-11-11 DIAGNOSIS — Z7985 Long-term (current) use of injectable non-insulin antidiabetic drugs: Secondary | ICD-10-CM | POA: Diagnosis not present

## 2021-11-11 DIAGNOSIS — E785 Hyperlipidemia, unspecified: Secondary | ICD-10-CM | POA: Diagnosis not present

## 2021-11-11 DIAGNOSIS — Z794 Long term (current) use of insulin: Secondary | ICD-10-CM | POA: Diagnosis not present

## 2021-11-11 DIAGNOSIS — I10 Essential (primary) hypertension: Secondary | ICD-10-CM | POA: Diagnosis not present

## 2021-11-12 ENCOUNTER — Other Ambulatory Visit: Payer: Self-pay

## 2021-11-12 ENCOUNTER — Encounter: Payer: Self-pay | Admitting: Physical Therapy

## 2021-11-12 ENCOUNTER — Ambulatory Visit: Payer: Medicare PPO | Admitting: Physical Therapy

## 2021-11-12 DIAGNOSIS — M25562 Pain in left knee: Secondary | ICD-10-CM | POA: Diagnosis not present

## 2021-11-12 DIAGNOSIS — R2689 Other abnormalities of gait and mobility: Secondary | ICD-10-CM | POA: Diagnosis not present

## 2021-11-12 DIAGNOSIS — M6281 Muscle weakness (generalized): Secondary | ICD-10-CM

## 2021-11-12 NOTE — Therapy (Signed)
[place], [place], [address] Phone: [phone]   Fax:  [phone]  Physical Therapy Treatment  Patient Details  Name: Courtney Barry MRN: [id number] Date of Birth: 08-23-1951 Referring Provider (PT): Denita Lung, MD   Encounter Date: 11/12/2021   PT End of Session - 11/12/21 1046     Visit Number 11    Number of Visits 16    Date for PT Re-Evaluation 11/28/20    Authorization Type [employer] MCR    Authorization Time Period 10/02/21-12/05/20-16 visits    Authorization - Visit Number 10    Authorization - Number of Visits 16    PT Start Time 1638    PT Stop Time 1128    PT Time Calculation (min) 43 min             Past Medical History:  Diagnosis Date   Diabetes mellitus, type II ([place])    Hyperlipidemia    Hypothyroidism    Menopausal vaginal dryness     Past Surgical History:  Procedure Laterality Date   ABDOMINAL HYSTERECTOMY     APPENDECTOMY     CHOLECYSTECTOMY     TONSILLECTOMY      There were no vitals filed for this visit.   Subjective Assessment - 11/12/21 1051     Subjective Pt reports that overall her knee continues to improve.  She has 5/10 L knee "soreness" today but reports 0/10 pain.               [facility] Adult PT Treatment/Exercise:   Therapeutic Exercise: - Nustep L7 x 47mn no UE - 6 inch step up 2x10 each with march and heel raise - Heel raise  on step x20 - Slant board stretch - 45''x3 - knee flexion machine - 25# - 3x10 - Leg press - 25# 3x10 - seated hamstring stretch 30 x 2 - knee ext machine bil - 5# - 3x10   Neuromuscular re-ed, to improve balance and reduce fall risk: - Tandem stance 45'' bouts - on foam  - SLS - 20'' bouts     PT Short Term Goals - 10/16/21 1033       PT SHORT TERM GOAL #1   Title Zaniah will be >75% HEP compliant to improve carryover between sessions and facilitate independent management of condition    Baseline  12/2: MET    Status Achieved    Target Date 10/21/21               PT Long Term Goals - 11/10/21 1039       PT LONG TERM GOAL #1   Title Target date for all long term goals:  11/28/20      PT LONG TERM GOAL #2   Title Peta will improve FOTO score from 59 (on evaluation) to 73 as a proxy for functional improvement    Baseline 11/10/21 improved to 65    Period Weeks    Status On-going      PT LONG TERM GOAL #3   Title Anaisha will improve 10 meter max gait speed to .7 m/s (.1 m/s MCID) to show functional improvement in ambulation    EVAL: .53 m/s    Status On-going      PT LONG TERM GOAL #4   Title Carlos will improve the following MMTs to >/= 4/5 to show improvement in strength:  L knee    EVAL: 3/5 limited by pain; 11/10/21 Knee ext 5/5, knee flex  4/5    Status Achieved      PT LONG TERM GOAL #5   Title Marija will be able to navigate 10 steps using reciprocal pattern, not limited by pain, to enable community ambulation    EVAL: limited by pain; 11/10/21- pt reports no difficulty with task    Status Achieved      PT LONG TERM GOAL #6   Title Satina will report >/= 50% decrease in pain from evaluation    EVAL: 6/10 max pain; 11/10/21 pt reports overall improvement to 50% or more    Status Achieved      PT LONG TERM GOAL #7   Title Biance will be able to return to golfing, not limited by pain    EVAL: limited; 11/10/21- plans to try golf Friday- has been putting downt the hall without difficulty.    Status On-going                   Plan - 11/12/21 1118     Clinical Impression Statement Arianny is progressing well with therapy.  Pt reports a mild increase in pain following therapy.  Today we concentrated on knee strengthening and hip strengthening.  Pt is showing progress with closed chain knee strengthening, but does have some increase in pain with step ups (this is improving however).  Pt will continue to benefit from skilled physical therapy to address remaining  deficits and achieve listed goals.  Continue per POC.    PT Treatment/Interventions ADLs/Self Care Home Management;Aquatic Therapy;Electrical Stimulation;Iontophoresis 74m/ml Dexamethasone;Gait training;Therapeutic activities;Therapeutic exercise;Neuromuscular re-education;Manual techniques;Dry needling;Vasopneumatic Device    PT Next Visit Plan check remaining LTGs, closed chain    PT Home Exercise Plan [phone]            Patient will benefit from skilled therapeutic intervention in order to improve the following deficits and impairments:  Abnormal gait, Difficulty walking, Decreased mobility, Decreased strength, Pain  Visit Diagnosis: Left knee pain, unspecified chronicity  Other abnormalities of gait and mobility  Muscle weakness     Problem List Patient Active Problem List   Diagnosis Date Noted   Vaccine counseling 05/21/2021   PVD (peripheral vascular disease) (HW[place] 05/21/2021   Abnormal ankle brachial index ([name]) 05/21/2021   Situational anxiety 11/21/2019   Hyperlipidemia associated with type 2 diabetes mellitus (HT[place] 11/21/2019   Diabetes mellitus without complication (HP[place] 094/05/6807  Uncontrolled type 2 diabetes mellitus with hyperglycemia (HO[place] 09/24/2019   Essential hypertension, benign 09/24/2019   Hypothyroidism 09/24/2019   Mixed hyperlipidemia 09/24/2019   Menopausal vaginal dryness    Cyst of vulva 09/11/2015   Epidermoid cyst of skin 09/11/2015   Melanocytic nevus 09/11/2015   Dysplastic nevus of skin 09/11/2015   Normal gynecologic examination 09/05/2014   Encounter for specialized medical examination 09/04/2013    [name] PT 11/12/2021, 11:29 AM  [facility]GCheyenne N[place] [address]Phone: [phone]  Fax:  [phone] Name: DKaylan YatesMRN: [id number] of Birth: [birth year]

## 2021-11-18 ENCOUNTER — Other Ambulatory Visit: Payer: Self-pay

## 2021-11-18 ENCOUNTER — Encounter: Payer: Self-pay | Admitting: Physical Therapy

## 2021-11-18 ENCOUNTER — Ambulatory Visit: Payer: Medicare PPO | Attending: Family Medicine | Admitting: Physical Therapy

## 2021-11-18 DIAGNOSIS — M25562 Pain in left knee: Secondary | ICD-10-CM | POA: Insufficient documentation

## 2021-11-18 DIAGNOSIS — R2689 Other abnormalities of gait and mobility: Secondary | ICD-10-CM | POA: Insufficient documentation

## 2021-11-18 DIAGNOSIS — M6281 Muscle weakness (generalized): Secondary | ICD-10-CM | POA: Insufficient documentation

## 2021-11-18 NOTE — Therapy (Signed)
Courtney Barry, [place], [address] Phone: [phone]   Fax:  [phone]  Physical Therapy Treatment  Patient Details  Name: Courtney Barry MRN: [id number] Date of Birth: Jan 17, 1951 Referring Provider (PT): Courtney Lung, MD   Encounter Date: 11/18/2021   PT End of Session - 11/18/21 1150     Visit Number 12    Number of Visits 16    Date for PT Re-Evaluation 11/28/20    Authorization Type [employer] MCR    Authorization Time Period 10/02/21-12/05/20-16 visits    Authorization - Visit Number 10    Authorization - Number of Visits 16    Progress Note Due on Visit 20    PT Start Time 6962    PT Stop Time 1228    PT Time Calculation (min) 42 min             Past Medical History:  Diagnosis Date   Diabetes mellitus, type II ([place])    Hyperlipidemia    Hypothyroidism    Menopausal vaginal dryness     Past Surgical History:  Procedure Laterality Date   ABDOMINAL HYSTERECTOMY     APPENDECTOMY     CHOLECYSTECTOMY     TONSILLECTOMY      There were no vitals filed for this visit.   Subjective Assessment - 11/18/21 1150     Subjective 5/10 stiff and a little sore. I am happy when it is a 5/10.    Currently in Pain? Yes    Pain Score 5     Pain Location Knee    Pain Orientation Left    Pain Descriptors / Indicators Sore    Pain Type Chronic pain    Aggravating Factors  standing, stepping off the curb    Pain Relieving Factors hot shower, tylenol            [facility] Adult PT Treatment/Exercise:   Therapeutic Exercise: - Nustep L7 x 63min no UE -gastroc stretch on slant board  30 sec x 3 -intermittently - heel raise on step 10 x 2  - 6 inch step up 1x10 each with march and heel raise (omitted heel raise on left due to pain) - seated Leg press - 35# 3x10 - knee ext machine bil - 5# - 3x10 - knee flexion machine - 25# - 3x10 - seated hamstring stretch 30 x 2 bilat -hip flexor stretch EOM 30  sec x 2 left     Neuromuscular re-ed, to improve balance and reduce fall risk: - Tandem stance 45'' bouts - on foam  - SLS - on airex  12 sec L, 9 sec R            PT Short Term Goals - 10/16/21 1033       PT SHORT TERM GOAL #1   Title Courtney Barry will be >75% HEP compliant to improve carryover between sessions and facilitate independent management of condition    Baseline 12/2: MET    Status Achieved    Target Date 10/21/21               PT Long Term Goals - 11/10/21 1039       PT LONG TERM GOAL #1   Title Target date for all long term goals:  11/28/20      PT LONG TERM GOAL #2   Title Courtney Barry will improve FOTO score from 59 (on evaluation) to 73 as a proxy for functional improvement    Baseline  11/10/21 improved to 65    Period Weeks    Status On-going      PT LONG TERM GOAL #3   Title Courtney Barry will improve 10 meter max gait speed to .7 m/s (.1 m/s MCID) to show functional improvement in ambulation    EVAL: .53 m/s    Status On-going      PT LONG TERM GOAL #4   Title Courtney Barry will improve the following MMTs to >/= 4/5 to show improvement in strength:  L knee    EVAL: 3/5 limited by pain; 11/10/21 Knee ext 5/5, knee flex 4/5    Status Achieved      PT LONG TERM GOAL #5   Title Courtney Barry will be able to navigate 10 steps using reciprocal pattern, not limited by pain, to enable community ambulation    EVAL: limited by pain; 11/10/21- pt reports no difficulty with task    Status Achieved      PT LONG TERM GOAL #6   Title Courtney Barry will report >/= 50% decrease in pain from evaluation    EVAL: 6/10 max pain; 11/10/21 pt reports overall improvement to 50% or more    Status Achieved      PT LONG TERM GOAL #7   Title Courtney Barry will be able to return to golfing, not limited by pain    EVAL: limited; 11/10/21- plans to try golf Friday- has been putting downt the hall without difficulty.    Status On-going                   Plan - 11/18/21 1152     Clinical Impression  Statement Pt reports she was able to climb stairs to her bonus room without fear and without increased pain. Today we concentrated on knee strengthening and hip strengthening. She had increased medial left knee pain with step up/ heel raise combo so completed exercise without heel raise with decreased pain. She has one more visit in [place].    PT Treatment/Interventions ADLs/Self Care Home Management;Aquatic Therapy;Electrical Stimulation;Iontophoresis 86m/ml Dexamethasone;Gait training;Therapeutic activities;Therapeutic exercise;Neuromuscular re-education;Manual techniques;Dry needling;Vasopneumatic Device    PT Next Visit Plan check remaining LTGs, closed chain    PT Home Exercise Plan [phone]            Patient will benefit from skilled therapeutic intervention in order to improve the following deficits and impairments:  Abnormal gait, Difficulty walking, Decreased mobility, Decreased strength, Pain  Visit Diagnosis: Left knee pain, unspecified chronicity  Other abnormalities of gait and mobility     Problem List Patient Active Problem List   Diagnosis Date Noted   Vaccine counseling 05/21/2021   PVD (peripheral vascular disease) (HC[place] 05/21/2021   Abnormal ankle brachial index ([name]) 05/21/2021   Situational anxiety 11/21/2019   Hyperlipidemia associated with type 2 diabetes mellitus (HRanch[place] 11/21/2019   Diabetes mellitus without complication (HT[place] 038/45/3646  Uncontrolled type 2 diabetes mellitus with hyperglycemia (HR[place] 09/24/2019   Essential hypertension, benign 09/24/2019   Hypothyroidism 09/24/2019   Mixed hyperlipidemia 09/24/2019   Menopausal vaginal dryness    Cyst of vulva 09/11/2015   Epidermoid cyst of skin 09/11/2015   Melanocytic nevus 09/11/2015   Dysplastic nevus of skin 09/11/2015   Normal gynecologic examination 09/05/2014   Encounter for specialized medical examination 09/04/2013    DDorene Barry PTA 11/18/2021, 1:34 PM  [facility] [place] [address]Phone: [phone]  Fax:  [phone] Name: DHadleigh FelberMRN:  [id number] Date of Birth: [birth year]

## 2021-11-20 ENCOUNTER — Ambulatory Visit (INDEPENDENT_AMBULATORY_CARE_PROVIDER_SITE_OTHER): Payer: Medicare PPO

## 2021-11-20 VITALS — Ht 64.0 in | Wt 172.0 lb

## 2021-11-20 DIAGNOSIS — Z0000 Encounter for general adult medical examination without abnormal findings: Secondary | ICD-10-CM

## 2021-11-20 NOTE — Patient Instructions (Signed)
Courtney Barry , Thank you for taking time to come for your Medicare Wellness Visit. I appreciate your ongoing commitment to your health goals. Please review the following plan we discussed and let me know if I can assist you in the future.   Screening recommendations/referrals: Colonoscopy: completed 05/04/2013 Mammogram: completed 01/19/2021 Bone Density: completed 10/22/2020 Recommended yearly ophthalmology/optometry visit for glaucoma screening and checkup Recommended yearly dental visit for hygiene and checkup  Vaccinations: Influenza vaccine: completed 08/14/2021 Pneumococcal vaccine: completed 11/24/2020 Tdap vaccine: due Shingles vaccine: discussed   Covid-19: 05/28/2021, 08/25/2020, 01/02/2020, 12/12/2019  Advanced directives: copy in chart  Conditions/risks identified: none  Next appointment: Follow up in one year for your annual wellness visit    Preventive Care 69 Years and Older, Female Preventive care refers to lifestyle choices and visits with your health care provider that can promote health and wellness. What does preventive care include? A yearly physical exam. This is also called an annual well check. Dental exams once or twice a year. Routine eye exams. Ask your health care provider how often you should have your eyes checked. Personal lifestyle choices, including: Daily care of your teeth and gums. Regular physical activity. Eating a healthy diet. Avoiding tobacco and drug use. Limiting alcohol use. Practicing safe sex. Taking low-dose aspirin every day. Taking vitamin and mineral supplements as recommended by your health care provider. What happens during an annual well check? The services and screenings done by your health care provider during your annual well check will depend on your age, overall health, lifestyle risk factors, and family history of disease. Counseling  Your health care provider may ask you questions about your: Alcohol use. Tobacco use. Drug  use. Emotional well-being. Home and relationship well-being. Sexual activity. Eating habits. History of falls. Memory and ability to understand (cognition). Work and work Statistician. Reproductive health. Screening  You may have the following tests or measurements: Height, weight, and BMI. Blood pressure. Lipid and cholesterol levels. These may be checked every 5 years, or more frequently if you are over 37 years old. Skin check. Lung cancer screening. You may have this screening every year starting at age 10 if you have a 30-pack-year history of smoking and currently smoke or have quit within the past 15 years. Fecal occult blood test (FOBT) of the stool. You may have this test every year starting at age 87. Flexible sigmoidoscopy or colonoscopy. You may have a sigmoidoscopy every 5 years or a colonoscopy every 10 years starting at age 76. Hepatitis C blood test. Hepatitis B blood test. Sexually transmitted disease (STD) testing. Diabetes screening. This is done by checking your blood sugar (glucose) after you have not eaten for a while (fasting). You may have this done every 1-3 years. Bone density scan. This is done to screen for osteoporosis. You may have this done starting at age 45. Mammogram. This may be done every 1-2 years. Talk to your health care provider about how often you should have regular mammograms. Talk with your health care provider about your test results, treatment options, and if necessary, the need for more tests. Vaccines  Your health care provider may recommend certain vaccines, such as: Influenza vaccine. This is recommended every year. Tetanus, diphtheria, and acellular pertussis (Tdap, Td) vaccine. You may need a Td booster every 10 years. Zoster vaccine. You may need this after age 1. Pneumococcal 13-valent conjugate (PCV13) vaccine. One dose is recommended after age 75. Pneumococcal polysaccharide (PPSV23) vaccine. One dose is recommended after age  99.  Talk to your health care provider about which screenings and vaccines you need and how often you need them. This information is not intended to replace advice given to you by your health care provider. Make sure you discuss any questions you have with your health care provider. Document Released: 11/28/2015 Document Revised: 07/21/2016 Document Reviewed: 09/02/2015 Elsevier Interactive Patient Education  2017 [place] Prevention in the Home Falls can cause injuries. They can happen to people of all ages. There are many things you can do to make your home safe and to help prevent falls. What can I do on the outside of my home? Regularly fix the edges of walkways and driveways and fix any cracks. Remove anything that might make you trip as you walk through a door, such as a raised step or threshold. Trim any bushes or trees on the path to your home. Use bright outdoor lighting. Clear any walking paths of anything that might make someone trip, such as rocks or tools. Regularly check to see if handrails are loose or broken. Make sure that both sides of any steps have handrails. Any raised decks and porches should have guardrails on the edges. Have any leaves, snow, or ice cleared regularly. Use sand or salt on walking paths during winter. Clean up any spills in your garage right away. This includes oil or grease spills. What can I do in the bathroom? Use night lights. Install grab bars by the toilet and in the tub and shower. Do not use towel bars as grab bars. Use non-skid mats or decals in the tub or shower. If you need to sit down in the shower, use a plastic, non-slip stool. Keep the floor dry. Clean up any water that spills on the floor as soon as it happens. Remove soap buildup in the tub or shower regularly. Attach bath mats securely with double-sided non-slip rug tape. Do not have throw rugs and other things on the floor that can make you trip. What can I do in the  bedroom? Use night lights. Make sure that you have a light by your bed that is easy to reach. Do not use any sheets or blankets that are too big for your bed. They should not hang down onto the floor. Have a firm chair that has side arms. You can use this for support while you get dressed. Do not have throw rugs and other things on the floor that can make you trip. What can I do in the kitchen? Clean up any spills right away. Avoid walking on wet floors. Keep items that you use a lot in easy-to-reach places. If you need to reach something above you, use a strong step stool that has a grab bar. Keep electrical cords out of the way. Do not use floor polish or wax that makes floors slippery. If you must use wax, use non-skid floor wax. Do not have throw rugs and other things on the floor that can make you trip. What can I do with my stairs? Do not leave any items on the stairs. Make sure that there are handrails on both sides of the stairs and use them. Fix handrails that are broken or loose. Make sure that handrails are as long as the stairways. Check any carpeting to make sure that it is firmly attached to the stairs. Fix any carpet that is loose or worn. Avoid having throw rugs at the top or bottom of the stairs. If you do have throw rugs,  attach them to the floor with carpet tape. Make sure that you have a light switch at the top of the stairs and the bottom of the stairs. If you do not have them, ask someone to add them for you. What else can I do to help prevent falls? Wear shoes that: Do not have high heels. Have rubber bottoms. Are comfortable and fit you well. Are closed at the toe. Do not wear sandals. If you use a stepladder: Make sure that it is fully opened. Do not climb a closed stepladder. Make sure that both sides of the stepladder are locked into place. Ask someone to hold it for you, if possible. Clearly mark and make sure that you can see: Any grab bars or  handrails. First and last steps. Where the edge of each step is. Use tools that help you move around (mobility aids) if they are needed. These include: Canes. Walkers. Scooters. Crutches. Turn on the lights when you go into a dark area. Replace any light bulbs as soon as they burn out. Set up your furniture so you have a clear path. Avoid moving your furniture around. If any of your floors are uneven, fix them. If there are any pets around you, be aware of where they are. Review your medicines with your doctor. Some medicines can make you feel dizzy. This can increase your chance of falling. Ask your doctor what other things that you can do to help prevent falls. This information is not intended to replace advice given to you by your health care provider. Make sure you discuss any questions you have with your health care provider. Document Released: 08/28/2009 Document Revised: 04/08/2016 Document Reviewed: 12/06/2014 Elsevier Interactive Patient Education  2017 [employer].

## 2021-11-20 NOTE — Progress Notes (Signed)
I connected with  Courtney Barry today via telehealth video enabled device and verified that I am speaking with the correct person using two identifiers.   Location: Patient: home Provider: work  Persons participating in virtual visit: Akaila Kahler, [name] LPN  I discussed the limitations, risks, security and privacy concerns of performing an evaluation and management service by video and the availability of in person appointments. The patient expressed understanding and agreed to proceed.   Some vital signs may be absent or patient reported.     Subjective:   Courtney Barry is a 71 y.o. female who presents for Medicare Annual (Subsequent) preventive examination.  Review of Systems     Cardiac Risk Factors include: advanced age (>73mn, >>9women);diabetes mellitus;dyslipidemia;hypertension     Objective:    Today's Vitals   11/20/21 1029  Weight: 172 lb (78 kg)  Height: _0  (1.626 m)   Body mass index is 29.52 kg/m.  Advanced Directives 11/20/2021 09/30/2021  Does Patient Have a Medical Advance Directive? Yes No  Type of Advance Directive Out of facility DNR (pink MOST or yellow form) -  Would patient like information on creating a medical advance directive? - No - Patient declined    Current Medications (verified) Outpatient Encounter Medications as of 11/20/2021  Medication Sig   acetaminophen (TYLENOL) 500 MG tablet Take 500 mg by mouth at bedtime as needed.   atorvastatin (LIPITOR) 40 MG tablet TAKE 1 TABLET BY MOUTH EVERY DAY   Blood Glucose Monitoring Suppl (ACCU-CHEK GUIDE ME) w/Device KIT Use as directed to check blood glucose twice daily before breakfast and bed. DX E11.65   Continuous Blood Gluc Receiver (FREESTYLE LIBRE 2 READER) DEVI Use as instructed qid. Dx E11.65   Continuous Blood Gluc Sensor (FREESTYLE LIBRE 2 SENSOR) MISC 1 each by Does not apply route every 14 (fourteen) days.   estradiol (ESTRACE) 0.1 MG/GM vaginal cream Place 1  Applicatorful vaginally 2 (two) times a week.   loratadine-pseudoephedrine (CLARITIN-D 12-HOUR) 5-120 MG tablet Take 1 tablet by mouth daily as needed.   losartan (COZAAR) 25 MG tablet Take 1 tablet (25 mg total) by mouth daily.   metFORMIN (GLUCOPHAGE-XR) 500 MG 24 hr tablet Take 1 tablet (500 mg total) by mouth every evening. Take one tablet each evening with dinner   SYNTHROID 112 MCG tablet TAKE 1 TABLET BY MOUTH EVERY DAY   TRESIBA FLEXTOUCH 100 UNIT/ML FlexTouch Pen Inject 25 Units into the skin at bedtime.   TRULICITY 1.5 MJM/4.2ASSOPN INJECT 1.5 MG INTO THE SKIN ONCE A WEEK.   No facility-administered encounter medications on file as of 11/20/2021.    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 [place]    Hyperlipidemia    Hypothyroidism    Menopausal vaginal dryness    Past Surgical History:  Procedure Laterality Date   ABDOMINAL HYSTERECTOMY     APPENDECTOMY     CHOLECYSTECTOMY     TONSILLECTOMY     Family History  Problem Relation Age of Onset   Thyroid disease Mother    Cancer Mother    Pancreatic cancer Mother    Hypertension Father    Kidney disease Father    Thyroid cancer Sister        died at 512  Social History   Socioeconomic History   Marital status: Married    Spouse name: Not on file   Number of children:  Not on file   Years of education: Not on file   Highest education level: Not on file  Occupational History   Not on file  Tobacco Use   Smoking status: Never   Smokeless tobacco: Never  Vaping Use   Vaping Use: Never used  Substance and Sexual Activity   Alcohol use: Never   Drug use: Never   Sexual activity: Yes    Partners: Male    Comment: 1st intercourse- 27, partners- 71, married- 70 yrs,  Other Topics Concern   Not on file  Social History Narrative   Not on file   Social Determinants of Health   Financial Resource Strain: Low Risk    Difficulty  of Paying Living Expenses: Not hard at all  Food Insecurity: No Food Insecurity   Worried About Charity fundraiser in the Last Year: Never true   Arboriculturist in the Last Year: Never true  Transportation Needs: No Transportation Needs   Lack of Transportation (Medical): No   Lack of Transportation (Non-Medical): No  Physical Activity: Insufficiently Active   Days of Exercise per Week: 1 day   Minutes of Exercise per Session: 40 min  Stress: No Stress Concern Present   Feeling of Stress : Not at all  Social Connections: Not on file    Tobacco Counseling Counseling given: Not Answered   Clinical Intake:  Pre-visit preparation completed: Yes  Pain : No/denies pain     Nutritional Status: BMI 25 -29 Overweight Nutritional Risks: None Diabetes: Yes  How often do you need to have someone help you when you read instructions, pamphlets, or other written materials from your doctor or pharmacy?: 1 - Never What is the last grade level you completed in school?: masters degree  Diabetic? Yes Nutrition Risk Assessment:  Has the patient had any N/V/D within the last 2 months?  No  Does the patient have any non-healing wounds?  No  Has the patient had any unintentional weight loss or weight gain?  No   Diabetes:  Is the patient diabetic?  Yes  If diabetic, was a CBG obtained today?  No  Did the patient bring in their glucometer from home?  No  How often do you monitor your CBG's? frequently.   Financial Strains and Diabetes Management:  Are you having any financial strains with the device, your supplies or your medication? No .  Does the patient want to be seen by Chronic Care Management for management of their diabetes?  No  Would the patient like to be referred to a Nutritionist or for Diabetic Management?  No   Diabetic Exams:  Diabetic Eye Exam: Completed 12/31/2020 Diabetic Foot Exam: Completed 05/21/2021   Interpreter Needed?: No  Information entered by :: NAllen  LPN   Activities of Daily Living In your present state of health, do you have any difficulty performing the following activities: 11/20/2021 11/24/2020  Hearing? N N  Vision? N N  Difficulty concentrating or making decisions? N N  Walking or climbing stairs? N Y  Dressing or bathing? N N  Doing errands, shopping? N N  Preparing Food and eating ? N N  Using the Toilet? N N  In the past six months, have you accidently leaked urine? N N  Do you have problems with loss of bowel control? N N  Managing your Medications? N N  Managing your Finances? N N  Housekeeping or managing your Housekeeping? N N  Some recent data might be  hidden    Patient Care Team: Marcellina Millin as PCP - General (Physician Assistant) Brita Romp, NP as Nurse Practitioner (Nurse Practitioner)  Indicate any recent Medical Services you may have received from other than [facility] providers in the past year (date may be approximate).     Assessment:   This is a routine wellness examination for Carri.  Hearing/Vision screen Vision Screening - Comments:: Regular eye exams, [facility]  Dietary issues and exercise activities discussed: Current Exercise Habits: Home exercise routine, Time (Minutes): 45, Frequency (Times/Week): 1, Weekly Exercise (Minutes/Week): 45, Exercise limited by: Other - see comments (physical therapy)   Goals Addressed             This Visit's Progress    Patient Stated       11/20/2021, keep moving and stay active with golfing and walking       Depression Screen PHQ 2/9 Scores 11/20/2021 08/14/2021 05/21/2021 03/03/2021 11/24/2020 05/21/2020 11/21/2019  PHQ - 2 Score 0 0 0 0 0 0 1    Fall Risk Fall Risk  11/20/2021 06/29/2021 05/21/2021 03/03/2021 11/24/2020  Falls in the past year? 0 0 1 0 1  Number falls in past yr: - - 0 0 1  Injury with Fall? - - 0 0 0  Risk for fall due to : Medication side effect - No Fall Risks No Fall Risks -  Follow up Falls evaluation completed;Education  provided;Falls prevention discussed Falls evaluation completed Falls evaluation completed Falls evaluation completed -    FALL RISK PREVENTION PERTAINING TO THE HOME:  Any stairs in or around the home? Yes  If so, are there any without handrails? No  Home free of loose throw rugs in walkways, pet beds, electrical cords, etc? Yes  Adequate lighting in your home to reduce risk of falls? Yes   ASSISTIVE DEVICES UTILIZED TO PREVENT FALLS:  Life alert? No  Use of a cane, walker or w/c? No  Grab bars in the bathroom? No  Shower chair or bench in shower? No  Elevated toilet seat or a handicapped toilet? Yes   TIMED UP AND GO:  Was the test performed? No .      Cognitive Function:     6CIT Screen 11/20/2021  What Year? 0 points  What month? 0 points  What time? 0 points  Count back from 20 0 points  Months in reverse 0 points  Repeat phrase 0 points  Total Score 0    Immunizations Immunization History  Administered Date(s) Administered   Fluad Quad(high Dose 65+) 09/10/2020, 08/14/2021   Influenza-Unspecified 08/09/2019   PFIZER Comirnaty(Gray Top)Covid-19 Tri-Sucrose Vaccine 05/28/2021   PFIZER(Purple Top)SARS-COV-2 Vaccination 12/31/2019, 01/02/2020, 08/25/2020   Pneumococcal Conjugate-13 11/24/2020    TDAP status: Due, Education has been provided regarding the importance of this vaccine. Advised may receive this vaccine at local pharmacy or Health Dept. Aware to provide a copy of the vaccination record if obtained from local pharmacy or Health Dept. Verbalized acceptance and understanding.  Flu Vaccine status: Up to date  Pneumococcal vaccine status: Due, Education has been provided regarding the importance of this vaccine. Advised may receive this vaccine at local pharmacy or Health Dept. Aware to provide a copy of the vaccination record if obtained from local pharmacy or Health Dept. Verbalized acceptance and understanding.  Covid-19 vaccine status: Completed  vaccines  Qualifies for Shingles Vaccine? Yes   Zostavax completed No   Shingrix Completed?: No.    Education has been provided  regarding the importance of this vaccine. Patient has been advised to call insurance company to determine out of pocket expense if they have not yet received this vaccine. Advised may also receive vaccine at local pharmacy or Health Dept. Verbalized acceptance and understanding.  Screening Tests Health Maintenance  Topic Date Due   TETANUS/TDAP  Never done   Zoster Vaccines- Shingrix (1 of 2) Never done   COVID-19 Vaccine (5 - Booster) 07/23/2021   Pneumonia Vaccine 12+ Years old (2 - PPSV23 if available, else PCV20) 11/24/2021   HEMOGLOBIN A1C  12/30/2021   OPHTHALMOLOGY EXAM  12/31/2021   FOOT EXAM  05/21/2022   MAMMOGRAM  01/20/2023   COLONOSCOPY (Pts 45-22yr Insurance coverage will need to be confirmed)  05/05/2023   INFLUENZA VACCINE  Completed   DEXA SCAN  Completed   Hepatitis C Screening  Completed   HPV VACCINES  Aged Out    Health Maintenance  Health Maintenance Due  Topic Date Due   TETANUS/TDAP  Never done   Zoster Vaccines- Shingrix (1 of 2) Never done   COVID-19 Vaccine (5 - Booster) 07/23/2021   Pneumonia Vaccine 69 Years old (2 - PPSV23 if available, else PCV20) 11/24/2021    Colorectal cancer screening: Type of screening: Colonoscopy. Completed 05/04/2013. Repeat every 10 years  Mammogram status: Completed 01/19/2021. Repeat every year  Bone Density status: Completed 10/22/2020.   Lung Cancer Screening: (Low Dose CT Chest recommended if Age 69-80years, 30 pack-year currently smoking OR have quit w/in 15years.) does not qualify.   Lung Cancer Screening Referral: no  Additional Screening:  Hepatitis C Screening: does qualify; Completed 11/24/2020  Vision Screening: Recommended annual ophthalmology exams for early detection of glaucoma and other disorders of the eye. Is the patient up to date with their annual eye exam?  Yes  Who  is the provider or what is the name of the [facility] the [facility] If pt is not established with a provider, would they like to be referred to a provider to establish care? No .   Dental Screening: Recommended annual dental exams for proper oral hygiene  Community Resource Referral / Chronic Care Management: CRR required this visit?  No   CCM required this visit?  No      Plan:     I have personally reviewed and noted the following in the patients chart:   Medical and social history Use of alcohol, tobacco or illicit drugs  Current medications and supplements including opioid prescriptions.  Functional ability and status Nutritional status Physical activity Advanced directives List of other physicians Hospitalizations, surgeries, and ER visits in previous 12 months Vitals Screenings to include cognitive, depression, and falls Referrals and appointments  In addition, I have reviewed and discussed with patient certain preventive protocols, quality metrics, and best practice recommendations. A written personalized care plan for preventive services as well as general preventive health recommendations were provided to patient.     NKellie Simmering LPN   08/21/8674  Nurse Notes: none

## 2021-11-24 ENCOUNTER — Other Ambulatory Visit: Payer: Self-pay | Admitting: Obstetrics & Gynecology

## 2021-11-24 ENCOUNTER — Ambulatory Visit: Payer: Medicare PPO | Admitting: Physical Therapy

## 2021-11-24 ENCOUNTER — Encounter: Payer: Self-pay | Admitting: Physical Therapy

## 2021-11-24 ENCOUNTER — Other Ambulatory Visit: Payer: Self-pay | Admitting: Nurse Practitioner

## 2021-11-24 ENCOUNTER — Other Ambulatory Visit: Payer: Self-pay

## 2021-11-24 DIAGNOSIS — M25562 Pain in left knee: Secondary | ICD-10-CM

## 2021-11-24 DIAGNOSIS — R2689 Other abnormalities of gait and mobility: Secondary | ICD-10-CM

## 2021-11-24 DIAGNOSIS — M6281 Muscle weakness (generalized): Secondary | ICD-10-CM | POA: Diagnosis not present

## 2021-11-24 NOTE — Therapy (Addendum)
[place], [place], [address] Phone: [phone]   Fax:  [phone]  PHYSICAL THERAPY DISCHARGE SUMMARY  Visits from Start of Care: 13  Current functional level related to goals / functional outcomes: See assessment/goals   Remaining deficits: See assessment/goals   Education / Equipment: HEP and D/C plans  Patient agrees to discharge. Patient goals were met. Patient is being discharged due to being pleased with the current functional level.   Patient Details  Name: Courtney Barry MRN: [id number] Date of Birth: 21-Aug-1951 Referring Provider (PT): Denita Lung, MD   Encounter Date: 11/24/2021   PT End of Session - 11/24/21 1106     Visit Number 13    Number of Visits 16    Date for PT Re-Evaluation 11/28/20    Authorization Type [employer] M[employer]    Authorization Time Period 10/02/21-12/05/20-16 visits    Authorization - Visit Number 11    Authorization - Number of Visits 16    Progress Note Due on Visit 20    PT Start Time 1100    PT Stop Time 0092    PT Time Calculation (min) 45 min             Past Medical History:  Diagnosis Date   Diabetes mellitus, type II ([place])    Hyperlipidemia    Hypothyroidism    Menopausal vaginal dryness     Past Surgical History:  Procedure Laterality Date   ABDOMINAL HYSTERECTOMY     APPENDECTOMY     CHOLECYSTECTOMY     TONSILLECTOMY      There were no vitals filed for this visit.   Subjective Assessment - 11/24/21 1105     Subjective My stiffness and soreness is rated at a 4.5/10 today. It is feeling pretty good. No shooting pain. Just a little soreness.    Currently in Pain? Yes    Pain Score 4     Pain Location Knee    Pain Orientation Left    Pain Descriptors / Indicators Sore    Pain Type Chronic pain    Aggravating Factors  stanging, stepping off the curb    Pain Relieving Factors hot shower, tylenol             [facility] Adult PT  Treatment/Exercise:   Therapeutic Exercise: - Nustep L4 x 9min no UE - 6 inch step up 1x10 each with march  - seated Leg press - 35# 3x10 - knee ext machine bil - 5# - 3x10 - knee flexion machine - 25# - 3x10     Neuromuscular re-ed, to improve balance and reduce fall risk: - Tandem stance 45'' bouts - on foam  - SLS - on airex  12 sec L, 9 sec R       PT Short Term Goals - 10/16/21 1033       PT SHORT TERM GOAL #1   Title Myriam will be >75% HEP compliant to improve carryover between sessions and facilitate independent management of condition    Baseline 12/2: MET    Status Achieved    Target Date 10/21/21               PT Long Term Goals - 11/24/21 1252       PT LONG TERM GOAL #1   Title Target date for all long term goals:  11/28/20      PT LONG TERM GOAL #2   Title Jailee will improve FOTO score from  59 (on evaluation) to 73 as a proxy for functional improvement    Baseline 11/10/21 improved to 65; 11/24/21: 67%    Period Weeks    Status Achieved      PT LONG TERM GOAL #3   Title Teauna will improve 10 meter max gait speed to .7 m/s (.1 m/s MCID) to show functional improvement in ambulation    EVAL: .53 m/s; 11/24/21: .46ms    Period Weeks    Status Achieved      PT LONG TERM GOAL #4   Title Dakia will improve the following MMTs to >/= 4/5 to show improvement in strength:  L knee    EVAL: 3/5 limited by pain; 11/10/21 Knee ext 5/5, knee flex 4/5    Status Achieved      PT LONG TERM GOAL #5   Title Paislyn will be able to navigate 10 steps using reciprocal pattern, not limited by pain, to enable community ambulation    EVAL: limited by pain; 11/10/21- pt reports no difficulty with task    Status Achieved      PT LONG TERM GOAL #6   Title Shernita will report >/= 50% decrease in pain from evaluation    EVAL: 6/10 max pain; 11/10/21 pt reports overall improvement to 50% or more    Status Achieved      PT LONG TERM GOAL #7   Title Aimar will be able to return to  golfing, not limited by pain    EVAL: limited; 11/10/21- plans to try golf Friday- has been putting downt the hall without difficulty 11/24/21: has not attempted    Period Weeks    Status Unable to assess                   Plan - 11/24/21 1254     Clinical Impression Statement Pt reports she was able to climb bleachers with hand rail to attend a basketball game over the weekend. She did not try golfing. She no longer has shootingpain in her knee, just soreness that is rated at lowest 4.5/10 on the pain scale. Her MMT and FOTO score improved as well as gait speed. She plans to attend [employer] starting Friday with her husband. This session was spent with review of HEP and gym machines. She has met all assessable goals and is agreeable to HEP.    PT Treatment/Interventions ADLs/Self Care Home Management;Aquatic Therapy;Electrical Stimulation;Iontophoresis 468mml Dexamethasone;Gait training;Therapeutic activities;Therapeutic exercise;Neuromuscular re-education;Manual techniques;Dry needling;Vasopneumatic Device    PT Next Visit Plan discharge to HEP today    PT Home Exercise Plan [id number]           Patient will benefit from skilled therapeutic intervention in order to improve the following deficits and impairments:  Abnormal gait, Difficulty walking, Decreased mobility, Decreased strength, Pain  Visit Diagnosis: Left knee pain, unspecified chronicity  Other abnormalities of gait and mobility  Muscle weakness     Problem List Patient Active Problem List   Diagnosis Date Noted   Vaccine counseling 05/21/2021   PVD (peripheral vascular disease) ([facility]07/05/2021   Abnormal ankle brachial index ([name]) 05/21/2021   Situational anxiety 11/21/2019   Hyperlipidemia associated with type 2 diabetes mellitus ([facility]01/04/2020   Diabetes mellitus without complication ([facility]0183/15/1761 Uncontrolled type 2 diabetes mellitus with hyperglycemia ([facility]11/07/2019   Essential hypertension,  benign 09/24/2019   Hypothyroidism 09/24/2019   Mixed hyperlipidemia 09/24/2019   Menopausal vaginal dryness    Cyst of vulva 09/11/2015  Epidermoid cyst of skin 09/11/2015   Melanocytic nevus 09/11/2015   Dysplastic nevus of skin 09/11/2015   Normal gynecologic examination 09/05/2014   Encounter for specialized medical examination 09/04/2013    Dorene Ar, PTA 11/24/2021, 12:57 PM  [place], [place], [address] Phone: [phone]   Fax:  [phone]  Name: Courtney Barry MRN: [id number] Date of Birth: 1951/08/16

## 2021-11-24 NOTE — Telephone Encounter (Signed)
Called patient she reports she is still using cream, but not twice weekly. She only uses about once a week if that. She did not need a refill so Rx was denied. Patient said she will call the office is she needs refill.

## 2021-11-30 DIAGNOSIS — E1165 Type 2 diabetes mellitus with hyperglycemia: Secondary | ICD-10-CM | POA: Diagnosis not present

## 2021-12-01 ENCOUNTER — Other Ambulatory Visit: Payer: Self-pay

## 2021-12-01 DIAGNOSIS — E1165 Type 2 diabetes mellitus with hyperglycemia: Secondary | ICD-10-CM

## 2021-12-15 ENCOUNTER — Encounter (INDEPENDENT_AMBULATORY_CARE_PROVIDER_SITE_OTHER): Payer: Self-pay | Admitting: Medical

## 2021-12-16 ENCOUNTER — Ambulatory Visit: Payer: Managed Care, Other (non HMO) | Attending: Medical | Admitting: Medical

## 2021-12-16 ENCOUNTER — Other Ambulatory Visit: Payer: Self-pay

## 2021-12-16 ENCOUNTER — Encounter (INDEPENDENT_AMBULATORY_CARE_PROVIDER_SITE_OTHER): Payer: Self-pay | Admitting: Medical

## 2021-12-16 VITALS — BP 120/70 | Ht 63.0 in | Wt 138.0 lb

## 2021-12-16 DIAGNOSIS — Z01419 Encounter for gynecological examination (general) (routine) without abnormal findings: Secondary | ICD-10-CM

## 2021-12-16 DIAGNOSIS — Z1231 Encounter for screening mammogram for malignant neoplasm of breast: Secondary | ICD-10-CM

## 2021-12-16 NOTE — Addendum Note (Signed)
Addended by: Renea Ee on: 12/16/2021 11:36 AM     Modules accepted: Orders

## 2021-12-16 NOTE — Progress Notes (Signed)
OB/GYN, [address]  [place] [id number]  Operated by [facility]  Progress Note    Name: Laquisha Northcraft MRN:  [id number]   Date: 12/16/2021 Age: 71 y.o.        ANNUAL VISIT    PATIENT: Kristin Kennedy  CHART NUMBER: [id number]  DATE OF SERVICE: 12/16/2021    CC: "Annual exam"  Chief Complaint   Patient presents with   . Annual Exam     Patient is a 71 y.o. who presents today for a yearly check-up. Her last mammogram was 02-02-2021 and WNL at [facility]. She is not having any problems.        HPI: Leeyah Christal Lagerstrom is a 71 y.o. year old No obstetric history on file..  She presents to clinic for her annual exam. She is doing well.  No LMP recorded. Patient has had a hysterectomy.          REVIEW OF SYSTEM  Pertinent review of system as per HPI.    OB History:  OB History   Gravida Para Term Preterm AB Living   1 0 0   1     SAB IAB Ectopic Multiple Live Births   1              # Outcome Date GA Lbr Len/2nd Weight Sex Delivery Anes PTL Lv   1 SAB                PAST MEDICAL HISTORY:  Past Medical History:   Diagnosis Date   . BCC (basal cell carcinoma), eyelid    . Cervical dysplasia    . Hypothyroidism    . Miscarriage            PAST SURGICAL HISTORY:  Past Surgical History:   Procedure Laterality Date   . BASAL CELL CARCINOMA EXCISION      EYELID   . HX BREAST BIOPSY     . HX COLPOSCOPY     . HX CRYOSURGERY OF CERVIX     . HX HYSTERECTOMY      TVH WO BSO   . OTHER SURGICAL HISTORY      FX OF FEMUR           FAMILY HISTORY:  Family Medical History:     Problem Relation (Age of Onset)    Breast Cancer Paternal Grandfather, Paternal Aunt    Diabetes Paternal Grandfather    High Cholesterol Father            SOCIAL HISTORY:  Social History     Socioeconomic History   . Marital status: Married   Tobacco Use   . Smoking status: Former     Types: Cigarettes   . Smokeless tobacco: Never   Substance and Sexual Activity   . Alcohol use: Not Currently   . Drug use: Never   . Sexual  activity: Yes     Partners: Male     Birth control/protection: Female Sterilization     Comment: hyst        CURRENT MEDICATIONS:   Current Outpatient Medications   Medication Sig   . Ascorbic Acid 100 mg Oral Tablet, Chewable Chew 5 Tablets (500 mg total)   . calcium carbonate 500 mg/5 mL (1,250 mg/5 mL) Oral Suspension Take by mouth   . cholecalciferol, Vitamin D3, 125 mcg (5,000 unit) Oral Tablet Take 1 Tablet (5,000 Units total) by mouth   . ferrous sulfate (FERATAB) 324 mg (  65 mg iron) Oral Tablet, Delayed Release (E.C.) Take 1 Tablet (324 mg total) by mouth   . levothyroxine (SYNTHROID) 75 mcg Oral Tablet Synthroid 75 mcg tablet   TAKE 1 TABLET BY MOUTH ONCE DAILY IN THE MORNING BEFORE BREAKFAST   . omega 3-dha-epa-fish oil 1,000 mg (250 mg-750 mg)/5 mL Oral Liquid Take 1,000 mg by mouth       ALLERGIES:  Patient has no known allergies.     PHYSICAL EXAMINATION:   Vitals:    12/16/21 1058   BP: 120/70   Weight: 62.6 kg (138 lb)   Height: 1.6 m (5\' 3" )   BMI: 24.5         Body mass index is 24.45 kg/m.   Physical Exam  Constitutional:       General: She is awake.      Appearance: Normal appearance.   Genitourinary:      No lesions in the vagina.      Right Labia: No rash, tenderness, lesions, skin changes or Bartholin's cyst.     Left Labia: No tenderness, lesions, skin changes, Bartholin's cyst or rash.     No vaginal discharge, erythema, tenderness, bleeding, ulceration or granulation tissue.      No vaginal prolapse present.     No vaginal atrophy present.       Right Adnexa: not tender, not full and no mass present.     Left Adnexa: not tender, not full and no mass present.     No cervical discharge, friability, lesion or polyp.      Uterus is not enlarged or tender.   Breasts:     Breasts are symmetrical.      Right: No swelling, mass, nipple discharge, skin change or tenderness.      Left: No swelling, mass, nipple discharge, skin change or tenderness.   Neck:      Thyroid: No thyroid mass, thyromegaly  or thyroid tenderness.   Cardiovascular:      Rate and Rhythm: Normal rate and regular rhythm.      Heart sounds: Normal heart sounds.   Pulmonary:      Effort: No tachypnea, bradypnea, accessory muscle usage or prolonged expiration.      Breath sounds: Normal breath sounds.   Abdominal:      General: There is no distension.      Palpations: Abdomen is soft. There is no hepatomegaly or mass.   Musculoskeletal:      Cervical back: Normal range of motion.   Lymphadenopathy:      Cervical:      Right cervical: No superficial, deep or posterior cervical adenopathy.     Left cervical: No superficial, deep or posterior cervical adenopathy.   Neurological:      Mental Status: She is alert.          ASSESSMENT:  (Z01.419) Encounter for well woman exam with routine gynecological exam  (primary encounter diagnosis)       PLAN:  Full annual check up completed.    Denies bone fractures within the past 6 months.    Following with the breast center for dense breasts.  MRI negative 9/22.  Has appt with them in March.  Had colonoscopy 2020.  Due again 2030.    Endocrinology following DXA scan.  Last done 4/21.  Normal pap 2015 and 2017 and 2019.  Cotesting done per patient request and negative 2022.  No longer needs paps.  S/P hysterectomy for benign reasons.    Patient  noticed increased hair loss one year ago.  Discussed with endocrinology who increased thyroid medication despite TSH<1.  Has started Iron- h/o thalessemia. Is taking Biotin and using special shampoo and has noticed improvement with these changes.  After researching questions if she needs the 98mcg of Synthroid or needs it at all.  Recommended she not discontinue as she will likely feel worse.  Advised to discuss dose with endocrinology.    RTO in 1 year or prn.      No orders of the defined types were placed in this encounter.         Renea Ee, PA-C

## 2021-12-21 DIAGNOSIS — E1165 Type 2 diabetes mellitus with hyperglycemia: Secondary | ICD-10-CM | POA: Diagnosis not present

## 2021-12-21 DIAGNOSIS — E039 Hypothyroidism, unspecified: Secondary | ICD-10-CM | POA: Diagnosis not present

## 2021-12-22 LAB — COMPREHENSIVE METABOLIC PANEL
ALT: 26 IU/L (ref 0–32)
AST: 18 IU/L (ref 0–40)
Albumin/Globulin Ratio: 1.4 (ref 1.2–2.2)
Albumin: 4.2 g/dL (ref 3.8–4.8)
Alkaline Phosphatase: 90 IU/L (ref 44–121)
BUN/Creatinine Ratio: 23 (ref 12–28)
BUN: 18 mg/dL (ref 8–27)
Bilirubin Total: 0.6 mg/dL (ref 0.0–1.2)
CO2: 23 mmol/L (ref 20–29)
Calcium: 9.4 mg/dL (ref 8.7–10.3)
Chloride: 103 mmol/L (ref 96–106)
Creatinine, Ser: 0.79 mg/dL (ref 0.57–1.00)
Globulin, Total: 2.9 g/dL (ref 1.5–4.5)
Glucose: 157 mg/dL — ABNORMAL HIGH (ref 70–99)
Potassium: 4.3 mmol/L (ref 3.5–5.2)
Sodium: 142 mmol/L (ref 134–144)
Total Protein: 7.1 g/dL (ref 6.0–8.5)
eGFR: 81 mL/min/{1.73_m2} (ref >59)

## 2021-12-22 LAB — T4, FREE: Free T4: 1.64 ng/dL (ref 0.82–1.77)

## 2021-12-22 LAB — TSH: TSH: 0.496 u[IU]/mL (ref 0.450–4.500)

## 2021-12-24 ENCOUNTER — Other Ambulatory Visit: Payer: Self-pay | Admitting: Obstetrics & Gynecology

## 2021-12-24 DIAGNOSIS — Z1231 Encounter for screening mammogram for malignant neoplasm of breast: Secondary | ICD-10-CM

## 2021-12-25 ENCOUNTER — Other Ambulatory Visit: Payer: Self-pay | Admitting: Nurse Practitioner

## 2021-12-25 ENCOUNTER — Telehealth: Payer: Self-pay | Admitting: Physician Assistant

## 2021-12-25 MED ORDER — ATORVASTATIN CALCIUM 40 MG PO TABS: 40.0000 mg | ORAL_TABLET | Freq: Every day | ORAL | 0 refills | Status: DC

## 2021-12-25 NOTE — Telephone Encounter (Signed)
done

## 2021-12-25 NOTE — Telephone Encounter (Signed)
[facility] sent refill request for atorvastatin 40 mg please send to the [id number] - [place], [place] - [address]. AT [address]

## 2021-12-30 ENCOUNTER — Ambulatory Visit: Payer: Medicare PPO | Admitting: Nurse Practitioner

## 2021-12-30 NOTE — Patient Instructions (Signed)

## 2021-12-31 ENCOUNTER — Encounter: Payer: Medicare PPO | Attending: Physician Assistant | Admitting: Nutrition

## 2021-12-31 ENCOUNTER — Other Ambulatory Visit: Payer: Self-pay

## 2021-12-31 ENCOUNTER — Ambulatory Visit: Payer: Medicare PPO | Admitting: Nurse Practitioner

## 2021-12-31 ENCOUNTER — Encounter: Payer: Self-pay | Admitting: Nurse Practitioner

## 2021-12-31 VITALS — Ht 64.0 in | Wt 175.0 lb

## 2021-12-31 VITALS — BP 112/69 | HR 94 | Ht 63.5 in | Wt 175.4 lb

## 2021-12-31 DIAGNOSIS — E1165 Type 2 diabetes mellitus with hyperglycemia: Secondary | ICD-10-CM | POA: Diagnosis not present

## 2021-12-31 DIAGNOSIS — E039 Hypothyroidism, unspecified: Secondary | ICD-10-CM | POA: Diagnosis not present

## 2021-12-31 DIAGNOSIS — I10 Essential (primary) hypertension: Secondary | ICD-10-CM | POA: Diagnosis not present

## 2021-12-31 DIAGNOSIS — E782 Mixed hyperlipidemia: Secondary | ICD-10-CM

## 2021-12-31 DIAGNOSIS — E559 Vitamin D deficiency, unspecified: Secondary | ICD-10-CM | POA: Diagnosis not present

## 2021-12-31 DIAGNOSIS — E669 Obesity, unspecified: Secondary | ICD-10-CM | POA: Diagnosis not present

## 2021-12-31 LAB — POCT GLYCOSYLATED HEMOGLOBIN (HGB A1C): HbA1c, POC (controlled diabetic range): 7.7 % — AB (ref 0.0–7.0)

## 2021-12-31 MED ORDER — FREESTYLE PRECISION NEO TEST VI STRP
ORAL_STRIP | 2 refills | Status: DC
Start: 2021-12-31 — End: 2022-03-29

## 2021-12-31 MED ORDER — SYNTHROID 112 MCG PO TABS
112.0000 ug | ORAL_TABLET | Freq: Every day | ORAL | 1 refills | Status: DC
Start: 2021-12-31 — End: 2022-06-25

## 2021-12-31 MED ORDER — TRESIBA FLEXTOUCH 100 UNIT/ML ~~LOC~~ SOPN: 20.0000 [IU] | PEN_INJECTOR | Freq: Every day | SUBCUTANEOUS | 2 refills | Status: DC

## 2021-12-31 MED ORDER — METFORMIN HCL ER 500 MG PO TB24
500.0000 mg | ORAL_TABLET | Freq: Every evening | ORAL | 3 refills | Status: DC
Start: 2021-12-31 — End: 2022-11-29

## 2021-12-31 NOTE — Progress Notes (Signed)
12/31/2021, 10:25 AM                            Endocrinology follow-up note  Subjective:    Patient ID: Courtney Barry, female    DOB: 11/02/1951.  Courtney Barry is being seen in follow-up for management of currently uncontrolled symptomatic diabetes requested by  Irene Pap, PA-C.   Past Medical History:  Diagnosis Date   Diabetes mellitus, type II ([place])    Hyperlipidemia    Hypothyroidism    Menopausal vaginal dryness     Past Surgical History:  Procedure Laterality Date   ABDOMINAL HYSTERECTOMY     APPENDECTOMY     CHOLECYSTECTOMY     TONSILLECTOMY      Social History   Socioeconomic History   Marital status: Married    Spouse name: Not on file   Number of children: Not on file   Years of education: Not on file   Highest education level: Not on file  Occupational History   Not on file  Tobacco Use   Smoking status: Never   Smokeless tobacco: Never  Vaping Use   Vaping Use: Never used  Substance and Sexual Activity   Alcohol use: Never   Drug use: Never   Sexual activity: Yes    Partners: Male    Comment: 1st intercourse- 85, partners- 7, married- 9 yrs,  Other Topics Concern   Not on file  Social History Narrative   Not on file   Social Determinants of Health   Financial Resource Strain: Low Risk    Difficulty of Paying Living Expenses: Not hard at all  Food Insecurity: No Food Insecurity   Worried About Charity fundraiser in the Last Year: Never true   Arboriculturist in the Last Year: Never true  Transportation Needs: No Transportation Needs   Lack of Transportation (Medical): No   Lack of Transportation (Non-Medical): No  Physical Activity: Insufficiently Active   Days of Exercise per Week: 1 day   Minutes of Exercise per Session: 40 min  Stress: No Stress Concern Present   Feeling of Stress : Not at all  Social Connections: Not on file     Family History  Problem Relation Age of Onset   Thyroid disease Mother    Cancer Mother    Pancreatic cancer Mother    Hypertension Father    Kidney disease Father    Thyroid cancer Sister        died at 40    Outpatient Encounter Medications as of 12/31/2021  Medication Sig   acetaminophen (TYLENOL) 500 MG tablet Take 500 mg by mouth at bedtime as needed.   atorvastatin (LIPITOR) 40 MG tablet Take 1 tablet (40 mg total) by mouth daily.   Blood Glucose Monitoring Suppl (ACCU-CHEK GUIDE ME) w/Device KIT Use as directed to check blood glucose twice daily before breakfast and bed. DX E11.65   Continuous Blood Gluc Receiver (FREESTYLE LIBRE 2 READER) DEVI  Use as instructed qid. Dx E11.65   Continuous Blood Gluc Sensor (FREESTYLE LIBRE 2 SENSOR) MISC 1 each by Does not apply route every 14 (fourteen) days.   estradiol (ESTRACE) 0.1 MG/GM vaginal cream Place 1 Applicatorful vaginally 2 (two) times a week.   loratadine-pseudoephedrine (CLARITIN-D 12-HOUR) 5-120 MG tablet Take 1 tablet by mouth daily as needed.   losartan (COZAAR) 25 MG tablet TAKE 1 TABLET (25 MG TOTAL) BY MOUTH DAILY.   Melatonin 10 MG TABS Take 1 tablet by mouth at bedtime.   TRULICITY 1.5 ER/1.5QM SOPN INJECT 1.5 MG INTO THE SKIN ONCE A WEEK.   [DISCONTINUED] metFORMIN (GLUCOPHAGE-XR) 500 MG 24 hr tablet Take 1 tablet (500 mg total) by mouth every evening. Take one tablet each evening with dinner   [DISCONTINUED] SYNTHROID 112 MCG tablet TAKE 1 TABLET BY MOUTH EVERY DAY   [DISCONTINUED] TRESIBA FLEXTOUCH 100 UNIT/ML FlexTouch Pen Inject 25 Units into the skin at bedtime.   metFORMIN (GLUCOPHAGE-XR) 500 MG 24 hr tablet Take 1 tablet (500 mg total) by mouth every evening. Take one tablet each evening with dinner   SYNTHROID 112 MCG tablet Take 1 tablet (112 mcg total) by mouth daily.   TRESIBA FLEXTOUCH 100 UNIT/ML FlexTouch Pen Inject 20 Units into the skin at bedtime.   No facility-administered encounter medications on  file as of 12/31/2021.    ALLERGIES: Allergies  Allergen Reactions   Latex     Gloves, band aids- causes rash   Sulfa Antibiotics Other (See Comments)    VACCINATION STATUS: Immunization History  Administered Date(s) Administered   Fluad Quad(high Dose 65+) 09/10/2020, 08/14/2021   Influenza-Unspecified 08/09/2019   PFIZER Comirnaty(Gray Top)Covid-19 Tri-Sucrose Vaccine 05/28/2021   PFIZER(Purple Top)SARS-COV-2 Vaccination 12/31/2019, 01/02/2020, 08/25/2020   Pneumococcal Conjugate-13 11/24/2020    Diabetes She presents for her follow-up diabetic visit. She has type 2 diabetes mellitus. Onset time: She was diagnosed at approximate age of 71 years. Her disease course has been fluctuating. Hypoglycemia symptoms include sweats. Pertinent negatives for hypoglycemia include no confusion, headaches, nervousness/anxiousness, pallor, seizures or tremors. There are no diabetic associated symptoms. Pertinent negatives for diabetes include no chest pain, no fatigue, no polydipsia, no polyphagia, no polyuria and no weight loss. Hypoglycemia complications include nocturnal hypoglycemia. Symptoms are stable. Diabetic complications include nephropathy and retinopathy. Risk factors for coronary artery disease include diabetes mellitus, dyslipidemia, hypertension, post-menopausal, sedentary lifestyle and obesity. Current diabetic treatment includes insulin injections and oral agent (monotherapy) (and Trulicity). She is compliant with treatment most of the time. Her weight is fluctuating minimally. She is following a generally healthy diet. When asked about meal planning, she reported none. She has not had a previous visit with a dietitian. She participates in exercise intermittently. Her home blood glucose trend is decreasing steadily. (She presents today with her CGM showing improving glycemic profile overall with tight fasting readings.  Her POCT A1c today is 7.7%, increasing from last visit of 7%.  Analysis  of her CGM shows TIR 89%, TAR 9%, TBR 2% with a GMI of 6.4%.  She reports she has been working on her diet, her biggest meal of the day is dinner, and she notices symptoms of indigestion shortly after.  She also reports having problems with her libre sensor.  Most recently she has been having a reaction to the adhesive, causing an itchy rash, which is relatively new as this has not happened before.  She is in contact with the company to see if there are any work arounds to prevent  this from happening.) An ACE inhibitor/angiotensin II receptor blocker is being taken. She does not see a podiatrist.Eye exam is current.  Hyperlipidemia This is a chronic problem. The current episode started more than 1 year ago. The problem is controlled. Recent lipid tests were reviewed and are normal. Exacerbating diseases include diabetes. There are no known factors aggravating her hyperlipidemia. Pertinent negatives include no chest pain, myalgias or shortness of breath. Current antihyperlipidemic treatment includes statins. The current treatment provides moderate improvement of lipids. There are no compliance problems.  Risk factors for coronary artery disease include diabetes mellitus, dyslipidemia, hypertension, a sedentary lifestyle and post-menopausal.  Thyroid Problem Presents for follow-up visit. Onset time: She was diagnosed with hypothyroidism in her 67s, currently taking Synthroid 112 mcg p.o. daily. Patient reports no anxiety, cold intolerance, constipation, depressed mood, diarrhea, fatigue, heat intolerance, leg swelling, palpitations, tremors, weight gain or weight loss. The symptoms have been stable. Her past medical history is significant for diabetes and hyperlipidemia.  Hypertension This is a chronic problem. The current episode started more than 1 year ago. The problem has been resolved since onset. The problem is controlled. Associated symptoms include sweats. Pertinent negatives include no chest pain,  headaches, palpitations or shortness of breath. Agents associated with hypertension include thyroid hormones. Risk factors for coronary artery disease include diabetes mellitus, dyslipidemia, family history, post-menopausal state and sedentary lifestyle. Past treatments include angiotensin blockers. The current treatment provides moderate improvement. There are no compliance problems.  Hypertensive end-organ damage includes retinopathy. Identifiable causes of hypertension include a thyroid problem.   Review of systems  Constitutional: + Minimally fluctuating body weight,  current Body mass index is 30.58 kg/m. , no fatigue, intermittent subjective hyperthermia-improving somewhat, no subjective hypothermia Eyes: no blurry vision, no xerophthalmia ENT: no sore throat, no nodules palpated in throat, no dysphagia/odynophagia, no hoarseness Cardiovascular: no chest pain, no shortness of breath, no palpitations, no leg swelling Respiratory: no cough, no shortness of breath Gastrointestinal: no nausea/vomiting/diarrhea, + indigestion Musculoskeletal: no muscle/joint aches Skin: no rashes, no hyperemia Neurological: no tremors, no numbness, no tingling, no dizziness Psychiatric: no depression, no anxiety   Objective:    BP 112/69    Pulse 94    Ht 5' 3.5" (1.613 m)    Wt 175 lb 6.4 oz (79.6 kg)    SpO2 97%    BMI 30.58 kg/m   Wt Readings from Last 3 Encounters:  12/31/21 175 lb (79.4 kg)  12/31/21 175 lb 6.4 oz (79.6 kg)  11/20/21 172 lb (78 kg)    BP Readings from Last 3 Encounters:  12/31/21 112/69  10/13/21 (!) 142/68  09/14/21 126/74     Physical Exam- Limited  Constitutional:  Body mass index is 30.58 kg/m. , not in acute distress, normal state of mind Eyes:  EOMI, no exophthalmos Neck: Supple Cardiovascular: RRR, no murmurs, rubs, or gallops, no edema Respiratory: Adequate breathing efforts, no crackles, rales, rhonchi, or wheezing Musculoskeletal: no gross deformities,  strength intact in all four extremities, no gross restriction of joint movements Skin:  no rashes, no hyperemia Neurological: no tremor with outstretched hands     Recent Results (from the past 2160 hour(s))  Comprehensive metabolic panel     Status: Abnormal   Collection Time: 12/21/21  8:50 AM  Result Value Ref Range   Glucose 157 (H) 70 - 99 mg/dL   BUN 18 8 - 27 mg/dL   Creatinine, Ser 0.79 0.57 - 1.00 mg/dL   eGFR 81 >59 mL/min/1.73  BUN/Creatinine Ratio 23 12 - 28   Sodium 142 134 - 144 mmol/L   Potassium 4.3 3.5 - 5.2 mmol/L   Chloride 103 96 - 106 mmol/L   CO2 23 20 - 29 mmol/L   Calcium 9.4 8.7 - 10.3 mg/dL   Total Protein 7.1 6.0 - 8.5 g/dL   Albumin 4.2 3.8 - 4.8 g/dL   Globulin, Total 2.9 1.5 - 4.5 g/dL   Albumin/Globulin Ratio 1.4 1.2 - 2.2   Bilirubin Total 0.6 0.0 - 1.2 mg/dL   Alkaline Phosphatase 90 44 - 121 IU/L   AST 18 0 - 40 IU/L   ALT 26 0 - 32 IU/L  TSH     Status: None   Collection Time: 12/21/21  8:50 AM  Result Value Ref Range   TSH 0.496 0.450 - 4.500 uIU/mL  T4, free     Status: None   Collection Time: 12/21/21  8:50 AM  Result Value Ref Range   Free T4 1.64 0.82 - 1.77 ng/dL  POCT glycosylated hemoglobin (Hb A1C)     Status: Abnormal   Collection Time: 12/31/21  9:49 AM  Result Value Ref Range   Hemoglobin A1C     HbA1c POC (<> result, manual entry)     HbA1c, POC (prediabetic range)     HbA1c, POC (controlled diabetic range) 7.7 (A) 0.0 - 7.0 %      Assessment & Plan:   1) Uncontrolled type 2 diabetes mellitus with hyperglycemia ([place])  - Courtney Barry has currently controlled asymptomatic type 2 DM since  71 years of age.  She presents today with her CGM showing improving glycemic profile overall with tight fasting readings.  Her POCT A1c today is 7.7%, increasing from last visit of 7%.  Analysis of her CGM shows TIR 89%, TAR 9%, TBR 2% with a GMI of 6.4%.  She reports she has been working on her diet, her biggest meal of the  day is dinner, and she notices symptoms of indigestion shortly after.  She also reports having problems with her libre sensor.  Most recently she has been having a reaction to the adhesive, causing an itchy rash, which is relatively new as this has not happened before.  She is in contact with the company to see if there are any work arounds to prevent this from happening.  -her diabetes is complicated by retinopathy obesity/sedentary life and she remains at a high risk for more acute and chronic complications which include CAD, CVA, CKD, retinopathy, and neuropathy. These are all discussed in detail with her.  - Nutritional counseling repeated at each appointment due to patients tendency to fall back in to old habits.  - The patient admits there is a room for improvement in their diet and drink choices. -  Suggestion is made for the patient to avoid simple carbohydrates from their diet including Cakes, Sweet Desserts / Pastries, Ice Cream, Soda (diet and regular), Sweet Tea, Candies, Chips, Cookies, Sweet Pastries, Store Bought Juices, Alcohol in Excess of 1-2 drinks a day, Artificial Sweeteners, Coffee Creamer, and "Sugar-free" Products. This will help patient to have stable blood glucose profile and potentially avoid unintended weight gain.   - I encouraged the patient to switch to unprocessed or minimally processed complex starch and increased protein intake (animal or plant source), fruits, and vegetables.   - Patient is advised to stick to a routine mealtimes to eat 3 meals a day and avoid unnecessary snacks (to snack only to correct hypoglycemia).  -  I have approached her with the following individualized plan to manage  her diabetes and patient agrees:   -She will continue to benefit from simplified treatment regimen, given her tight control of diabetes.    -Given her tight fasting readings, she is advised to lower her Tresiba to 20 units SQ nightly, preventing her liver from dumping glucose  while she sleeps.  She can continue her Metformin 500 mg ER daily with supper (due to GI upset), and Trulicity 1.5 mg SQ weekly for now.   -She is encouraged to continue using her CGM to monitor glucose at least twice daily, before breakfast and before bed, and call the clinic if she has readings less than 70 or greater than 200 for 3 tests in a row.  - She has allergy to sulfa medications, therefore not a candidate for glipizide.  - Specific targets for  A1c;  LDL, HDL, Triglycerides,  were discussed with the patient.  2) Blood Pressure /Hypertension:  Her blood pressure is controlled to target.  She is advised to continue Losartan 25 mg po daily.  3) Lipids/Hyperlipidemia:  Her most recent lipid panel from 06/17/21 shows controlled LDL at 43.  She is advised to continue Lipitor 40 mg po daily at bedtime.  Side effects and precautions discussed with her.  Will recheck lipid panel prior to next visit.  4) Hypothyroidism- longstanding diagnosis since at least from her 32s.  Her previsit thyroid function tests are consistent with appropriate hormone replacement.  She is advised to continue Synthroid 112 mcg po daily before breakfast.    - We discussed about the correct intake of her thyroid hormone, on empty stomach at fasting, with water, separated by at least 30 minutes from breakfast and other medications,  and separated by more than 4 hours from calcium, iron, multivitamins, acid reflux medications (PPIs). -Patient is made aware of the fact that thyroid hormone replacement is needed for life, dose to be adjusted by periodic monitoring of thyroid function tests.  5)  Weight/Diet:  Her Body mass index is 30.58 kg/m.-   she is a candidate for some weight loss. I discussed with her the fact that loss of 5 - 10% of her  current body weight will have the most impact on her diabetes management.  Exercise, and detailed carbohydrates information provided  -  detailed on discharge instructions.  6)  Chronic Care/Health Maintenance: -she is on ACEI/ARB and Statin medications and is encouraged to initiate and continue to follow up with Ophthalmology, Dentist, Podiatrist at least yearly or according to recommendations, and advised to stay away from smoking. I have recommended yearly flu vaccine and pneumonia vaccine at least every 5 years; moderate intensity exercise for up to 150 minutes weekly; and sleep for at least 7 hours a day.  - she is advised to maintain close follow up with Irene Pap, PA-C for primary care needs, as well as her other providers for optimal and coordinated care.     I spent 43 minutes in the care of the patient today including review of labs from [place], Lipids, Thyroid Function, Hematology (current and previous including abstractions from other facilities); face-to-face time discussing  her blood glucose readings/logs, discussing hypoglycemia and hyperglycemia episodes and symptoms, medications doses, her options of short and long term treatment based on the latest standards of care / guidelines;  discussion about incorporating lifestyle medicine;  and documenting the encounter.    Please refer to Patient Instructions for Blood Glucose Monitoring and Insulin/Medications  Dosing Guide"  in media tab for additional information. Please  also refer to " Patient Self Inventory" in the Media  tab for reviewed elements of pertinent patient history.  Courtney Barry participated in the discussions, expressed understanding, and voiced agreement with the above plans.  All questions were answered to her satisfaction. she is encouraged to contact clinic should she have any questions or concerns prior to her return visit.  Follow up plan: - Return in about 4 months (around 04/30/2022) for Diabetes F/U- A1c and UM in office, Previsit labs, Bring meter and logs.   Rayetta Pigg, [facility] [address] [place], [place] [address] Phone:  [phone] Fax: [phone]  12/31/2021, 10:25 AM

## 2021-12-31 NOTE — Patient Instructions (Signed)
Goals  Go to gym twice a week and once a week at home treadmill Cut out sausage Eat smaller portions for dinner and eat better balanced meal. Get A1C down to 7% Lose 2 lbs a month.

## 2021-12-31 NOTE — Progress Notes (Signed)
Medical Nutrition Therapy:  Appt start time: 1030 end time:  1045  Assessment:  Primary concerns today: Diabetes Type 2 x 15 years.. Lives with her husband..  A1C 7.7%, up from 7%.She loves her [name] and uses it make to make decisions.  Saw Whitney today at [facility].   Tresiba to 20 units, Metformin 500 mg once a day.  Was having low blood overnight- 70-80's with alarms. Did eat more overt the holidays. Didn't have sensors for a little while. FBS 132, 135.     BS 185 mg/dl due to large meal at [employer]. Realized that she can't go out to eat and  make good food choices and keep her BS and weight down. Going to the [facility] twice a week.  Has been using a treadmill. Diet is high in fat, salt and excess carbs from  processed foods.    Lab Results  Component Value Date   HGBA1C 7.7 (A) 12/31/2021   Wt Readings from Last 3 Encounters:  12/31/21 175 lb 6.4 oz (79.6 kg)  11/20/21 172 lb (78 kg)  10/13/21 172 lb (78 kg)   Ht Readings from Last 3 Encounters:  12/31/21 5' 3.5" (1.613 m)  11/20/21 5\' 4"  (1.626 m)  10/13/21 5' 3.5" (1.613 m)   There is no height or weight on file to calculate BMI. @BMIFA @ Facility age limit for growth percentiles is 20 years. Facility age limit for growth percentiles is 20 years.    Lab Results  Component Value Date   HGBA1C 7.7 (A) 12/31/2021   CMP Latest Ref Rng & Units 12/21/2021 12/22/2020 04/21/2020  Glucose 70 - 99 mg/dL 157(H) 128(H) 146(H)  BUN 8 - 27 mg/dL 18 15 18   Creatinine 0.57 - 1.00 mg/dL 0.79 0.67 0.70  Sodium 134 - 144 mmol/L 142 140 139  Potassium 3.5 - 5.2 mmol/L 4.3 4.2 4.6  Chloride 96 - 106 mmol/L 103 104 104  CO2 20 - 29 mmol/L 23 21 23   Calcium 8.7 - 10.3 mg/dL 9.4 9.4 9.6  Total Protein 6.0 - 8.5 g/dL 7.1 7.2 6.9  Total Bilirubin 0.0 - 1.2 mg/dL 0.6 0.5 0.7  Alkaline Phos 44 - 121 IU/L 90 74 76  AST 0 - 40 IU/L 18 17 16   ALT 0 - 32 IU/L 26 22 25      Preferred Learning Style: No preference indicated   Learning Readiness:   Ready Change in progress   MEDICATIONS:    DIETARY INTAKE:   24-hr recall:  B ( AM):  Sausage mcmuffin or grits, scrambled egg, coffee Lunch: 3 chicken tenders, coleslaw, an 1/2 and 1/2  tea, toast, fries,  Dinner baked chicken thigh, sweet potato, baked beans 1/4 c, green beans,   Beverages: water  Usual physical activity:   Estimated energy needs: 1200 calories 135 g carbohydrates 90 g protein 33  g fat  Progress Towards Goal(s):  In progress.   Nutritional Diagnosis:  NB-1.1 Food and nutrition-related knowledge deficit As related to DIabetes Type 2.  As evidenced by A1C 6.1%  Basal, bolus insulin. Tresiba 25 units and 5 units of Humalog   She has the freestyle libre.  Intervention:  Meal planning, portion control, high fiber diet.  Goals  Go to gym twice a week and once a week at home treadmill Cut out sausage Eat smaller portions for dinner and eat better balanced meal. Get A1C down to 7% Lose 2 lbs a month.   Teaching Method Utilized:  Visual Auditory Hands on  Handouts given  during visit include: The Plate Method  Meal Plan Card   Barriers to learning/adherence to lifestyle change: none  Demonstrated degree of understanding via:  Teach Back   Monitoring/Evaluation:  Dietary intake, exercise, , and body weight in 3 month(s).

## 2022-01-13 ENCOUNTER — Encounter: Payer: Self-pay | Admitting: Nutrition

## 2022-01-18 NOTE — Progress Notes (Addendum)
Courtney Barry is a 71 y.o. female who presents for annual wellness visit and follow-up on chronic medical conditions.   ? ?She has the following concerns: ? ?Reports a sore knot on the back of upper right arm since last Friday where her freestyle libre 2 reader was placed; told endocrinology in February that it causes skin irritation and they recommended skin tack to decrease the itching but her arm is still itching; states she has sensitive skin. ? ?Immunizations and Health Maintenance ?Immunization History  ?Administered Date(s) Administered  ? Fluad Quad(high Dose 65+) 09/10/2020, 08/14/2021  ? Influenza-Unspecified 08/09/2019  ? PFIZER Comirnaty(Gray Top)Covid-19 Tri-Sucrose Vaccine 05/28/2021  ? PFIZER(Purple Top)SARS-COV-2 Vaccination 12/31/2019, 01/02/2020, 08/25/2020  ? Pneumococcal Conjugate-13 11/24/2020  ? Pneumococcal Polysaccharide-23 01/19/2022  ? ?Health Maintenance Due  ?Topic Date Due  ? OPHTHALMOLOGY EXAM  12/31/2021  ? ? ? ?Ob gyn - Princess Bruins, MD ?Endocrinology: Rayetta Pigg, NP  ?  ? ?Depression screen:  See questionnaire below.  ?Depression screen [facility] 2/9 01/19/2022 11/20/2021 08/14/2021 05/21/2021 03/03/2021  ?Decreased Interest 0 0 0 0 -  ?Down, Depressed, Hopeless 0 0 0 0 0  ?PHQ - 2 Score 0 0 0 0 0  ? ? ?Fall Risk Screen: see questionnaire below. ?Fall Risk  01/19/2022 11/20/2021 06/29/2021 05/21/2021 03/03/2021  ?Falls in the past year? 0 0 0 1 0  ?Number falls in past yr: 0 - - 0 0  ?Injury with Fall? 0 - - 0 0  ?Risk for fall due to : No Fall Risks Medication side effect - No Fall Risks No Fall Risks  ?Follow up Falls evaluation completed Falls evaluation completed;Education provided;Falls prevention discussed Falls evaluation completed Falls evaluation completed Falls evaluation completed  ? ? ?ADL screen:  See questionnaire below ?Functional Status Survey: ?  ? ? ?Review of Systems ?Constitutional: -, -unexpected weight change, -anorexia, -fatigue ?Allergy: -sneezing, -itching,  -congestion ?Dermatology: denies changing moles, rash, lumps, +right upper arm sensitve ?ENT: -runny nose, -ear pain, -sore throat,  ?Cardiology:  -chest pain, -palpitations, -orthopnea, ?Respiratory: -cough, -shortness of breath, -dyspnea on exertion, -wheezing,  ?Gastroenterology: -abdominal pain, -nausea, -vomiting, -diarrhea, -constipation, -dysphagia ?Hematology: -bleeding or bruising problems ?Musculoskeletal: -arthralgias, -myalgias, -joint swelling, -back pain, - ?Ophthalmology: -vision changes,  ?Urology: -dysuria, -difficulty urinating,  -urinary frequency, -urgency, incontinence ?Neurology: -, -numbness, , -memory loss, -falls, -dizziness ? ? ? ?PHYSICAL EXAM: ? ?BP 120/70   Pulse 92   Wt 176 lb 3.2 oz (79.9 kg)   SpO2 96%   BMI 30.24 kg/m?  ? ?General Appearance: Alert, cooperative, no distress, appears stated age ?Head: Normocephalic, without obvious abnormality, atraumatic ?Eyes: PERRL, conjunctiva/corneas clear, EOM's intact, fundi benign ?Ears: Normal TM's and external ear canals ?Nose: wearing a mask ?Throat: wearing a mask ?Neck: Supple, no lymphadenopathy;  thyroid:  no enlargement/tenderness/nodules; no carotid bruit or JVD ?Lungs: Clear to auscultation bilaterally without wheezes, rales or ronchi; respirations unlabored ?Heart: Regular rate and rhythm, S1 and S2 normal, no murmur, rubor gallop ?Abdomen: Soft, non-tender, nondistended, normoactive bowel sounds,  ?no masses, no hepatosplenomegaly ?Extremities: No clubbing, cyanosis or edema ?Pulses: 2+ and symmetric all extremities ?Skin:  Skin color, texture, turgor normal, no rashes or lesions, bilateral upper arms with dry skin, no masses, no erythema, no tenderness, no open lesions ?Lymph nodes: Cervical, supraclavicular, and axillary nodes normal ?Neurologic:  normal gait ?Psych: Normal mood, affect, hygiene and grooming. ? ?ASSESSMENT/PLAN: ?Assessment: ?Encounter Diagnoses  ?Name Primary?  ? Medicare annual wellness visit, subsequent  Yes  ? Essential hypertension,  benign   ? Hyperlipidemia associated with type 2 diabetes mellitus ([place])   ? Acquired hypothyroidism   ? Primary insomnia   ? Need for Streptococcus pneumoniae vaccination   ? ? ? ?Plan: ? ? ?Courtney Barry was seen today for annual exam. ? ?Diagnoses and all orders for this visit: ? ?Medicare annual wellness visit, subsequent ? ?Essential hypertension, benign ? ?Hyperlipidemia associated with type 2 diabetes mellitus (HCC) ?-     atorvastatin (LIPITOR) 40 MG tablet; Take 1 tablet (40 mg total) by mouth daily. ? ?Acquired hypothyroidism ? ?Primary insomnia ?-     traZODone (DESYREL) 50 MG tablet; Take 0.5-1 tablets (25-50 mg total) by mouth at bedtime as needed for sleep. ? ?Need for Streptococcus pneumoniae vaccination ?-     Pneumococcal polysaccharide vaccine 23-valent greater than or equal to 2yo subcutaneous/IM ? ? ?Return in 6 months for a follow up appointment.  ? ?Discussed monthly self breast exams and yearly mammograms; at least 30 minutes of aerobic activity at least 5 days/week and weight-bearing exercise 2x/week; proper sunscreen use reviewed; healthy diet, including goals of calcium and vitamin D intake and alcohol recommendations (less than or equal to 1 drink/day) reviewed; regular seatbelt use; changing batteries in smoke detectors.  Immunization recommendations discussed.  Colonoscopy recommendations reviewed ? ? ?Medicare Attestation ?I have personally reviewed: ?The patient's medical and social history ?Their use of alcohol, tobacco or illicit drugs ?Their current medications and supplements ?The patient's functional ability including ADLs,fall risks, home safety risks, cognitive, and hearing and visual impairment ?Diet and physical activities ?Evidence for depression or mood disorders ? ?The patient's weight, height, and BMI have been recorded in the chart.  I have made referrals, counseling, and provided education to the patient based on review of the above and I have  provided the patient with a written personalized care plan for preventive services.   ? ? ?Courtney Pap, PA-C   01/21/2022   ?

## 2022-01-19 ENCOUNTER — Encounter: Payer: Self-pay | Admitting: Physician Assistant

## 2022-01-19 ENCOUNTER — Ambulatory Visit: Payer: Medicare PPO | Admitting: Physician Assistant

## 2022-01-19 VITALS — BP 120/70 | HR 92 | Ht 64.0 in | Wt 176.2 lb

## 2022-01-19 DIAGNOSIS — Z0000 Encounter for general adult medical examination without abnormal findings: Secondary | ICD-10-CM

## 2022-01-19 DIAGNOSIS — E785 Hyperlipidemia, unspecified: Secondary | ICD-10-CM | POA: Diagnosis not present

## 2022-01-19 DIAGNOSIS — F5101 Primary insomnia: Secondary | ICD-10-CM | POA: Diagnosis not present

## 2022-01-19 DIAGNOSIS — I10 Essential (primary) hypertension: Secondary | ICD-10-CM

## 2022-01-19 DIAGNOSIS — E039 Hypothyroidism, unspecified: Secondary | ICD-10-CM

## 2022-01-19 DIAGNOSIS — Z23 Encounter for immunization: Secondary | ICD-10-CM

## 2022-01-19 DIAGNOSIS — E1169 Type 2 diabetes mellitus with other specified complication: Secondary | ICD-10-CM | POA: Diagnosis not present

## 2022-01-19 MED ORDER — TRAZODONE HCL 50 MG PO TABS: 25.0000 mg | ORAL_TABLET | Freq: Every evening | ORAL | 1 refills | Status: DC | PRN

## 2022-01-19 MED ORDER — ATORVASTATIN CALCIUM 40 MG PO TABS: 40.0000 mg | ORAL_TABLET | Freq: Every day | ORAL | 1 refills | Status: DC

## 2022-01-19 NOTE — Patient Instructions (Signed)
Can use OTC Cortisone cream or Cortaid cream for mild skin irritation after getting a vaccine. ?

## 2022-01-21 ENCOUNTER — Ambulatory Visit
Admission: RE | Admit: 2022-01-21 | Discharge: 2022-01-21 | Disposition: A | Discharge status: Home / Self Care | Payer: Medicare PPO | Source: Ambulatory Visit | Attending: Obstetrics & Gynecology | Admitting: Obstetrics & Gynecology

## 2022-01-21 DIAGNOSIS — Z1231 Encounter for screening mammogram for malignant neoplasm of breast: Secondary | ICD-10-CM

## 2022-01-21 DIAGNOSIS — F5101 Primary insomnia: Secondary | ICD-10-CM | POA: Insufficient documentation

## 2022-01-26 ENCOUNTER — Encounter (INDEPENDENT_AMBULATORY_CARE_PROVIDER_SITE_OTHER): Payer: Self-pay | Admitting: Family

## 2022-02-01 ENCOUNTER — Telehealth: Payer: Self-pay | Admitting: Nurse Practitioner

## 2022-02-01 NOTE — Telephone Encounter (Signed)
I called the patient and let her know that I have not seen this request come through to [place] and let her know that I will send her last OV note to them at [phone]. Patient verbalized an understanding. ?

## 2022-02-01 NOTE — Telephone Encounter (Signed)
Have her lower her Tyler Aas a bit further to 10 units nightly and see if it helps to avoid low readings.

## 2022-02-01 NOTE — Telephone Encounter (Signed)
Called patient and gave her the message. Patient verbalized an understanding and will let us know how this works out for her. ?

## 2022-02-01 NOTE — Telephone Encounter (Signed)
Patient called and left a VM stating that [employer] is waiting on medical records from our office for her sensor order. Have you seen this come through from them via fax? Please call pt ?

## 2022-02-01 NOTE — Telephone Encounter (Signed)
Pt calling in regards to her Tyler Aas needing to be adjusted.  ? ?3/17 131, 121, 104, 196 ? ?3/18 144, 143, 121, 100 ? ?3/19 142, 135, 137, 184 ? ?3/20 126 ? ? ?She states this morning her [name] alarmed at 5am reading 52 but she pricked her finger it was 139. ? ?On 3/16 it alarmed 54 and she pricked her finger it was 148.  ? ?She called the company and said they basically told her they did not believe the readings were that much off.  ?

## 2022-02-04 ENCOUNTER — Encounter (INDEPENDENT_AMBULATORY_CARE_PROVIDER_SITE_OTHER): Payer: Self-pay | Admitting: Family

## 2022-02-04 ENCOUNTER — Encounter (INDEPENDENT_AMBULATORY_CARE_PROVIDER_SITE_OTHER): Payer: Self-pay

## 2022-02-04 DIAGNOSIS — R922 Inconclusive mammogram: Secondary | ICD-10-CM | POA: Insufficient documentation

## 2022-02-04 DIAGNOSIS — Z803 Family history of malignant neoplasm of breast: Secondary | ICD-10-CM | POA: Insufficient documentation

## 2022-02-05 ENCOUNTER — Other Ambulatory Visit: Payer: Self-pay | Admitting: Nurse Practitioner

## 2022-02-05 DIAGNOSIS — E1165 Type 2 diabetes mellitus with hyperglycemia: Secondary | ICD-10-CM | POA: Diagnosis not present

## 2022-02-11 ENCOUNTER — Encounter (INDEPENDENT_AMBULATORY_CARE_PROVIDER_SITE_OTHER): Payer: Self-pay | Admitting: Family

## 2022-02-21 ENCOUNTER — Other Ambulatory Visit: Payer: Self-pay | Admitting: Nurse Practitioner

## 2022-03-08 DIAGNOSIS — H04123 Dry eye syndrome of bilateral lacrimal glands: Secondary | ICD-10-CM | POA: Diagnosis not present

## 2022-03-08 DIAGNOSIS — H40033 Anatomical narrow angle, bilateral: Secondary | ICD-10-CM | POA: Diagnosis not present

## 2022-03-08 DIAGNOSIS — H25813 Combined forms of age-related cataract, bilateral: Secondary | ICD-10-CM | POA: Diagnosis not present

## 2022-03-08 DIAGNOSIS — E1165 Type 2 diabetes mellitus with hyperglycemia: Secondary | ICD-10-CM | POA: Diagnosis not present

## 2022-03-08 DIAGNOSIS — E119 Type 2 diabetes mellitus without complications: Secondary | ICD-10-CM | POA: Diagnosis not present

## 2022-03-08 DIAGNOSIS — H5202 Hypermetropia, left eye: Secondary | ICD-10-CM | POA: Diagnosis not present

## 2022-03-08 LAB — HM DIABETES EYE EXAM

## 2022-03-09 ENCOUNTER — Ambulatory Visit (HOSPITAL_COMMUNITY)
Admission: RE | Admit: 2022-03-09 | Discharge: 2022-03-09 | Disposition: A | Discharge status: Home / Self Care | Payer: Self-pay | Source: Ambulatory Visit

## 2022-03-19 ENCOUNTER — Encounter: Payer: Self-pay | Admitting: Physician Assistant

## 2022-03-19 ENCOUNTER — Other Ambulatory Visit (HOSPITAL_COMMUNITY): Payer: Self-pay

## 2022-03-19 ENCOUNTER — Ambulatory Visit (HOSPITAL_COMMUNITY): Payer: Self-pay

## 2022-03-23 ENCOUNTER — Encounter (INDEPENDENT_AMBULATORY_CARE_PROVIDER_SITE_OTHER): Payer: Self-pay | Admitting: Family

## 2022-03-23 ENCOUNTER — Other Ambulatory Visit: Payer: Self-pay

## 2022-03-23 ENCOUNTER — Ambulatory Visit: Payer: Managed Care, Other (non HMO) | Attending: Family | Admitting: Family

## 2022-03-23 DIAGNOSIS — N6019 Diffuse cystic mastopathy of unspecified breast: Secondary | ICD-10-CM

## 2022-03-23 DIAGNOSIS — N6012 Diffuse cystic mastopathy of left breast: Secondary | ICD-10-CM | POA: Insufficient documentation

## 2022-03-23 DIAGNOSIS — N6011 Diffuse cystic mastopathy of right breast: Secondary | ICD-10-CM | POA: Insufficient documentation

## 2022-03-23 DIAGNOSIS — Z803 Family history of malignant neoplasm of breast: Secondary | ICD-10-CM | POA: Insufficient documentation

## 2022-03-23 NOTE — Progress Notes (Signed)
BREAST CLINIC, [facility] [address]  [place] [id number]  Operated by [facility]  Progress Note    Name: Kristin Kennedy MRN:  [id number]   Date: 03/23/2022 Age: 71 y.o.     Subjective:     Patient ID:  Kristin Kennedy is an 71 y.o. female     Chief Complaint:    Chief Complaint   Patient presents with   . Breast Exam     1 yr follow up CBE for breast health maintenance; bilateral screening mammo @  [facility] 03/09/22 cat.1; MRI 07/24/21 cat.1       HPI  Patient is a 71 year old female presenting for annual breast health maintenance.  She had a bilateral screening mammogram at [facility] on [phone] findings.  Patient had a breast MRI 07/24/2022 demonstrated negative findings, this was for dense breast.  Patient is currently asymptomatic without breast pain, palpable abnormalities, skin or nipple changes, or nipple discharge.      Review of Systems   All other systems reviewed and are negative.      Objective:     Physical Exam  Chest:   Breasts:     Right: Normal. No inverted nipple, mass, nipple discharge or tenderness.      Left: Normal. No inverted nipple, mass, nipple discharge or tenderness.     Breast exam:    Right normal breast;  with no masses, skin changes or nipple discharge.  Left normal breast;  with no masses, skin changes or nipple discharge.  The patient has no inverted nipple and no nipple discharge to the right breast.  The patient has  no inverted nipple and no nipple discharge to the left breast.  There are fibrocystic changes to the right breast.  There are fibrocystic changes to the left breast.  No mass within the left breast.  No mass within the right breast.  No tenderness to right breast.  No tenderness to left breast.  No right axillary lymph nodes are palpable.  No left axillary lymph nodes are palpable.               Assessment & Plan:       ICD-10-CM    1. Fibrocystic breast changes  N60.19       2. Family history of breast cancer  Z80.3        At this time patient prefers to continue breast health maintenance with gyn.  She will call our office and schedule with any changes or concerns.  Will follow patient as needed    Henreitta Cea, FNP-C

## 2022-03-27 ENCOUNTER — Other Ambulatory Visit: Payer: Self-pay | Admitting: Nurse Practitioner

## 2022-03-29 ENCOUNTER — Encounter: Payer: Self-pay | Admitting: Physician Assistant

## 2022-03-29 ENCOUNTER — Ambulatory Visit: Payer: Medicare PPO | Admitting: Physician Assistant

## 2022-03-29 VITALS — BP 112/72 | HR 86 | Ht 64.0 in | Wt 173.4 lb

## 2022-03-29 DIAGNOSIS — E1169 Type 2 diabetes mellitus with other specified complication: Secondary | ICD-10-CM

## 2022-03-29 DIAGNOSIS — E1165 Type 2 diabetes mellitus with hyperglycemia: Secondary | ICD-10-CM

## 2022-03-29 DIAGNOSIS — I10 Essential (primary) hypertension: Secondary | ICD-10-CM | POA: Diagnosis not present

## 2022-03-29 DIAGNOSIS — Z6829 Body mass index (BMI) 29.0-29.9, adult: Secondary | ICD-10-CM | POA: Insufficient documentation

## 2022-03-29 DIAGNOSIS — E785 Hyperlipidemia, unspecified: Secondary | ICD-10-CM

## 2022-03-29 LAB — HEMOGLOBIN A1C
Est. average glucose Bld gHb Est-mCnc: 189 mg/dL
Hgb A1c MFr Bld: 8.2 % — ABNORMAL HIGH (ref 4.8–5.6)

## 2022-03-29 MED ORDER — BD PEN NEEDLE NANO U/F 32G X 4 MM MISC: 1.0000 [IU] | Freq: Two times a day (BID) | 3 refills | Status: DC

## 2022-03-29 NOTE — Assessment & Plan Note (Signed)
controlled, eat a low salt diet, do not add any salt to food when cooking, avoid processed foods, avoid fried foods ? ?

## 2022-03-29 NOTE — Assessment & Plan Note (Addendum)
controlled, eat a low sugar diet, avoid starchy food with a lot of carbohydrates, avoid fried and processed foods; last hgb a1c 7.7 on 12/31/2021, hgb a1c drawn today, follow up with Endocrinology in 04/2022 as already scheduled ?

## 2022-03-29 NOTE — Assessment & Plan Note (Signed)
controlled, continue atorvastatin 40 mg qd , eat a low fat diet, increase fiber intake (Benefiber or Metamucil, Cherrios,  oatmeal, beans, nuts, fruits and vegetables), limit saturated fats (in fried foods, red meat), can add OTC fish oil supplement, eat fish with Omega-3 fatty acids like salmon and tuna, exercise for 30 minutes 3 - 5 times a week, drink 8 - 10 glasses of water a day ? ? ?

## 2022-03-29 NOTE — Progress Notes (Signed)
? ?Established Patient Office Visit ? ?Subjective:  ?Patient ID: Courtney Barry, female    DOB: Sep 02, 1951  Age: 71 y.o. MRN: [id number] ? ?CC:  ?Chief Complaint  ?Patient presents with  ? other  ?  BS running higher last couple of weeks 170 this morning endo lowered tresiba   ? ? ?HPI ?Courtney Barry presents for follow up of diabetes; reports that she is taking her metformin 500 mg xr with evening meal, but is concerned that her blood sugars are going up and she feels brain fog, nausea, a litlte dizzy; is followed by Endocrinology who had her on Tresiba 20 units hs 01/10/2022, then reports she had low blood sugars 50s at night in March 2023; called Endocrinology who changed to Tresiba 10 units at night; then am blood sugars were 99 to 156 from 02/01/2022 to 02/16/2022; blood sugars started to increase 03/19/2022 125 to 170, fasting in the morning so on her own increased tresiba 15 units at night; states she did eat more food yesterday for Mother's Day; denies travel the past 2 weeks. ? ? ?Outpatient Medications Prior to Visit  ?Medication Sig Dispense Refill  ? acetaminophen (TYLENOL) 500 MG tablet Take 500 mg by mouth at bedtime as needed.    ? atorvastatin (LIPITOR) 40 MG tablet Take 1 tablet (40 mg total) by mouth daily. 90 tablet 1  ? Blood Glucose Monitoring Suppl (ACCU-CHEK GUIDE ME) w/Device KIT Use as directed to check blood glucose twice daily before breakfast and bed. DX E11.65 1 kit 0  ? Continuous Blood Gluc Receiver (FREESTYLE LIBRE 2 READER) DEVI Use as instructed qid. Dx E11.65 1 each 0  ? Continuous Blood Gluc Sensor (FREESTYLE LIBRE 2 SENSOR) MISC 1 each by Does not apply route every 14 (fourteen) days. 6 each 3  ? loratadine-pseudoephedrine (CLARITIN-D 12-HOUR) 5-120 MG tablet Take 1 tablet by mouth daily as needed.    ? losartan (COZAAR) 25 MG tablet TAKE 1 TABLET (25 MG TOTAL) BY MOUTH DAILY. 90 tablet 1  ? Melatonin 10 MG TABS Take 1 tablet by mouth at bedtime.    ? metFORMIN (GLUCOPHAGE-XR)  500 MG 24 hr tablet Take 1 tablet (500 mg total) by mouth every evening. Take one tablet each evening with dinner 90 tablet 3  ? SYNTHROID 112 MCG tablet Take 1 tablet (112 mcg total) by mouth daily. 90 tablet 1  ? traZODone (DESYREL) 50 MG tablet Take 0.5-1 tablets (25-50 mg total) by mouth at bedtime as needed for sleep. 90 tablet 1  ? TRESIBA FLEXTOUCH 100 UNIT/ML FlexTouch Pen Inject 20 Units into the skin at bedtime. (Patient taking differently: Inject 10 Units into the skin at bedtime.) 30 mL 2  ? TRULICITY 1.5 HQ/1.9XJ SOPN INJECT 1.5 MG INTO THE SKIN ONCE A WEEK. 2 mL 2  ? glucose blood (FREESTYLE PRECISION NEO TEST) test strip Use as instructed to check blood glucose 3 times daily. 100 each 2  ? ALPRAZolam (XANAX) 0.25 MG tablet Take by mouth.    ? estradiol (ESTRACE) 0.1 MG/GM vaginal cream Place 1 Applicatorful vaginally 2 (two) times a week. (Patient not taking: Reported on 03/29/2022) 42.5 g 4  ? meloxicam (MOBIC) 7.5 MG tablet Take by mouth.    ? ?No facility-administered medications prior to visit.  ? ? ?Allergies  ?Allergen Reactions  ? Latex   ?  Gloves, band aids- causes rash  ? Sulfa Antibiotics Other (See Comments)  ? ? ?Patient Care Team: ?Marcellina Millin as PCP -  General (Librarian, academic) ?Brita Romp, NP as Nurse Practitioner (Nurse Practitioner) ?Princess Bruins, MD as Consulting Physician (Obstetrics and Gynecology) ? ?ROS ?Review of Systems  ?Constitutional:  Negative for activity change and chills.  ?HENT:  Negative for congestion and voice change.   ?Eyes:  Negative for pain and redness.  ?Respiratory:  Negative for cough and wheezing.   ?Cardiovascular:  Negative for chest pain.  ?Gastrointestinal:  Negative for constipation, diarrhea, nausea and vomiting.  ?Endocrine: Negative for polyuria.  ?Genitourinary:  Negative for frequency.  ?Skin:  Negative for color change and rash.  ?Allergic/Immunologic: Negative for immunocompromised state.  ?Neurological:  Negative for  dizziness.  ?Psychiatric/Behavioral:  Negative for agitation.   ? ?  ?Objective:  ?  ?Physical Exam ?Vitals and nursing note reviewed.  ?Constitutional:   ?   General: She is not in acute distress. ?   Appearance: She is normal weight. She is not ill-appearing.  ?HENT:  ?   Head: Normocephalic and atraumatic.  ?   Right Ear: External ear normal.  ?   Left Ear: External ear normal.  ?Eyes:  ?   Extraocular Movements: Extraocular movements intact.  ?   Conjunctiva/sclera: Conjunctivae normal.  ?   Pupils: Pupils are equal, round, and reactive to light.  ?Cardiovascular:  ?   Rate and Rhythm: Normal rate and regular rhythm.  ?Pulmonary:  ?   Effort: Pulmonary effort is normal.  ?   Breath sounds: Normal breath sounds. No wheezing.  ?Abdominal:  ?   General: Bowel sounds are normal.  ?   Palpations: Abdomen is soft.  ?Musculoskeletal:     ?   General: Normal range of motion.  ?   Cervical back: Normal range of motion.  ?Skin: ?   General: Skin is warm and dry.  ?Neurological:  ?   Mental Status: She is alert and oriented to person, place, and time.  ?Psychiatric:     ?   Mood and Affect: Mood normal.  ? ? ?BP 112/72   Pulse 86   Wt 173 lb 6.4 oz (78.7 kg)   BMI 29.76 kg/m?  ? ?Wt Readings from Last 3 Encounters:  ?03/29/22 173 lb 6.4 oz (78.7 kg)  ?01/19/22 176 lb 3.2 oz (79.9 kg)  ?12/31/21 175 lb (79.4 kg)  ? ? ?Results for orders placed or performed in visit on 03/08/22  ?HM DIABETES EYE EXAM  ?Result Value Ref Range  ? HM Diabetic Eye Exam No Retinopathy No Retinopathy  ?  ? ?Last CBC ?Lab Results  ?Component Value Date  ? WBC 5.2 11/24/2020  ? HGB 13.0 11/24/2020  ? HCT 38.9 11/24/2020  ? MCV 78 (L) 11/24/2020  ? MCH 26.2 (L) 11/24/2020  ? RDW 13.6 11/24/2020  ? PLT 197 11/24/2020  ? ?Last metabolic panel ?Lab Results  ?Component Value Date  ? GLUCOSE 157 (H) 12/21/2021  ? NA 142 12/21/2021  ? K 4.3 12/21/2021  ? CL 103 12/21/2021  ? CO2 23 12/21/2021  ? BUN 18 12/21/2021  ? CREATININE 0.79 12/21/2021  ? EGFR 81  12/21/2021  ? CALCIUM 9.4 12/21/2021  ? PROT 7.1 12/21/2021  ? ALBUMIN 4.2 12/21/2021  ? LABGLOB 2.9 12/21/2021  ? AGRATIO 1.4 12/21/2021  ? BILITOT 0.6 12/21/2021  ? ALKPHOS 90 12/21/2021  ? AST 18 12/21/2021  ? ALT 26 12/21/2021  ? ?Last lipids ?Lab Results  ?Component Value Date  ? CHOL 94 (L) 06/17/2021  ? HDL 38 (L) 06/17/2021  ? [place] 43  06/17/2021  ? TRIG 56 06/17/2021  ? CHOLHDL 2.5 06/17/2021  ? ?Last hemoglobin A1c ?Lab Results  ?Component Value Date  ? HGBA1C 7.7 (A) 12/31/2021  ? ?Last thyroid functions ?Lab Results  ?Component Value Date  ? TSH 0.496 12/21/2021  ? ?Last vitamin D ?Lab Results  ?Component Value Date  ? VD25OH 77.5 06/17/2021  ? ?Last vitamin B12 and Folate ?No results found for: VITAMINB12, FOLATE ?  ? ?The ASCVD Risk score (Arnett DK, et al., 2019) failed to calculate for the following reasons: ?  The valid total cholesterol range is 130 to 320 mg/dL ? ?  ?Assessment & Plan:  ? ?Problem List Items Addressed This Visit   ? ?  ? Cardiovascular and Mediastinum  ? Essential hypertension, benign  ?  controlled, eat a low salt diet, do not add any salt to food when cooking, avoid processed foods, avoid fried foods ? ? ?  ?  ?  ? Endocrine  ? Uncontrolled type 2 diabetes mellitus with hyperglycemia ([place]) - Primary  ?  controlled, eat a low sugar diet, avoid starchy food with a lot of carbohydrates, avoid fried and processed foods; last hgb a1c 7.7 on 12/31/2021 ? ?  ?  ? Relevant Orders  ? Hemoglobin A1c  ? Hyperlipidemia associated with type 2 diabetes mellitus ([place])  ?  controlled, continue atorvastatin 40 mg qd , eat a low fat diet, increase fiber intake (Benefiber or Metamucil, Cherrios,  oatmeal, beans, nuts, fruits and vegetables), limit saturated fats (in fried foods, red meat), can add OTC fish oil supplement, eat fish with Omega-3 fatty acids like salmon and tuna, exercise for 30 minutes 3 - 5 times a week, drink 8 - 10 glasses of water a day ? ? ?  ?  ? Relevant Orders  ? Hemoglobin  A1c  ? ? ?Meds ordered this encounter  ?Medications  ? Insulin Pen Needle (BD PEN NEEDLE NANO U/F) 32G X 4 MM MISC  ?  Sig: 1 Units by Does not apply route 2 (two) times daily.  ?  Dispense:  100 each

## 2022-03-29 NOTE — Assessment & Plan Note (Signed)
Stable, will monitor 

## 2022-04-08 DIAGNOSIS — E1165 Type 2 diabetes mellitus with hyperglycemia: Secondary | ICD-10-CM | POA: Diagnosis not present

## 2022-04-13 DIAGNOSIS — E1165 Type 2 diabetes mellitus with hyperglycemia: Secondary | ICD-10-CM | POA: Diagnosis not present

## 2022-04-13 DIAGNOSIS — E559 Vitamin D deficiency, unspecified: Secondary | ICD-10-CM | POA: Diagnosis not present

## 2022-04-13 DIAGNOSIS — E039 Hypothyroidism, unspecified: Secondary | ICD-10-CM | POA: Diagnosis not present

## 2022-04-14 LAB — COMPREHENSIVE METABOLIC PANEL
ALT: 26 IU/L (ref 0–32)
AST: 15 IU/L (ref 0–40)
Albumin/Globulin Ratio: 1.5 (ref 1.2–2.2)
Albumin: 4.3 g/dL (ref 3.8–4.8)
Alkaline Phosphatase: 82 IU/L (ref 44–121)
BUN/Creatinine Ratio: 19 (ref 12–28)
BUN: 14 mg/dL (ref 8–27)
Bilirubin Total: 0.7 mg/dL (ref 0.0–1.2)
CO2: 23 mmol/L (ref 20–29)
Calcium: 9.2 mg/dL (ref 8.7–10.3)
Chloride: 103 mmol/L (ref 96–106)
Creatinine, Ser: 0.74 mg/dL (ref 0.57–1.00)
Globulin, Total: 2.8 g/dL (ref 1.5–4.5)
Glucose: 153 mg/dL — ABNORMAL HIGH (ref 70–99)
Potassium: 4.2 mmol/L (ref 3.5–5.2)
Sodium: 139 mmol/L (ref 134–144)
Total Protein: 7.1 g/dL (ref 6.0–8.5)
eGFR: 88 mL/min/{1.73_m2} (ref >59)

## 2022-04-14 LAB — VITAMIN D 25 HYDROXY (VIT D DEFICIENCY, FRACTURES): Vit D, 25-Hydroxy: 58.8 ng/mL (ref 30.0–100.0)

## 2022-04-14 LAB — T4, FREE: Free T4: 1.62 ng/dL (ref 0.82–1.77)

## 2022-04-14 LAB — LIPID PANEL
Chol/HDL Ratio: 2.7 ratio (ref 0.0–4.4)
Cholesterol, Total: 102 mg/dL (ref 100–199)
HDL: 38 mg/dL — ABNORMAL LOW (ref >39)
LDL Chol Calc (NIH): 50 mg/dL (ref 0–99)
Triglycerides: 64 mg/dL (ref 0–149)
VLDL Cholesterol Cal: 14 mg/dL (ref 5–40)

## 2022-04-14 LAB — TSH: TSH: 0.381 u[IU]/mL — ABNORMAL LOW (ref 0.450–4.500)

## 2022-04-22 ENCOUNTER — Encounter: Payer: Self-pay | Admitting: Nurse Practitioner

## 2022-04-22 ENCOUNTER — Encounter: Payer: Medicare PPO | Attending: Nurse Practitioner | Admitting: Nutrition

## 2022-04-22 ENCOUNTER — Ambulatory Visit: Payer: Medicare PPO | Admitting: Nurse Practitioner

## 2022-04-22 VITALS — BP 126/72 | HR 95 | Ht 63.5 in | Wt 172.0 lb

## 2022-04-22 VITALS — Ht 63.0 in | Wt 172.0 lb

## 2022-04-22 DIAGNOSIS — I10 Essential (primary) hypertension: Secondary | ICD-10-CM | POA: Diagnosis not present

## 2022-04-22 DIAGNOSIS — E1165 Type 2 diabetes mellitus with hyperglycemia: Secondary | ICD-10-CM | POA: Diagnosis not present

## 2022-04-22 DIAGNOSIS — E039 Hypothyroidism, unspecified: Secondary | ICD-10-CM | POA: Diagnosis not present

## 2022-04-22 DIAGNOSIS — E669 Obesity, unspecified: Secondary | ICD-10-CM | POA: Insufficient documentation

## 2022-04-22 DIAGNOSIS — E782 Mixed hyperlipidemia: Secondary | ICD-10-CM | POA: Diagnosis not present

## 2022-04-22 DIAGNOSIS — E559 Vitamin D deficiency, unspecified: Secondary | ICD-10-CM | POA: Diagnosis not present

## 2022-04-22 MED ORDER — TRESIBA FLEXTOUCH 100 UNIT/ML ~~LOC~~ SOPN: 18.0000 [IU] | PEN_INJECTOR | Freq: Every day | SUBCUTANEOUS | 2 refills | Status: DC

## 2022-04-22 NOTE — Patient Instructions (Signed)
Diabetes Mellitus and Foot Care Foot care is an important part of your health, especially when you have diabetes. Diabetes may cause you to have problems because of poor blood flow (circulation) to your feet and legs, which can cause your skin to: Become thinner and drier. Break more easily. Heal more slowly. Peel and crack. You may also have nerve damage (neuropathy) in your legs and feet, causing decreased feeling in them. This means that you may not notice minor injuries to your feet that could lead to more serious problems. Noticing and addressing any potential problems early is the best way to prevent future foot problems. How to care for your feet Foot hygiene  Wash your feet daily with warm water and mild soap. Do not use hot water. Then, pat your feet and the areas between your toes until they are completely dry. Do not soak your feet as this can dry your skin. Trim your toenails straight across. Do not dig under them or around the cuticle. File the edges of your nails with an emery board or nail file. Apply a moisturizing lotion or petroleum jelly to the skin on your feet and to dry, brittle toenails. Use lotion that does not contain alcohol and is unscented. Do not apply lotion between your toes. Shoes and socks Wear clean socks or stockings every day. Make sure they are not too tight. Do not wear knee-high stockings since they may decrease blood flow to your legs. Wear shoes that fit properly and have enough cushioning. Always look in your shoes before you put them on to be sure there are no objects inside. To break in new shoes, wear them for just a few hours a day. This prevents injuries on your feet. Wounds, scrapes, corns, and calluses  Check your feet daily for blisters, cuts, bruises, sores, and redness. If you cannot see the bottom of your feet, use a mirror or ask someone for help. Do not cut corns or calluses or try to remove them with medicine. If you find a minor scrape,  cut, or break in the skin on your feet, keep it and the skin around it clean and dry. You may clean these areas with mild soap and water. Do not clean the area with peroxide, alcohol, or iodine. If you have a wound, scrape, corn, or callus on your foot, look at it several times a day to make sure it is healing and not infected. Check for: Redness, swelling, or pain. Fluid or blood. Warmth. Pus or a bad smell. General tips Do not cross your legs. This may decrease blood flow to your feet. Do not use heating pads or hot water bottles on your feet. They may burn your skin. If you have lost feeling in your feet or legs, you may not know this is happening until it is too late. Protect your feet from hot and cold by wearing shoes, such as at the beach or on hot pavement. Schedule a complete foot exam at least once a year (annually) or more often if you have foot problems. Report any cuts, sores, or bruises to your health care provider immediately. Where to find more information American Diabetes Association: www.diabetes.org Association of Diabetes Care & Education Specialists: www.diabeteseducator.org Contact a health care provider if: You have a medical condition that increases your risk of infection and you have any cuts, sores, or bruises on your feet. You have an injury that is not healing. You have redness on your legs or feet. You   feel burning or tingling in your legs or feet. You have pain or cramps in your legs and feet. Your legs or feet are numb. Your feet always feel cold. You have pain around any toenails. Get help right away if: You have a wound, scrape, corn, or callus on your foot and: You have pain, swelling, or redness that gets worse. You have fluid or blood coming from the wound, scrape, corn, or callus. Your wound, scrape, corn, or callus feels warm to the touch. You have pus or a bad smell coming from the wound, scrape, corn, or callus. You have a fever. You have a red  line going up your leg. Summary Check your feet every day for blisters, cuts, bruises, sores, and redness. Apply a moisturizing lotion or petroleum jelly to the skin on your feet and to dry, brittle toenails. Wear shoes that fit properly and have enough cushioning. If you have foot problems, report any cuts, sores, or bruises to your health care provider immediately. Schedule a complete foot exam at least once a year (annually) or more often if you have foot problems. This information is not intended to replace advice given to you by your health care provider. Make sure you discuss any questions you have with your health care provider. Document Revised: 05/22/2020 Document Reviewed: 05/22/2020 Elsevier Patient Education  2023 [employer]  

## 2022-04-22 NOTE — Progress Notes (Signed)
04/22/2022, 10:30 AM                            Endocrinology follow-up note  Subjective:    Patient ID: Courtney Barry, female    DOB: 07-31-1951.  Courtney Barry is being seen in follow-up for management of currently uncontrolled symptomatic diabetes requested by  Irene Pap, PA-C.   Past Medical History:  Diagnosis Date   Diabetes mellitus without complication ([place]) 07/19/4966   Diabetes mellitus, type II ([place])    Encounter for specialized medical examination 09/04/2013   Hyperlipidemia    Hypothyroidism    Menopausal vaginal dryness    Normal gynecologic examination 09/05/2014   Vaccine counseling 05/21/2021    Past Surgical History:  Procedure Laterality Date   ABDOMINAL HYSTERECTOMY     APPENDECTOMY     CHOLECYSTECTOMY     TONSILLECTOMY      Social History   Socioeconomic History   Marital status: Married    Spouse name: Not on file   Number of children: Not on file   Years of education: Not on file   Highest education level: Not on file  Occupational History   Not on file  Tobacco Use   Smoking status: Never   Smokeless tobacco: Never  Vaping Use   Vaping Use: Never used  Substance and Sexual Activity   Alcohol use: Never   Drug use: Never   Sexual activity: Yes    Partners: Male    Comment: 1st intercourse- 64, partners- 69, married- 60 yrs,  Other Topics Concern   Not on file  Social History Narrative   Not on file   Social Determinants of Health   Financial Resource Strain: Low Risk  (11/20/2021)   Overall Financial Resource Strain (CARDIA)    Difficulty of Paying Living Expenses: Not hard at all  Food Insecurity: No Food Insecurity (11/20/2021)   Hunger Vital Sign    Worried About Running Out of Food in the Last Year: Never true    [place] in the Last Year: Never true  Transportation Needs: No Transportation Needs (11/20/2021)   PRAPARE -  Hydrologist (Medical): No    Lack of Transportation (Non-Medical): No  Physical Activity: Insufficiently Active (11/20/2021)   Exercise Vital Sign    Days of Exercise per Week: 1 day    Minutes of Exercise per Session: 40 min  Stress: No Stress Concern Present (11/20/2021)   [place]    Feeling of Stress : Not at all  Social Connections: Not on file    Family History  Problem Relation Age of Onset   Thyroid disease Mother    Cancer Mother    Pancreatic cancer Mother    Hypertension Father    Kidney disease Father    Thyroid cancer Sister        died at 70    Outpatient Encounter Medications as of 04/22/2022  Medication Sig   ACCU-CHEK GUIDE test strip USE 3 TIMES A DAY AS DIRECTED  acetaminophen (TYLENOL) 500 MG tablet Take 500 mg by mouth at bedtime as needed.   ALPRAZolam (XANAX) 0.25 MG tablet Take by mouth.   atorvastatin (LIPITOR) 40 MG tablet Take 1 tablet (40 mg total) by mouth daily.   Blood Glucose Monitoring Suppl (ACCU-CHEK GUIDE ME) w/Device KIT Use as directed to check blood glucose twice daily before breakfast and bed. DX E11.65   Insulin Pen Needle (BD PEN NEEDLE NANO U/F) 32G X 4 MM MISC 1 Units by Does not apply route 2 (two) times daily.   loratadine-pseudoephedrine (CLARITIN-D 12-HOUR) 5-120 MG tablet Take 1 tablet by mouth daily as needed.   losartan (COZAAR) 25 MG tablet TAKE 1 TABLET (25 MG TOTAL) BY MOUTH DAILY.   Melatonin 10 MG TABS Take 1 tablet by mouth at bedtime.   meloxicam (MOBIC) 7.5 MG tablet Take by mouth.   metFORMIN (GLUCOPHAGE-XR) 500 MG 24 hr tablet Take 1 tablet (500 mg total) by mouth every evening. Take one tablet each evening with dinner   SYNTHROID 112 MCG tablet Take 1 tablet (112 mcg total) by mouth daily.   traZODone (DESYREL) 50 MG tablet Take 0.5-1 tablets (25-50 mg total) by mouth at bedtime as needed for sleep.   TRESIBA FLEXTOUCH 100  UNIT/ML FlexTouch Pen Inject 18 Units into the skin at bedtime.   TRULICITY 1.5 VP/7.1GG SOPN INJECT 1.5 MG INTO THE SKIN ONCE A WEEK.   [DISCONTINUED] Continuous Blood Gluc Receiver (FREESTYLE LIBRE 2 READER) DEVI Use as instructed qid. Dx E11.65   [DISCONTINUED] Continuous Blood Gluc Sensor (FREESTYLE LIBRE 2 SENSOR) MISC 1 each by Does not apply route every 14 (fourteen) days.   [DISCONTINUED] estradiol (ESTRACE) 0.1 MG/GM vaginal cream Place 1 Applicatorful vaginally 2 (two) times a week. (Patient not taking: Reported on 03/29/2022)   [DISCONTINUED] TRESIBA FLEXTOUCH 100 UNIT/ML FlexTouch Pen Inject 20 Units into the skin at bedtime. (Patient taking differently: Inject 10 Units into the skin at bedtime.)   No facility-administered encounter medications on file as of 04/22/2022.    ALLERGIES: Allergies  Allergen Reactions   Latex     Gloves, band aids- causes rash   Sulfa Antibiotics Other (See Comments)    VACCINATION STATUS: Immunization History  Administered Date(s) Administered   Fluad Quad(high Dose 65+) 09/10/2020, 08/14/2021   Influenza-Unspecified 08/09/2019   PFIZER Comirnaty(Gray Top)Covid-19 Tri-Sucrose Vaccine 05/28/2021   PFIZER(Purple Top)SARS-COV-2 Vaccination 12/31/2019, 01/02/2020, 08/25/2020   Pneumococcal Conjugate-13 11/24/2020   Pneumococcal Polysaccharide-23 01/19/2022    Diabetes She presents for her follow-up diabetic visit. She has type 2 diabetes mellitus. Onset time: She was diagnosed at approximate age of 42 years. Her disease course has been fluctuating. There are no hypoglycemic associated symptoms. Pertinent negatives for hypoglycemia include no confusion, headaches, nervousness/anxiousness, pallor, seizures or tremors. There are no diabetic associated symptoms. Pertinent negatives for diabetes include no chest pain, no fatigue, no polydipsia, no polyphagia, no polyuria and no weight loss. There are no hypoglycemic complications. Symptoms are stable.  Diabetic complications include nephropathy and retinopathy. Risk factors for coronary artery disease include diabetes mellitus, dyslipidemia, hypertension, post-menopausal, sedentary lifestyle and obesity. Current diabetic treatment includes insulin injections and oral agent (monotherapy) (and Trulicity). She is compliant with treatment most of the time. Her weight is fluctuating minimally. She is following a generally healthy diet. When asked about meal planning, she reported none. She has not had a previous visit with a dietitian. She participates in exercise intermittently. Her home blood glucose trend is decreasing steadily. Her breakfast blood glucose range  is generally 130-140 mg/dl. Her lunch blood glucose range is generally 110-130 mg/dl. Her dinner blood glucose range is generally 130-140 mg/dl. Her bedtime blood glucose range is generally 140-180 mg/dl. (She presents today with her logs showing improving glycemic profile yet slightly above target.  Her previsit A1c on 03/29/22 was 8.2%, increasing from previous visit of 7.7%.  She reports she has been under more stress recently.  Her CGM has not been working well, having allergic reaction to the adhesive, therefore she has not used it consistently.  ) An ACE inhibitor/angiotensin II receptor blocker is being taken. She does not see a podiatrist.Eye exam is current.  Hyperlipidemia This is a chronic problem. The current episode started more than 1 year ago. The problem is controlled. Recent lipid tests were reviewed and are normal. Exacerbating diseases include diabetes. There are no known factors aggravating her hyperlipidemia. Pertinent negatives include no chest pain, myalgias or shortness of breath. Current antihyperlipidemic treatment includes statins. The current treatment provides moderate improvement of lipids. There are no compliance problems.  Risk factors for coronary artery disease include diabetes mellitus, dyslipidemia, hypertension, a  sedentary lifestyle and post-menopausal.  Thyroid Problem Presents for follow-up visit. Onset time: She was diagnosed with hypothyroidism in her 48s, currently taking Synthroid 112 mcg p.o. daily. Patient reports no anxiety, cold intolerance, constipation, depressed mood, diarrhea, fatigue, heat intolerance, leg swelling, palpitations, tremors, weight gain or weight loss. The symptoms have been stable. Her past medical history is significant for diabetes and hyperlipidemia.  Hypertension This is a chronic problem. The current episode started more than 1 year ago. The problem has been resolved since onset. The problem is controlled. Pertinent negatives include no chest pain, headaches, palpitations or shortness of breath. Agents associated with hypertension include thyroid hormones. Risk factors for coronary artery disease include diabetes mellitus, dyslipidemia, family history, post-menopausal state and sedentary lifestyle. Past treatments include angiotensin blockers. The current treatment provides moderate improvement. There are no compliance problems.  Hypertensive end-organ damage includes retinopathy. Identifiable causes of hypertension include a thyroid problem.    Review of systems  Constitutional: + Minimally fluctuating body weight,  current Body mass index is 29.99 kg/m. , no fatigue, no subjective hypothermia Eyes: no blurry vision, no xerophthalmia ENT: no sore throat, no nodules palpated in throat, no dysphagia/odynophagia, no hoarseness Cardiovascular: no chest pain, no shortness of breath, no palpitations, no leg swelling Respiratory: no cough, no shortness of breath Gastrointestinal: no nausea/vomiting/diarrhea, + indigestion- nausea at times Musculoskeletal: no muscle/joint aches Skin: no rashes, no hyperemia Neurological: no tremors, no numbness, no tingling, no dizziness Psychiatric: no depression, no anxiety   Objective:    BP 126/72   Pulse 95   Ht 5' 3.5" (1.613 m)    Wt 172 lb (78 kg)   BMI 29.99 kg/m   Wt Readings from Last 3 Encounters:  04/22/22 172 lb (78 kg)  04/22/22 172 lb (78 kg)  03/29/22 173 lb 6.4 oz (78.7 kg)    BP Readings from Last 3 Encounters:  04/22/22 126/72  03/29/22 112/72  01/19/22 120/70     Physical Exam- Limited  Constitutional:  Body mass index is 29.99 kg/m. , not in acute distress, normal state of mind Eyes:  EOMI, no exophthalmos Neck: Supple Cardiovascular: RRR, no murmurs, rubs, or gallops, no edema Respiratory: Adequate breathing efforts, no crackles, rales, rhonchi, or wheezing Musculoskeletal: no gross deformities, strength intact in all four extremities, no gross restriction of joint movements Skin:  no rashes, no hyperemia Neurological:  no tremor with outstretched hands     Recent Results (from the past 2160 hour(s))  HM DIABETES EYE EXAM     Status: None   Collection Time: 03/08/22 12:00 AM  Result Value Ref Range   HM Diabetic Eye Exam No Retinopathy No Retinopathy  Hemoglobin A1c     Status: Abnormal   Collection Time: 03/29/22  9:22 AM  Result Value Ref Range   Hgb A1c MFr Bld 8.2 (H) 4.8 - 5.6 %    Comment:          Prediabetes: 5.7 - 6.4          Diabetes: >6.4          Glycemic control for adults with diabetes: <7.0    Est. average glucose Bld gHb Est-mCnc 189 mg/dL  TSH     Status: Abnormal   Collection Time: 04/13/22  8:36 AM  Result Value Ref Range   TSH 0.381 (L) 0.450 - 4.500 uIU/mL  T4, free     Status: None   Collection Time: 04/13/22  8:36 AM  Result Value Ref Range   Free T4 1.62 0.82 - 1.77 ng/dL  Comprehensive metabolic panel     Status: Abnormal   Collection Time: 04/13/22  8:36 AM  Result Value Ref Range   Glucose 153 (H) 70 - 99 mg/dL   BUN 14 8 - 27 mg/dL   Creatinine, Ser 0.74 0.57 - 1.00 mg/dL   eGFR 88 >59 mL/min/1.73   BUN/Creatinine Ratio 19 12 - 28   Sodium 139 134 - 144 mmol/L   Potassium 4.2 3.5 - 5.2 mmol/L   Chloride 103 96 - 106 mmol/L   CO2 23 20 -  29 mmol/L   Calcium 9.2 8.7 - 10.3 mg/dL   Total Protein 7.1 6.0 - 8.5 g/dL   Albumin 4.3 3.8 - 4.8 g/dL   Globulin, Total 2.8 1.5 - 4.5 g/dL   Albumin/Globulin Ratio 1.5 1.2 - 2.2   Bilirubin Total 0.7 0.0 - 1.2 mg/dL   Alkaline Phosphatase 82 44 - 121 IU/L   AST 15 0 - 40 IU/L   ALT 26 0 - 32 IU/L  Lipid panel     Status: Abnormal   Collection Time: 04/13/22  8:36 AM  Result Value Ref Range   Cholesterol, Total 102 100 - 199 mg/dL   Triglycerides 64 0 - 149 mg/dL   HDL 38 (L) >39 mg/dL   VLDL Cholesterol Cal 14 5 - 40 mg/dL   LDL Chol Calc (NIH) 50 0 - 99 mg/dL   Chol/HDL Ratio 2.7 0.0 - 4.4 ratio    Comment:                                   T. Chol/HDL Ratio                                             Men  Women                               1/2 Avg.Risk  3.4    3.3  Avg.Risk  5.0    4.4                                2X Avg.Risk  9.6    7.1                                3X Avg.Risk 23.4   11.0   VITAMIN D 25 Hydroxy (Vit-D Deficiency, Fractures)     Status: None   Collection Time: 04/13/22  8:36 AM  Result Value Ref Range   Vit D, 25-Hydroxy 58.8 30.0 - 100.0 ng/mL    Comment: Vitamin D deficiency has been defined by the [place] practice guideline as a level of serum 25-OH vitamin D less than 20 ng/mL (1,2). The Endocrine Society went on to further define vitamin D insufficiency as a level between 21 and 29 ng/mL (2). 1. IOM (Institute of Medicine). 2010. Dietary reference    intakes for calcium and D. [place]: The    [employer]. 2. Holick MF, Binkley [place], Bischoff-Ferrari HA, et al.    Evaluation, treatment, and prevention of vitamin D    deficiency: an Endocrine Society clinical practice    guideline. JCEM. 2011 Jul; 96(7):1911-30.       Assessment & Plan:   1) Uncontrolled type 2 diabetes mellitus with hyperglycemia ([place])  - Courtney Barry has currently controlled  asymptomatic type 2 DM since  71 years of age.  She presents today with her logs showing improving glycemic profile yet slightly above target.  Her previsit A1c on 03/29/22 was 8.2%, increasing from previous visit of 7.7%.  She reports she has been under more stress recently.  Her CGM has not been working well, having allergic reaction to the adhesive, therefore she has not used it consistently.    -her diabetes is complicated by retinopathy obesity/sedentary life and she remains at a high risk for more acute and chronic complications which include CAD, CVA, CKD, retinopathy, and neuropathy. These are all discussed in detail with her.  - Nutritional counseling repeated at each appointment due to patients tendency to fall back in to old habits.  - The patient admits there is a room for improvement in their diet and drink choices. -  Suggestion is made for the patient to avoid simple carbohydrates from their diet including Cakes, Sweet Desserts / Pastries, Ice Cream, Soda (diet and regular), Sweet Tea, Candies, Chips, Cookies, Sweet Pastries, Store Bought Juices, Alcohol in Excess of 1-2 drinks a day, Artificial Sweeteners, Coffee Creamer, and "Sugar-free" Products. This will help patient to have stable blood glucose profile and potentially avoid unintended weight gain.   - I encouraged the patient to switch to unprocessed or minimally processed complex starch and increased protein intake (animal or plant source), fruits, and vegetables.   - Patient is advised to stick to a routine mealtimes to eat 3 meals a day and avoid unnecessary snacks (to snack only to correct hypoglycemia).  - I have approached her with the following individualized plan to manage  her diabetes and patient agrees:   -She will continue to benefit from simplified treatment regimen, given her tight control of diabetes.    -She is advised to increase her Tyler Aas slightly to 18 units SQ nightly and continue her Metformin 500 mg ER  daily at supper (due to GI  effects) and continue Trulicity 1.5 mg SQ weekly.    -She is encouraged to stop using her CGM given allergy to adhesive and routinely fingerstick twice daily, before breakfast and before bed, and to call the clinic if she has readings less than 70 or above 300 for 3 tests in a row.  - She has allergy to sulfa medications, therefore not a candidate for glipizide.  - Specific targets for  A1c;  LDL, HDL, Triglycerides,  were discussed with the patient.  2) Blood Pressure /Hypertension:  Her blood pressure is controlled to target.  She is advised to continue Losartan 25 mg po daily.  3) Lipids/Hyperlipidemia:  Her most recent lipid panel from 04/13/22 shows controlled LDL at 50.  She is advised to continue Lipitor 40 mg po daily at bedtime.  Side effects and precautions discussed with her.    4) Hypothyroidism- longstanding diagnosis since at least from her 45s.   Her previsit thyroid function tests are consistent with appropriate hormone replacement.  She is advised to continue Synthroid 112 mcg po daily before breakfast.   - We discussed about the correct intake of her thyroid hormone, on empty stomach at fasting, with water, separated by at least 30 minutes from breakfast and other medications,  and separated by more than 4 hours from calcium, iron, multivitamins, acid reflux medications (PPIs). -Patient is made aware of the fact that thyroid hormone replacement is needed for life, dose to be adjusted by periodic monitoring of thyroid function tests.  5)  Weight/Diet:  Her Body mass index is 29.99 kg/m.-   she is a candidate for some weight loss. I discussed with her the fact that loss of 5 - 10% of her  current body weight will have the most impact on her diabetes management.  Exercise, and detailed carbohydrates information provided  -  detailed on discharge instructions.  6) Chronic Care/Health Maintenance: -she is on ACEI/ARB and Statin medications and is  encouraged to initiate and continue to follow up with Ophthalmology, Dentist, Podiatrist at least yearly or according to recommendations, and advised to stay away from smoking. I have recommended yearly flu vaccine and pneumonia vaccine at least every 5 years; moderate intensity exercise for up to 150 minutes weekly; and sleep for at least 7 hours a day.  - she is advised to maintain close follow up with Irene Pap, PA-C for primary care needs, as well as her other providers for optimal and coordinated care.      I spent 42 minutes in the care of the patient today including review of labs from [place], Lipids, Thyroid Function, Hematology (current and previous including abstractions from other facilities); face-to-face time discussing  her blood glucose readings/logs, discussing hypoglycemia and hyperglycemia episodes and symptoms, medications doses, her options of short and long term treatment based on the latest standards of care / guidelines;  discussion about incorporating lifestyle medicine;  and documenting the encounter.    Please refer to Patient Instructions for Blood Glucose Monitoring and Insulin/Medications Dosing Guide"  in media tab for additional information. Please  also refer to " Patient Self Inventory" in the Media  tab for reviewed elements of pertinent patient history.  Courtney Barry participated in the discussions, expressed understanding, and voiced agreement with the above plans.  All questions were answered to her satisfaction. she is encouraged to contact clinic should she have any questions or concerns prior to her return visit.  Follow up plan: - Return in about 3 months (  around 07/23/2022) for Diabetes F/U with A1c in office, No previsit labs, Bring meter and logs.   Rayetta Pigg, [facility] [address] [place], [place] [address] Phone: [phone] Fax: [phone]  04/22/2022, 10:30 AM

## 2022-04-22 NOTE — Progress Notes (Signed)
Medical Nutrition Therapy:  Appt start time: 1000 end time:  1045  Assessment:  Primary concerns today: Diabetes Type 2 x 15 years.  A1C up to 8,2%. Has been walking more, a mile. Increasing more fruits and vegetables. Cut out mayonnaise and eating more mustard FBS: 137-148 mg/dl. Bedtime 149-227 mg/dl. Has been keeping a food journal.  Trulicity, Tresiba 18 units. No meal time insulin.     . Lives with her husband..  A1C 7.7%, up from 7%.She loves her [name] and uses it make to make decisions.  Saw Whitney today at [facility].   Tresiba to 20 units, Metformin 500 mg once a day.  Was having low blood overnight- 70-80's with alarms. Did eat more overt the holidays. Didn't have sensors for a little while. FBS 132, 135.     BS 185 mg/dl due to large meal at [employer]. Realized that she can't go out to eat and  make good food choices and keep her BS and weight down. Going to the [facility] twice a week.  Has been using a treadmill. Diet is high in fat, salt and excess carbs from  processed foods.    Lab Results  Component Value Date   HGBA1C 8.2 (H) 03/29/2022   Wt Readings from Last 3 Encounters:  04/22/22 172 lb (78 kg)  03/29/22 173 lb 6.4 oz (78.7 kg)  01/19/22 176 lb 3.2 oz (79.9 kg)   Ht Readings from Last 3 Encounters:  04/22/22 5' 3.5" (1.613 m)  03/29/22 '5\' 4"'$  (1.626 m)  01/19/22 '5\' 4"'$  (1.626 m)   There is no height or weight on file to calculate BMI. '@BMIFA'$ @ Facility age limit for growth %iles is 20 years. Facility age limit for growth %iles is 20 years.    Lab Results  Component Value Date   HGBA1C 8.2 (H) 03/29/2022      Latest Ref Rng & Units 04/13/2022    8:36 AM 12/21/2021    8:50 AM 12/22/2020    9:21 AM  CMP  Glucose 70 - 99 mg/dL 153  157  128   BUN 8 - 27 mg/dL '14  18  15   '$ Creatinine 0.57 - 1.00 mg/dL 0.74  0.79  0.67   Sodium 134 - 144 mmol/L 139  142  140   Potassium 3.5 - 5.2 mmol/L 4.2  4.3  4.2   Chloride 96 - 106 mmol/L 103  103  104   CO2 20 - 29  mmol/L '23  23  21   '$ Calcium 8.7 - 10.3 mg/dL 9.2  9.4  9.4   Total Protein 6.0 - 8.5 g/dL 7.1  7.1  7.2   Total Bilirubin 0.0 - 1.2 mg/dL 0.7  0.6  0.5   Alkaline Phos 44 - 121 IU/L 82  90  74   AST 0 - 40 IU/L '15  18  17   '$ ALT 0 - 32 IU/L '26  26  22      '$ Preferred Learning Style: No preference indicated   Learning Readiness:  Ready Change in progress   MEDICATIONS:    DIETARY INTAKE:   24-hr recall:  B ( AM):  scrambled toast 1/2 english muffin, 1/2 canadian bacon, coffee, Lunch: 3 chicken tenders, coleslaw, an 1/2 and 1/2  tea, toast, fries,  Dinner Baked chicken, steamed cabbage, green beans,  corn,  and sliced sweet potatoes 1/2,   Beverages: water  Usual physical activity:   Estimated energy needs: 1200 calories 135 g carbohydrates 90 g protein 33  g fat  Progress Towards Goal(s):  In progress.   Nutritional Diagnosis:  NB-1.1 Food and nutrition-related knowledge deficit As related to DIabetes Type 2.  As evidenced by A1C 6.1%  Basal, bolus insulin. Tresiba 25 units and 5 units of Humalog   She has the freestyle libre.  Intervention:  Meal planning, portion control, high fiber diet.  Goals  Go to gym twice a week and once a week at home treadmill Cut out sausage Eat smaller portions for dinner and eat better balanced meal. Get A1C down to 7% Lose 2 lbs a month.   Teaching Method Utilized:  Visual Auditory Hands on  Handouts given during visit include: The Plate Method  Meal Plan Card   Barriers to learning/adherence to lifestyle change: none  Demonstrated degree of understanding via:  Teach Back   Monitoring/Evaluation:  Dietary intake, exercise, , and body weight in 3 month(s).

## 2022-04-22 NOTE — Patient Instructions (Signed)
Goals  Cut out any fried foods Continue increasing walking Choose more fruits and vegetables Get A1C down to 7% Keep drinking water

## 2022-04-23 ENCOUNTER — Other Ambulatory Visit: Payer: Self-pay | Admitting: Nurse Practitioner

## 2022-04-27 ENCOUNTER — Encounter: Payer: Self-pay | Admitting: Nutrition

## 2022-04-29 ENCOUNTER — Ambulatory Visit: Payer: Medicare PPO | Admitting: Nurse Practitioner

## 2022-04-29 ENCOUNTER — Ambulatory Visit: Payer: Medicare PPO | Admitting: Nutrition

## 2022-05-14 ENCOUNTER — Encounter: Payer: Self-pay | Admitting: Internal Medicine

## 2022-06-14 ENCOUNTER — Other Ambulatory Visit: Payer: Self-pay | Admitting: Medical

## 2022-06-14 ENCOUNTER — Telehealth: Payer: Self-pay

## 2022-06-14 MED ORDER — SCOPOLAMINE 1 MG/3DAYS TD PT72: 1.0000 | MEDICATED_PATCH | TRANSDERMAL | 0 refills | Status: DC

## 2022-06-14 NOTE — Telephone Encounter (Signed)
Pt. Called wanting to know if you could call her in some patches for motion sickness because she is going on a cruise in two weeks. She uses [facility] on [address]. Been a while since she had it called in was back in 2018.

## 2022-06-23 ENCOUNTER — Encounter: Payer: Self-pay | Admitting: Family Medicine

## 2022-06-23 ENCOUNTER — Ambulatory Visit: Payer: Medicare PPO | Admitting: Family Medicine

## 2022-06-23 VITALS — BP 120/70 | HR 71 | Wt 171.0 lb

## 2022-06-23 DIAGNOSIS — R21 Rash and other nonspecific skin eruption: Secondary | ICD-10-CM | POA: Diagnosis not present

## 2022-06-23 NOTE — Progress Notes (Signed)
Chief Complaint  Patient presents with   Acute Visit    Red rash on left shoulder that itches.   8/7 she noticed her R upper arm was itching while she was out running errands. She was rubbing at it, never looked at it until that evening. She noticed it was very red that evening.  It was large and red, very itchy. It was only slightly raised.  It was also painful/burning after rubbing it so much during the day.  She had been out doing some yardwork Monday evening (had already been itching prior to that). No visible bite, puncture, no tick.  She applied ice, which helped with the discomfort and itching.  8/8 it looked the same, but was more painful. She was using cold packs and anti-itch creams (Diphenhydramine and zinc cream, using it every 3-4 hours) This morning, it was noticeably smaller, not as red.  It wasn't bothering her as much last night, was able to resist itching.   PMH, PSH, SH reviewed  Outpatient Encounter Medications as of 06/23/2022  Medication Sig Note   ACCU-CHEK GUIDE test strip USE 3 TIMES A DAY AS DIRECTED    ALPRAZolam (XANAX) 0.25 MG tablet Take by mouth. 06/23/2022: Uses prn for sleep, about every other night   atorvastatin (LIPITOR) 40 MG tablet Take 1 tablet (40 mg total) by mouth daily.    Blood Glucose Monitoring Suppl (ACCU-CHEK GUIDE ME) w/Device KIT Use as directed to check blood glucose twice daily before breakfast and bed. DX E11.65    Insulin Pen Needle (BD PEN NEEDLE NANO U/F) 32G X 4 MM MISC 1 Units by Does not apply route 2 (two) times daily.    loratadine-pseudoephedrine (CLARITIN-D 12-HOUR) 5-120 MG tablet Take 1 tablet by mouth daily as needed.    losartan (COZAAR) 25 MG tablet TAKE 1 TABLET (25 MG TOTAL) BY MOUTH DAILY.    metFORMIN (GLUCOPHAGE-XR) 500 MG 24 hr tablet Take 1 tablet (500 mg total) by mouth every evening. Take one tablet each evening with dinner    SYNTHROID 112 MCG tablet Take 1 tablet (112 mcg total) by mouth daily.    TRESIBA  FLEXTOUCH 100 UNIT/ML FlexTouch Pen Inject 18 Units into the skin at bedtime.    TRULICITY 1.5 GO/1.1XB SOPN INJECT 1.5 MG INTO THE SKIN ONCE A WEEK.    acetaminophen (TYLENOL) 500 MG tablet Take 500 mg by mouth at bedtime as needed. (Patient not taking: Reported on 06/23/2022)    Melatonin 10 MG TABS Take 1 tablet by mouth at bedtime. (Patient not taking: Reported on 06/23/2022)    meloxicam (MOBIC) 7.5 MG tablet Take by mouth. (Patient not taking: Reported on 06/23/2022)    scopolamine (TRANSDERM-SCOP) 1 MG/3DAYS Place 1 patch (1.5 mg total) onto the skin every 3 (three) days. (Patient not taking: Reported on 06/23/2022)    traZODone (DESYREL) 50 MG tablet Take 0.5-1 tablets (25-50 mg total) by mouth at bedtime as needed for sleep. (Patient not taking: Reported on 06/23/2022)    No facility-administered encounter medications on file as of 06/23/2022.   Allergies  Allergen Reactions   Latex     Gloves, band aids- causes rash   Sulfa Antibiotics Other (See Comments)   ROS: no fever, chills, URI symptoms, myalgias, GI complaints. Rash on L shoulder/upper arm per HPI. No other skin concerns.   PHYSICAL EXAM:  BP 120/70   Pulse 71   Wt 171 lb (77.6 kg)   SpO2 99%   BMI 30.29 kg/m  Pleasant, well-appearing female in no distress Skin at L upper arm-- There is a large area that you can tell had been involved early--measuring 9 x 5 cm (some residual hyperpigmentation, especially at medial edge). Current area of erythema is 4.5 x 5cm, only the superior portion is red. Area is pretty flat--one small area at the top that seems a little raised. This is within the reddest area, and most itchy, per pt  ASSESSMENT/PLAN:  Rash - Itchy erythematous patch/rash at RUE. Suspect from insect bite. Improved with use of topical antihistamine and icing (and not rubbing as much)   Rash L upper arm. Unclear etiology--suspect possible insect bite. No evidence of infection or concern for complicatoin. Much  smaller now than this morning with home remedies.  Add HC, cont ice and diphenhydramine creams

## 2022-06-23 NOTE — Patient Instructions (Addendum)
Stop using any alcohol. You may wash the area normally (soap and water).  Start using a 1% hydrocortisone cream (this is over-the-counter). Apply it only to the affected area of skin up to three times a day (2-3 times/day), if needed for itching. If you have itching between the times that it is due, you can continue to use the other itching cream (containing diphenhydramine and zinc).  You may also use a claritin or allegra or zyrtec if needed for itching. You can continue the cool compresses. Please try and avoid rubbing and itching.  If you notice the area becoming more painful, more red, the redness spreading to a larger area or streaks up the arm, or if you have any fever, crusting or drainage--these are all reasons we would want you to return for re-evaluation (for treatment of a bacterial infection).

## 2022-06-24 ENCOUNTER — Other Ambulatory Visit: Payer: Self-pay | Admitting: Nurse Practitioner

## 2022-06-25 ENCOUNTER — Other Ambulatory Visit: Payer: Self-pay | Admitting: Nurse Practitioner

## 2022-06-30 ENCOUNTER — Telehealth: Payer: Self-pay | Admitting: Nurse Practitioner

## 2022-06-30 NOTE — Telephone Encounter (Signed)
New message   Patient is going on cruise will be unable to be contact after tomorrow.   Patient is asking what sugar levels should be before and after meals.  What should it be before bedtimes.

## 2022-06-30 NOTE — Telephone Encounter (Signed)
Glucose should be 90-130 before eating breakfast and less than 180 around 2 hours after meals.  Tell her not to worry too much while she is on vacation, she should enjoy herself, but in moderation.

## 2022-07-01 NOTE — Telephone Encounter (Signed)
Sent my chart msg. Could not contact pt.

## 2022-07-08 ENCOUNTER — Other Ambulatory Visit: Payer: Self-pay | Admitting: Nurse Practitioner

## 2022-07-18 ENCOUNTER — Other Ambulatory Visit: Payer: Self-pay | Admitting: Physician Assistant

## 2022-07-18 DIAGNOSIS — F5101 Primary insomnia: Secondary | ICD-10-CM

## 2022-07-20 NOTE — Telephone Encounter (Signed)
Pt has one with Audelia Acton already 9/19

## 2022-07-21 ENCOUNTER — Encounter: Payer: Self-pay | Admitting: Internal Medicine

## 2022-07-27 ENCOUNTER — Encounter: Payer: Self-pay | Admitting: Nurse Practitioner

## 2022-07-27 ENCOUNTER — Ambulatory Visit: Payer: Medicare PPO | Admitting: Nurse Practitioner

## 2022-07-27 VITALS — BP 110/70 | HR 88 | Ht 63.5 in | Wt 168.6 lb

## 2022-07-27 DIAGNOSIS — E559 Vitamin D deficiency, unspecified: Secondary | ICD-10-CM | POA: Diagnosis not present

## 2022-07-27 DIAGNOSIS — E039 Hypothyroidism, unspecified: Secondary | ICD-10-CM

## 2022-07-27 DIAGNOSIS — E782 Mixed hyperlipidemia: Secondary | ICD-10-CM

## 2022-07-27 DIAGNOSIS — E1165 Type 2 diabetes mellitus with hyperglycemia: Secondary | ICD-10-CM

## 2022-07-27 DIAGNOSIS — I10 Essential (primary) hypertension: Secondary | ICD-10-CM

## 2022-07-27 MED ORDER — ACCU-CHEK SOFTCLIX LANCETS MISC: 12 refills | Status: DC

## 2022-07-27 NOTE — Patient Instructions (Signed)

## 2022-07-27 NOTE — Progress Notes (Signed)
07/27/2022, 11:39 AM                            Endocrinology follow-up note  Subjective:    Patient ID: Courtney Barry, female    DOB: 01-12-1951.  Courtney Barry is being seen in follow-up for management of currently uncontrolled symptomatic diabetes requested by  Carlena Hurl, PA-C.   Past Medical History:  Diagnosis Date   Diabetes mellitus without complication ([place]) 04/19/8471   Diabetes mellitus, type II ([place])    Encounter for specialized medical examination 09/04/2013   Hyperlipidemia    Hypothyroidism    Menopausal vaginal dryness    Normal gynecologic examination 09/05/2014   Vaccine counseling 05/21/2021    Past Surgical History:  Procedure Laterality Date   ABDOMINAL HYSTERECTOMY     APPENDECTOMY     CHOLECYSTECTOMY     TONSILLECTOMY      Social History   Socioeconomic History   Marital status: Married    Spouse name: Not on file   Number of children: Not on file   Years of education: Not on file   Highest education level: Not on file  Occupational History   Not on file  Tobacco Use   Smoking status: Never   Smokeless tobacco: Never  Vaping Use   Vaping Use: Never used  Substance and Sexual Activity   Alcohol use: Never   Drug use: Never   Sexual activity: Yes    Partners: Male    Comment: 1st intercourse- 43, partners- 63, married- 56 yrs,  Other Topics Concern   Not on file  Social History Narrative   Not on file   Social Determinants of Health   Financial Resource Strain: Low Risk  (11/20/2021)   Overall Financial Resource Strain (CARDIA)    Difficulty of Paying Living Expenses: Not hard at all  Food Insecurity: No Food Insecurity (11/20/2021)   Hunger Vital Sign    Worried About Running Out of Food in the Last Year: Never true    [place] in the Last Year: Never true  Transportation Needs: No Transportation Needs (11/20/2021)   PRAPARE -  Hydrologist (Medical): No    Lack of Transportation (Non-Medical): No  Physical Activity: Insufficiently Active (11/20/2021)   Exercise Vital Sign    Days of Exercise per Week: 1 day    Minutes of Exercise per Session: 40 min  Stress: No Stress Concern Present (11/20/2021)   [place]    Feeling of Stress : Not at all  Social Connections: Not on file    Family History  Problem Relation Age of Onset   Thyroid disease Mother    Cancer Mother    Pancreatic cancer Mother    Hypertension Father    Kidney disease Father    Thyroid cancer Sister        died at 50    Outpatient Encounter Medications as of 07/27/2022  Medication Sig   ACCU-CHEK GUIDE test strip USE 3 TIMES A DAY AS DIRECTED  Accu-Chek Softclix Lancets lancets Use as instructed to monitor glucose twice daily   acetaminophen (TYLENOL) 500 MG tablet Take 500 mg by mouth at bedtime as needed.   atorvastatin (LIPITOR) 40 MG tablet Take 1 tablet (40 mg total) by mouth daily.   Blood Glucose Monitoring Suppl (ACCU-CHEK GUIDE ME) w/Device KIT Use as directed to check blood glucose twice daily before breakfast and bed. DX E11.65   Insulin Pen Needle (BD PEN NEEDLE NANO U/F) 32G X 4 MM MISC 1 Units by Does not apply route 2 (two) times daily.   loratadine-pseudoephedrine (CLARITIN-D 12-HOUR) 5-120 MG tablet Take 1 tablet by mouth daily as needed.   losartan (COZAAR) 25 MG tablet TAKE 1 TABLET (25 MG TOTAL) BY MOUTH DAILY.   metFORMIN (GLUCOPHAGE-XR) 500 MG 24 hr tablet Take 1 tablet (500 mg total) by mouth every evening. Take one tablet each evening with dinner   SYNTHROID 112 MCG tablet TAKE 1 TABLET BY MOUTH EVERY DAY   traZODone (DESYREL) 50 MG tablet TAKE 1/2 TO 1 TABLET AT BEDTIME AS NEEDED FOR SLEEP   TRESIBA FLEXTOUCH 100 UNIT/ML FlexTouch Pen Inject 18 Units into the skin at bedtime.   TRULICITY 1.5 TG/5.4DI SOPN INJECT 1.5 MG INTO  THE SKIN ONCE A WEEK.   ALPRAZolam (XANAX) 0.25 MG tablet Take by mouth. (Patient not taking: Reported on 07/27/2022)   Melatonin 10 MG TABS Take 1 tablet by mouth at bedtime. (Patient not taking: Reported on 06/23/2022)   meloxicam (MOBIC) 7.5 MG tablet Take by mouth. (Patient not taking: Reported on 06/23/2022)   scopolamine (TRANSDERM-SCOP) 1 MG/3DAYS Place 1 patch (1.5 mg total) onto the skin every 3 (three) days. (Patient not taking: Reported on 06/23/2022)   No facility-administered encounter medications on file as of 07/27/2022.    ALLERGIES: Allergies  Allergen Reactions   Latex     Gloves, band aids- causes rash   Sulfa Antibiotics Other (See Comments)    VACCINATION STATUS: Immunization History  Administered Date(s) Administered   Fluad Quad(high Dose 65+) 09/10/2020, 08/14/2021   Influenza-Unspecified 08/09/2019   PFIZER Comirnaty(Gray Top)Covid-19 Tri-Sucrose Vaccine 05/28/2021   PFIZER(Purple Top)SARS-COV-2 Vaccination 12/31/2019, 01/02/2020, 08/25/2020   Pneumococcal Conjugate-13 11/24/2020   Pneumococcal Polysaccharide-23 01/19/2022    Diabetes She presents for her follow-up diabetic visit. She has type 2 diabetes mellitus. Onset time: She was diagnosed at approximate age of 71 years. Her disease course has been improving. There are no hypoglycemic associated symptoms. Pertinent negatives for hypoglycemia include no confusion, headaches, nervousness/anxiousness, pallor, seizures or tremors. Associated symptoms include weight loss. Pertinent negatives for diabetes include no chest pain, no fatigue, no polydipsia, no polyphagia and no polyuria. There are no hypoglycemic complications. Symptoms are stable. Diabetic complications include nephropathy and retinopathy. Risk factors for coronary artery disease include diabetes mellitus, dyslipidemia, hypertension, post-menopausal, sedentary lifestyle and obesity. Current diabetic treatment includes insulin injections and oral agent  (monotherapy) (and Trulicity). She is compliant with treatment most of the time. Her weight is fluctuating minimally. She is following a generally healthy diet. When asked about meal planning, she reported none. She has not had a previous visit with a dietitian. She participates in exercise intermittently. Her home blood glucose trend is decreasing steadily. Her breakfast blood glucose range is generally 130-140 mg/dl. Her lunch blood glucose range is generally 110-130 mg/dl. Her dinner blood glucose range is generally 110-130 mg/dl. Her bedtime blood glucose range is generally 140-180 mg/dl. (She presents today with her logs and meter, showing slightly above target fasting and  at goal postprandial readings.  Her POCT A1c today is 7.3%, improving from last visit of 8.2%.  She has worked hard on diet and exercise.  She denies any significant hypoglycemia.  Analysis of her meter shows 7-day average of 142, 14-day average of 150, 30-day average of 154, 90-day average of 156.) An ACE inhibitor/angiotensin II receptor blocker is being taken. She does not see a podiatrist.Eye exam is current.  Hyperlipidemia This is a chronic problem. The current episode started more than 1 year ago. The problem is controlled. Recent lipid tests were reviewed and are normal. Exacerbating diseases include diabetes. There are no known factors aggravating her hyperlipidemia. Pertinent negatives include no chest pain, myalgias or shortness of breath. Current antihyperlipidemic treatment includes statins. The current treatment provides moderate improvement of lipids. There are no compliance problems.  Risk factors for coronary artery disease include diabetes mellitus, dyslipidemia, hypertension, a sedentary lifestyle and post-menopausal.  Thyroid Problem Presents for follow-up visit. Onset time: She was diagnosed with hypothyroidism in her 93s, currently taking Synthroid 112 mcg p.o. daily. Symptoms include weight loss. Patient reports no  anxiety, cold intolerance, constipation, depressed mood, diarrhea, fatigue, heat intolerance, leg swelling, palpitations, tremors or weight gain. The symptoms have been stable. Her past medical history is significant for diabetes and hyperlipidemia.  Hypertension This is a chronic problem. The current episode started more than 1 year ago. The problem has been resolved since onset. The problem is controlled. Pertinent negatives include no chest pain, headaches, palpitations or shortness of breath. Agents associated with hypertension include thyroid hormones. Risk factors for coronary artery disease include diabetes mellitus, dyslipidemia, family history, post-menopausal state and sedentary lifestyle. Past treatments include angiotensin blockers. The current treatment provides moderate improvement. There are no compliance problems.  Hypertensive end-organ damage includes retinopathy. Identifiable causes of hypertension include a thyroid problem.    Review of systems  Constitutional: + decreasing body weight,  current Body mass index is 29.4 kg/m. , no fatigue, no subjective hypothermia Eyes: no blurry vision, no xerophthalmia ENT: no sore throat, no nodules palpated in throat, no dysphagia/odynophagia, no hoarseness Cardiovascular: no chest pain, no shortness of breath, no palpitations, no leg swelling Respiratory: no cough, no shortness of breath Gastrointestinal: no nausea/vomiting/diarrhea Musculoskeletal: no muscle/joint aches Skin: no rashes, no hyperemia Neurological: no tremors, no numbness, no tingling, no dizziness Psychiatric: no depression, no anxiety   Objective:    BP 110/70 (BP Location: Left Arm, Patient Position: Sitting, Cuff Size: Normal)   Pulse 88   Ht 5' 3.5" (1.613 m)   Wt 168 lb 9.6 oz (76.5 kg)   BMI 29.40 kg/m   Wt Readings from Last 3 Encounters:  07/27/22 168 lb 9.6 oz (76.5 kg)  06/23/22 171 lb (77.6 kg)  04/22/22 172 lb (78 kg)    BP Readings from Last 3  Encounters:  07/27/22 110/70  06/23/22 120/70  04/22/22 126/72      Physical Exam- Limited  Constitutional:  Body mass index is 29.4 kg/m. , not in acute distress, normal state of mind Eyes:  EOMI, no exophthalmos Neck: Supple Cardiovascular: RRR, no murmurs, rubs, or gallops, no edema Respiratory: Adequate breathing efforts, no crackles, rales, rhonchi, or wheezing Musculoskeletal: no gross deformities, strength intact in all four extremities, no gross restriction of joint movements Skin:  no rashes, no hyperemia Neurological: no tremor with outstretched hands     No results found for this or any previous visit (from the past 2160 hour(s)).     Assessment &  Plan:   1) Uncontrolled type 2 diabetes mellitus with hyperglycemia ([place])  - Courtney Barry has currently controlled asymptomatic type 2 DM since  71 years of age.  She presents today with her logs and meter, showing slightly above target fasting and at goal postprandial readings.  Her POCT A1c today is 7.3%, improving from last visit of 8.2%.  She has worked hard on diet and exercise.  She denies any significant hypoglycemia.  Analysis of her meter shows 7-day average of 142, 14-day average of 150, 30-day average of 154, 90-day average of 156.  -her diabetes is complicated by retinopathy obesity/sedentary life and she remains at a high risk for more acute and chronic complications which include CAD, CVA, CKD, retinopathy, and neuropathy. These are all discussed in detail with her.  - Nutritional counseling repeated at each appointment due to patients tendency to fall back in to old habits.  - The patient admits there is a room for improvement in their diet and drink choices. -  Suggestion is made for the patient to avoid simple carbohydrates from their diet including Cakes, Sweet Desserts / Pastries, Ice Cream, Soda (diet and regular), Sweet Tea, Candies, Chips, Cookies, Sweet Pastries, Store Bought Juices, Alcohol  in Excess of 1-2 drinks a day, Artificial Sweeteners, Coffee Creamer, and "Sugar-free" Products. This will help patient to have stable blood glucose profile and potentially avoid unintended weight gain.   - I encouraged the patient to switch to unprocessed or minimally processed complex starch and increased protein intake (animal or plant source), fruits, and vegetables.   - Patient is advised to stick to a routine mealtimes to eat 3 meals a day and avoid unnecessary snacks (to snack only to correct hypoglycemia).  - I have approached her with the following individualized plan to manage  her diabetes and patient agrees:   -She will continue to benefit from simplified treatment regimen, given her tight control of diabetes.    -She is advised to increase her Tyler Aas slightly to 20 units SQ nightly and continue her Metformin 500 mg ER daily at supper (due to GI effects) and continue Trulicity 1.5 mg SQ weekly.   I did tell her she could try stopping the Metformin for a week or so to see if her diarrhea improved.  -She is encouraged to routinely fingerstick twice daily, before breakfast and before bed, and to call the clinic if she has readings less than 70 or above 300 for 3 tests in a row.  She did not tolerate CGM (adhesive allergy).  - She has allergy to sulfa medications, therefore not a candidate for glipizide.  - Specific targets for  A1c;  LDL, HDL, Triglycerides,  were discussed with the patient.  2) Blood Pressure /Hypertension:  Her blood pressure is controlled to target.  She is advised to continue Losartan 25 mg po daily.  3) Lipids/Hyperlipidemia:  Her most recent lipid panel from 04/13/22 shows controlled LDL at 50.  She is advised to continue Lipitor 40 mg po daily at bedtime.  Side effects and precautions discussed with her.    4) Hypothyroidism- longstanding diagnosis since at least from her 54s.   There are no recent TFTs to review.  She is advised to continue Synthroid 112  mcg po daily before breakfast.   - We discussed about the correct intake of her thyroid hormone, on empty stomach at fasting, with water, separated by at least 30 minutes from breakfast and other medications,  and separated by more  than 4 hours from calcium, iron, multivitamins, acid reflux medications (PPIs). -Patient is made aware of the fact that thyroid hormone replacement is needed for life, dose to be adjusted by periodic monitoring of thyroid function tests.  5)  Weight/Diet:  Her Body mass index is 29.4 kg/m.-   she is a candidate for some weight loss. I discussed with her the fact that loss of 5 - 10% of her  current body weight will have the most impact on her diabetes management.  Exercise, and detailed carbohydrates information provided  -  detailed on discharge instructions.  6) Chronic Care/Health Maintenance: -she is on ACEI/ARB and Statin medications and is encouraged to initiate and continue to follow up with Ophthalmology, Dentist, Podiatrist at least yearly or according to recommendations, and advised to stay away from smoking. I have recommended yearly flu vaccine and pneumonia vaccine at least every 5 years; moderate intensity exercise for up to 150 minutes weekly; and sleep for at least 7 hours a day.  - she is advised to maintain close follow up with [facility], Camelia Eng, PA-C for primary care needs, as well as her other providers for optimal and coordinated care.     I spent 40 minutes in the care of the patient today including review of labs from [facility], Lipids, Thyroid Function, Hematology (current and previous including abstractions from other facilities); face-to-face time discussing  her blood glucose readings/logs, discussing hypoglycemia and hyperglycemia episodes and symptoms, medications doses, her options of short and long term treatment based on the latest standards of care / guidelines;  discussion about incorporating lifestyle medicine;  and documenting the encounter.  Risk reduction counseling performed per USPSTF guidelines to reduce obesity and cardiovascular risk factors.     Please refer to Patient Instructions for Blood Glucose Monitoring and Insulin/Medications Dosing Guide"  in media tab for additional information. Please  also refer to " Patient Self Inventory" in the Media  tab for reviewed elements of pertinent patient history.  Courtney Barry participated in the discussions, expressed understanding, and voiced agreement with the above plans.  All questions were answered to her satisfaction. she is encouraged to contact clinic should she have any questions or concerns prior to her return visit.    Follow up plan: - Return in about 4 months (around 11/26/2022) for Diabetes F/U with A1c in office, Thyroid follow up, Previsit labs, Bring meter and logs.   Rayetta Pigg, [facility] [address] [place], [place] [address] Phone: [phone] Fax: [phone]  07/27/2022, 11:39 AM

## 2022-07-30 LAB — HEMOGLOBIN A1C: Hemoglobin A1C: 7.3

## 2022-08-02 ENCOUNTER — Other Ambulatory Visit: Payer: Self-pay

## 2022-08-02 MED ORDER — TRULICITY 1.5 MG/0.5ML ~~LOC~~ SOAJ: 1.5000 mg | SUBCUTANEOUS | 2 refills | Status: DC

## 2022-08-03 ENCOUNTER — Ambulatory Visit: Payer: Medicare PPO | Admitting: Medical

## 2022-08-03 VITALS — BP 120/70 | HR 97 | Wt 170.2 lb

## 2022-08-03 DIAGNOSIS — E1165 Type 2 diabetes mellitus with hyperglycemia: Secondary | ICD-10-CM | POA: Diagnosis not present

## 2022-08-03 DIAGNOSIS — E785 Hyperlipidemia, unspecified: Secondary | ICD-10-CM

## 2022-08-03 DIAGNOSIS — I10 Essential (primary) hypertension: Secondary | ICD-10-CM

## 2022-08-03 DIAGNOSIS — I739 Peripheral vascular disease, unspecified: Secondary | ICD-10-CM | POA: Diagnosis not present

## 2022-08-03 DIAGNOSIS — E1169 Type 2 diabetes mellitus with other specified complication: Secondary | ICD-10-CM

## 2022-08-03 DIAGNOSIS — Z7185 Encounter for immunization safety counseling: Secondary | ICD-10-CM

## 2022-08-03 DIAGNOSIS — Z23 Encounter for immunization: Secondary | ICD-10-CM

## 2022-08-03 DIAGNOSIS — E039 Hypothyroidism, unspecified: Secondary | ICD-10-CM

## 2022-08-03 DIAGNOSIS — F5101 Primary insomnia: Secondary | ICD-10-CM

## 2022-08-03 MED ORDER — ATORVASTATIN CALCIUM 40 MG PO TABS: 40.0000 mg | ORAL_TABLET | Freq: Every day | ORAL | 3 refills | Status: DC

## 2022-08-03 MED ORDER — LOSARTAN POTASSIUM 25 MG PO TABS: 25.0000 mg | ORAL_TABLET | Freq: Every day | ORAL | 3 refills | Status: DC

## 2022-08-03 NOTE — Patient Instructions (Signed)
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  Talk to them soon about whether you continue metformin and a different dose for now given the recent diarrhea Continue Tresiba and Trulicity  High cholesterol Continue Lipitor atorvastatin 40 mg daily Your most recent cholesterol labs in May 2023 was at goal  Hypothyroidism Continue routine follow-up with endocrinology Continue Synthroid 112 mcg daily  Insomnia Doing fine on trazodone  Vascular disease You report no issues with pains in extremities today Continue routine exercise  Vaccines: Counseled on the influenza virus vaccine.  Vaccine information sheet given.   High dose Influenza vaccine given after consent obtained.  Get your Tetanus and Shingles vaccines at your local pharamcy soon.  Make sure they send [place] documentation

## 2022-08-03 NOTE — Progress Notes (Signed)
Subjective:  Courtney Barry is a 71 y.o. female who presents for Chief Complaint  Patient presents with   med check    Med check. Flu shot today     Medical Team: Rayetta Pigg, NP endocrinologist and dietician Dr. Princess Bruins, gynecology Dr. Eunice Blase, orthopedics Eye doctor, Dr. Katy Fitch dentist   Concerns: Diabetes and hypothyroidism-managed by endocrinology, compliant with medications.  She saw them in May and has follow-up in January 2024.  High cholesterol-compliant with statin without complaint  Hypertension-compliant with losartan without complaint, no chest pain, palpitations, no edema  Hypothyroidism-no concerns, compliant with medication  Her only concern today is diarrhea.  She notes for months she has had constant diarrhea 4-5 times per day.  She is dealt with this recently, change endocrinologist and they had her stop her metformin.  So far she is seeing improvements.  She just stopped it within the last week.  No blood in the stool, no recent travel, no recent animal exposure, no recent antibiotic use  Otherwise normal state of health  No other aggravating or relieving factors.    No other c/o.  Past Medical History:  Diagnosis Date   Diabetes mellitus without complication ([place]) 01/16/9178   Diabetes mellitus, type II ([place])    Encounter for specialized medical examination 09/04/2013   Hyperlipidemia    Hypothyroidism    Menopausal vaginal dryness    Normal gynecologic examination 09/05/2014   Vaccine counseling 05/21/2021   Current Outpatient Medications on File Prior to Visit  Medication Sig Dispense Refill   ACCU-CHEK GUIDE test strip USE 3 TIMES A DAY AS DIRECTED 100 strip 2   Accu-Chek Softclix Lancets lancets Use as instructed to monitor glucose twice daily 100 each 12   acetaminophen (TYLENOL) 500 MG tablet Take 500 mg by mouth at bedtime as needed.     atorvastatin (LIPITOR) 40 MG tablet Take 1 tablet (40 mg total) by mouth daily. 90  tablet 1   Blood Glucose Monitoring Suppl (ACCU-CHEK GUIDE ME) w/Device KIT Use as directed to check blood glucose twice daily before breakfast and bed. DX E11.65 1 kit 0   Dulaglutide (TRULICITY) 1.5 XT/0.5WP SOPN Inject 1.5 mg into the skin once a week. 2 mL 2   Insulin Pen Needle (BD PEN NEEDLE NANO U/F) 32G X 4 MM MISC 1 Units by Does not apply route 2 (two) times daily. 100 each 3   loratadine-pseudoephedrine (CLARITIN-D 12-HOUR) 5-120 MG tablet Take 1 tablet by mouth daily as needed.     losartan (COZAAR) 25 MG tablet TAKE 1 TABLET (25 MG TOTAL) BY MOUTH DAILY. 90 tablet 1   metFORMIN (GLUCOPHAGE-XR) 500 MG 24 hr tablet Take 1 tablet (500 mg total) by mouth every evening. Take one tablet each evening with dinner 90 tablet 3   SYNTHROID 112 MCG tablet TAKE 1 TABLET BY MOUTH EVERY DAY 90 tablet 1   traZODone (DESYREL) 50 MG tablet TAKE 1/2 TO 1 TABLET AT BEDTIME AS NEEDED FOR SLEEP 90 tablet 0   TRESIBA FLEXTOUCH 100 UNIT/ML FlexTouch Pen Inject 18 Units into the skin at bedtime. (Patient taking differently: Inject 20 Units into the skin at bedtime.) 30 mL 2   No current facility-administered medications on file prior to visit.     The following portions of the patient's history were reviewed and updated as appropriate: allergies, current medications, past family history, past medical history, past social history, past surgical history and problem list.  ROS Otherwise as in subjective above  Objective:  BP 120/70   Pulse 97   Wt 170 lb 3.2 oz (77.2 kg)   BMI 29.68 kg/m   General appearance: alert, no distress, well developed, well nourished Neck: supple, no lymphadenopathy, no thyromegaly, no masses Heart: RRR, normal S1, S2, no murmurs Lungs: CTA bilaterally, no wheezes, rhonchi, or rales Abdomen: +bs, soft, non tender, non distended, no masses, no hepatomegaly, no splenomegaly Pulses: 2+ radial pulses, 2+ pedal pulses, normal cap refill Ext: no edema  Diabetic Foot Exam -  Simple   Simple Foot Form Diabetic Foot exam was performed with the following findings: Yes 08/03/2022  9:50 AM  Visual Inspection No deformities, no ulcerations, no other skin breakdown bilaterally: Yes Sensation Testing Intact to touch and monofilament testing bilaterally: Yes Pulse Check Posterior Tibialis and Dorsalis pulse intact bilaterally: Yes Comments       Assessment: Encounter Diagnoses  Name Primary?   Essential hypertension, benign Yes   Needs flu shot    Hyperlipidemia associated with type 2 diabetes mellitus ([place])    Hypothyroidism, unspecified type    Primary insomnia    PVD (peripheral vascular disease) ([place])    Uncontrolled type 2 diabetes mellitus with hyperglycemia ([place])    Vaccine counseling      Plan: Diarrhea, chronic likely due to metformin Talk to your endocrinologist soon about how they want to switch her dose or what changes they want to make in light of the metformin If your diarrhea does not completely resolve in the next 2 weeks then let me know and we can make a referral back to gastroenterology  Hypertension  Continue losartan 25 mg daily Limit salt Get exercise regularly at least 150 minutes/week  Diabetes continue routine follow-up with your diabetes specialist.  Talk to them soon about whether you continue metformin and a different dose for now given the recent diarrhea Continue Tresiba and Trulicity  High cholesterol Continue Lipitor atorvastatin 40 mg daily Your most recent cholesterol labs in May 2023 was at goal  Hypothyroidism Continue routine follow-up with endocrinology Continue Synthroid 112 mcg daily  Insomnia Doing fine on trazodone  Vascular disease You report no issues with pains in extremities today Continue routine exercise  Vaccines: Counseled on the influenza virus vaccine.  Vaccine information sheet given.   High dose Influenza vaccine given after consent obtained.  Get your Tetanus and Shingles vaccines  at your local pharamcy soon.  Make sure they send [place] documentation   Courtney Barry was seen today for med check.  Diagnoses and all orders for this visit:  Essential hypertension, benign  Needs flu shot -     Flu Vaccine QUAD High Dose(Fluad)  Hyperlipidemia associated with type 2 diabetes mellitus ([facility])  Hypothyroidism, unspecified type  Primary insomnia  PVD (peripheral vascular disease) ([place])  Uncontrolled type 2 diabetes mellitus with hyperglycemia ([place])  Vaccine counseling    Follow up: yearly for CPX

## 2022-08-03 NOTE — Addendum Note (Signed)
Addended by: Carlena Hurl on: 08/03/2022 10:17 AM   Modules accepted: Orders

## 2022-08-04 ENCOUNTER — Other Ambulatory Visit: Payer: Self-pay | Admitting: Nurse Practitioner

## 2022-08-04 MED ORDER — TRULICITY 1.5 MG/0.5ML ~~LOC~~ SOAJ: 1.5000 mg | SUBCUTANEOUS | 1 refills | Status: DC

## 2022-08-16 ENCOUNTER — Encounter: Payer: Self-pay | Admitting: Nurse Practitioner

## 2022-08-19 ENCOUNTER — Other Ambulatory Visit: Payer: Self-pay

## 2022-08-19 MED ORDER — ESTRADIOL 0.1 MG/GM VA CREA: TOPICAL_CREAM | VAGINAL | 0 refills | Status: DC

## 2022-08-19 NOTE — Telephone Encounter (Signed)
AEX 10/13/2021. Scheduled 10/16/2022.  MMG  Negative 01/21/22.  Patient needs refill on Estradiol 0.1 mg Vag Cream   I saw where Dr. Marguerita Merles did not prescribe it last year but patient said she uses it sparingly and the tube she currently using was filled in 2022.  She said she has been using it all along.

## 2022-08-24 ENCOUNTER — Encounter: Payer: Self-pay | Admitting: Internal Medicine

## 2022-08-27 ENCOUNTER — Other Ambulatory Visit: Payer: Self-pay | Admitting: Medical

## 2022-08-27 DIAGNOSIS — F5101 Primary insomnia: Secondary | ICD-10-CM

## 2022-09-25 IMAGING — MG MM DIGITAL SCREENING BILAT W/ TOMO AND CAD
8 series · 8 of 24 positions shown · non-contrast
Comparison: Previous exam(s).

CLINICAL DATA: Screening.

EXAM:
DIGITAL SCREENING BILATERAL MAMMOGRAM WITH TOMOSYNTHESIS AND CAD
TECHNIQUE: Bilateral screening digital craniocaudal and mediolateral oblique
mammograms were obtained. Bilateral screening digital breast
tomosynthesis was performed. The images were evaluated with
computer-aided detection.

[R MLO synth-2D]
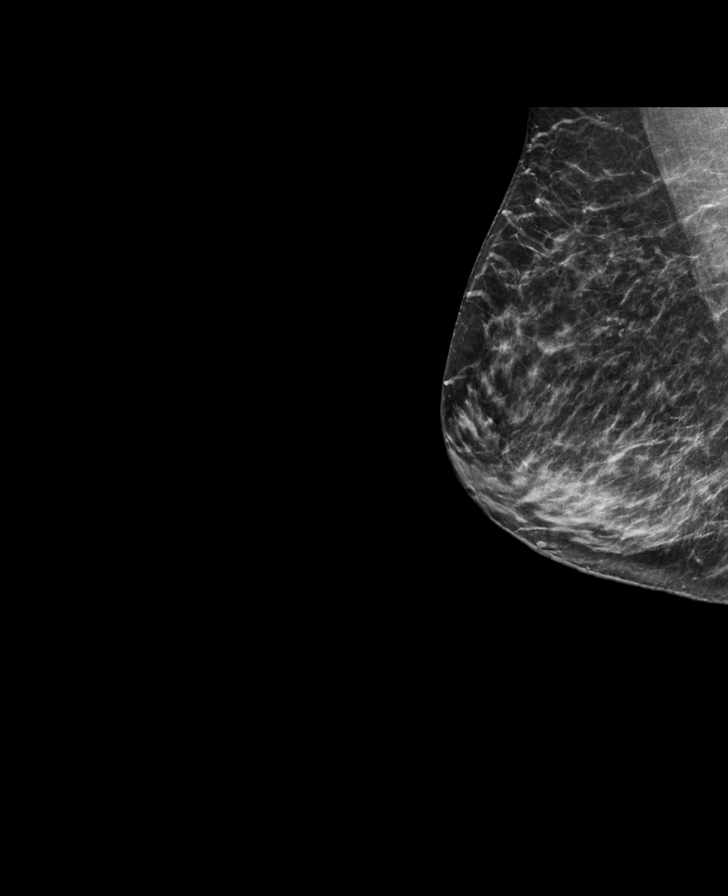

[R CC synth-2D]
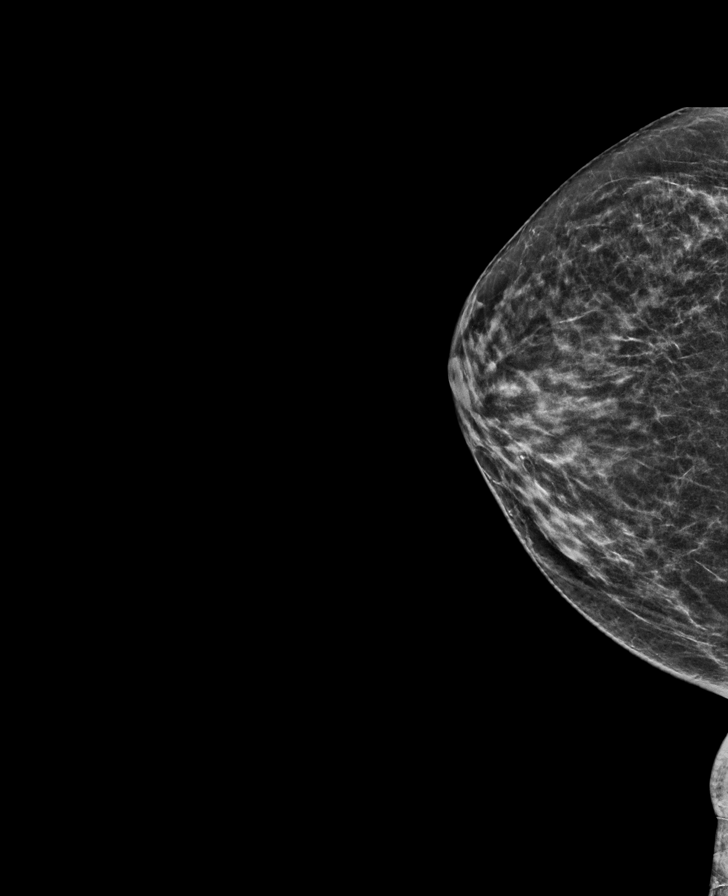

[L MLO synth-2D]
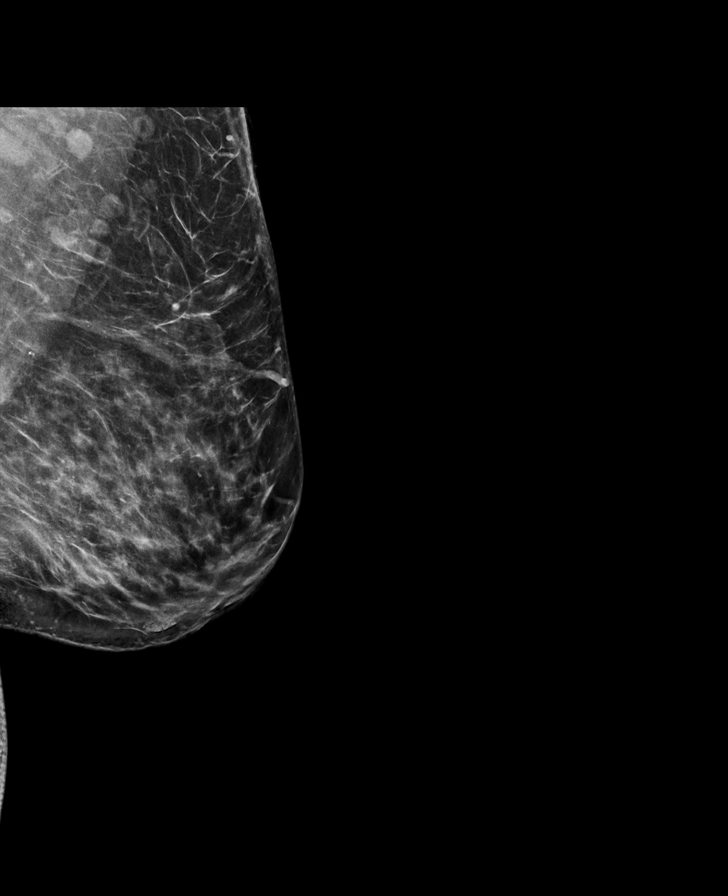

[L CC synth-2D]
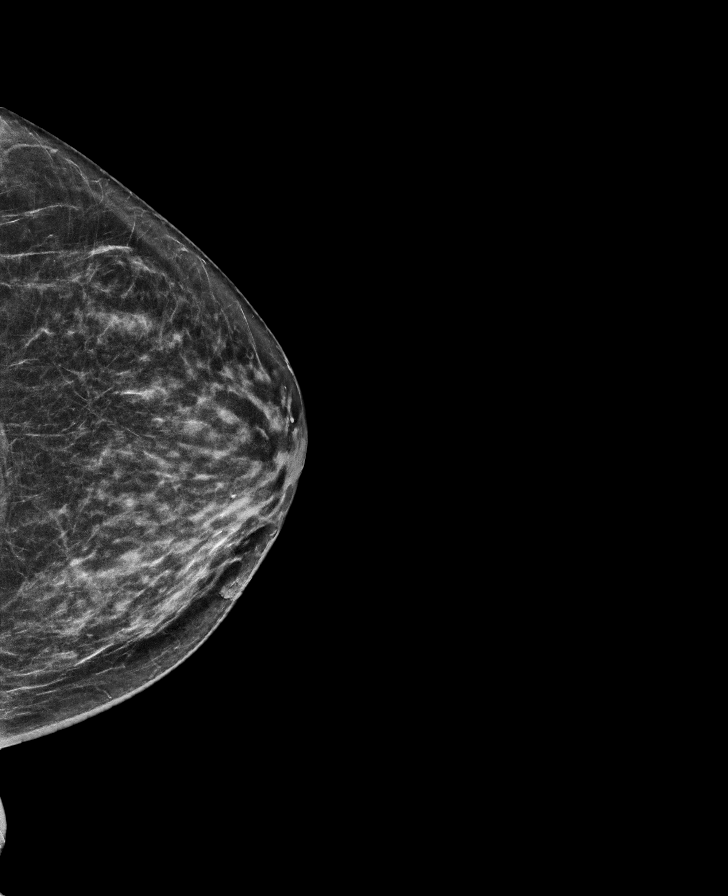

[L MLO tomo · tomo slice 41/81.0]
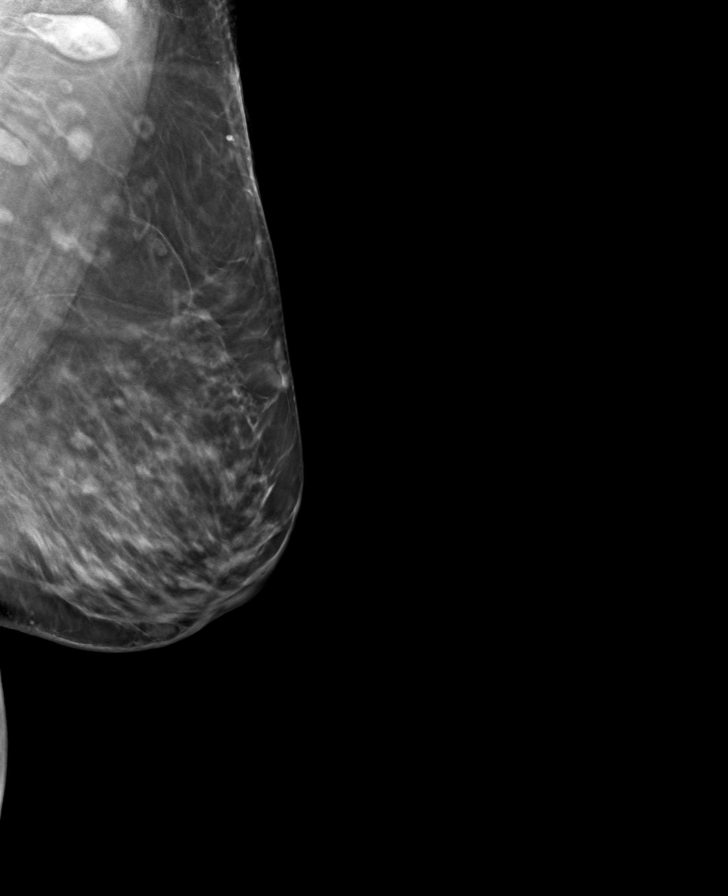

[L CC tomo · tomo slice 31/60.0]
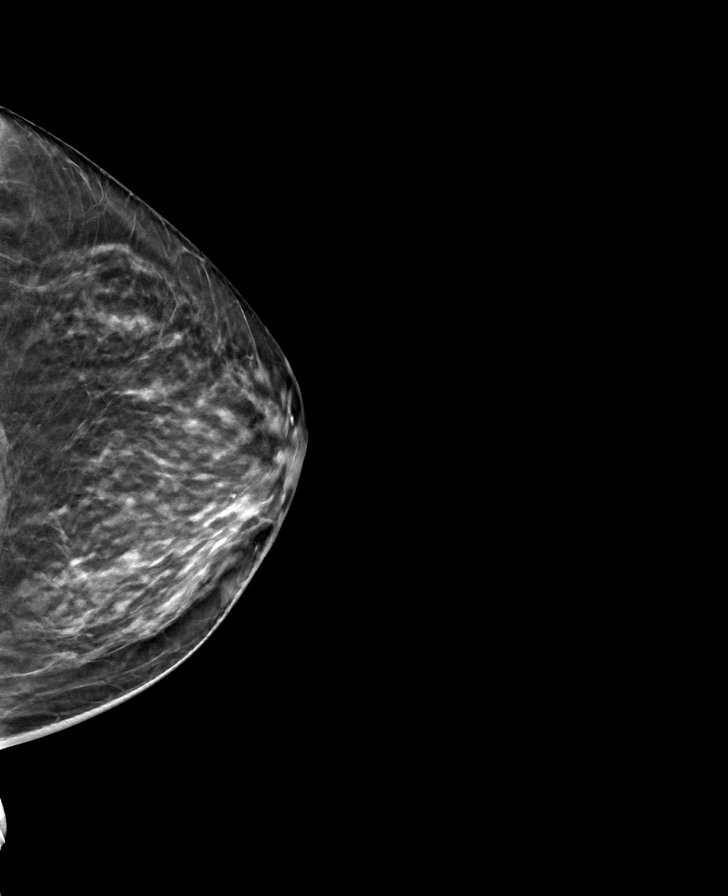

[R CC tomo · tomo slice 27/53.0]
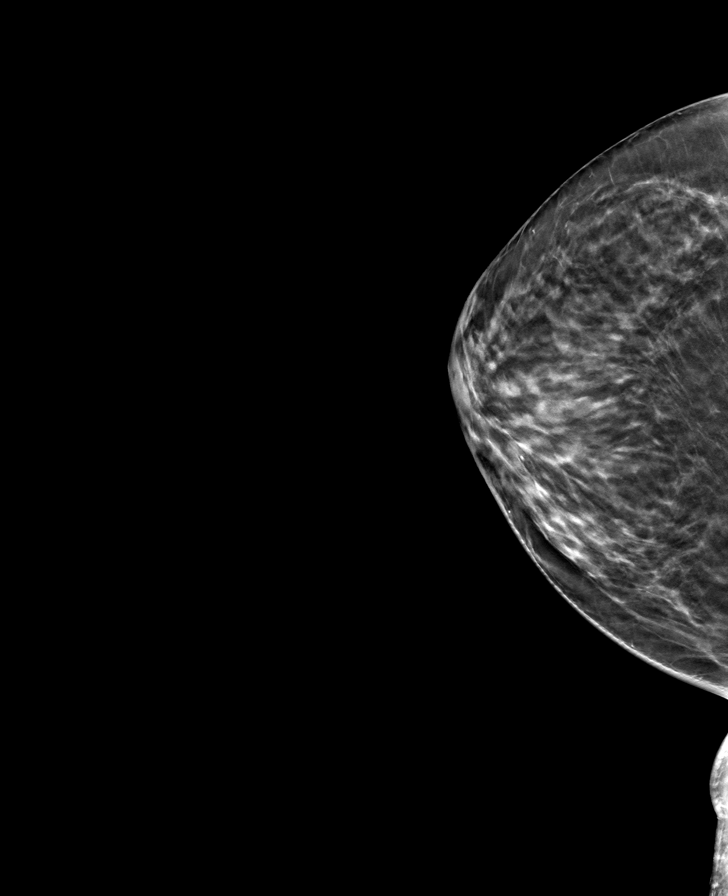

[R MLO tomo · tomo slice 32/63.0]
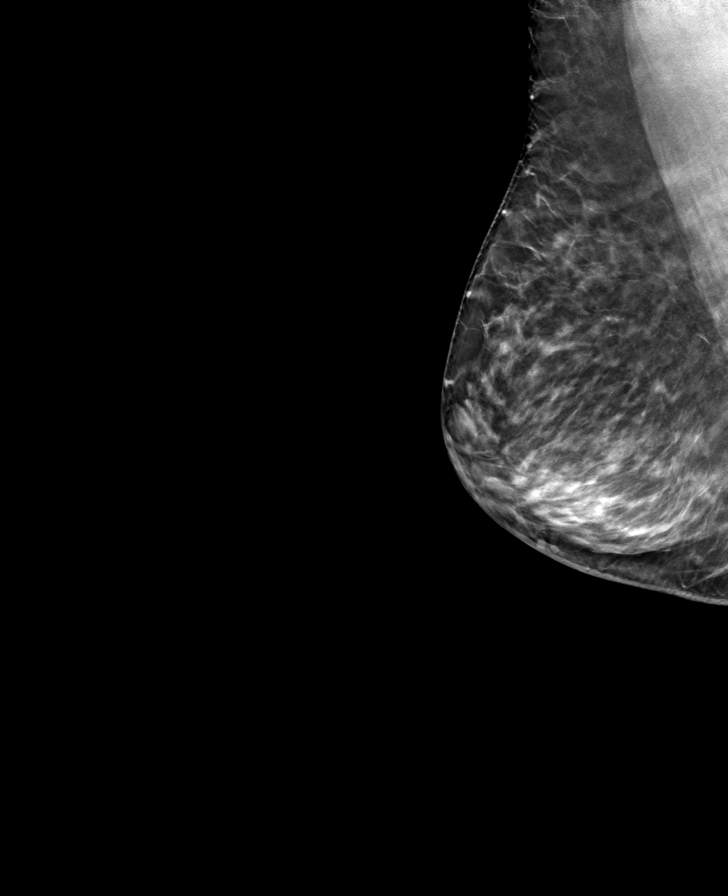

[8 of 24 positions shown; findings below may reference images not displayed]

ACR Breast Density Category c: The breast tissue is heterogeneously
dense, which may obscure small masses.
FINDINGS: There are no findings suspicious for malignancy. The images were
evaluated with computer-aided detection.
IMPRESSION: No mammographic evidence of malignancy. A result letter of this
screening mammogram will be mailed directly to the patient.

RECOMMENDATION:
Screening mammogram in one year. (Code:T4-5-GWO)

BI-RADS CATEGORY  1: Negative.

## 2022-09-26 ENCOUNTER — Other Ambulatory Visit: Payer: Self-pay | Admitting: "Endocrinology

## 2022-10-13 ENCOUNTER — Other Ambulatory Visit: Payer: Self-pay | Admitting: Medical

## 2022-10-13 DIAGNOSIS — F5101 Primary insomnia: Secondary | ICD-10-CM

## 2022-10-15 ENCOUNTER — Ambulatory Visit: Payer: Medicare PPO | Admitting: Obstetrics & Gynecology

## 2022-10-21 ENCOUNTER — Other Ambulatory Visit: Payer: Self-pay | Admitting: Nurse Practitioner

## 2022-10-21 NOTE — Telephone Encounter (Signed)
Insurance will not cover, they are requesting an alternative.

## 2022-11-07 ENCOUNTER — Other Ambulatory Visit: Payer: Self-pay | Admitting: "Endocrinology

## 2022-11-22 DIAGNOSIS — E1165 Type 2 diabetes mellitus with hyperglycemia: Secondary | ICD-10-CM | POA: Diagnosis not present

## 2022-11-22 DIAGNOSIS — E039 Hypothyroidism, unspecified: Secondary | ICD-10-CM | POA: Diagnosis not present

## 2022-11-23 ENCOUNTER — Ambulatory Visit: Payer: Medicare PPO

## 2022-11-23 VITALS — Ht 64.0 in | Wt 169.0 lb

## 2022-11-23 DIAGNOSIS — Z0000 Encounter for general adult medical examination without abnormal findings: Secondary | ICD-10-CM

## 2022-11-23 LAB — COMPREHENSIVE METABOLIC PANEL
ALT: 30 IU/L (ref 0–32)
AST: 17 IU/L (ref 0–40)
Albumin/Globulin Ratio: 1.6 (ref 1.2–2.2)
Albumin: 4.2 g/dL (ref 3.9–4.9)
Alkaline Phosphatase: 81 IU/L (ref 44–121)
BUN/Creatinine Ratio: 18 (ref 12–28)
BUN: 15 mg/dL (ref 8–27)
Bilirubin Total: 0.9 mg/dL (ref 0.0–1.2)
CO2: 21 mmol/L (ref 20–29)
Calcium: 9.4 mg/dL (ref 8.7–10.3)
Chloride: 105 mmol/L (ref 96–106)
Creatinine, Ser: 0.85 mg/dL (ref 0.57–1.00)
Globulin, Total: 2.6 g/dL (ref 1.5–4.5)
Glucose: 155 mg/dL — ABNORMAL HIGH (ref 70–99)
Potassium: 4.2 mmol/L (ref 3.5–5.2)
Sodium: 142 mmol/L (ref 134–144)
Total Protein: 6.8 g/dL (ref 6.0–8.5)
eGFR: 74 mL/min/{1.73_m2} (ref >59)

## 2022-11-23 LAB — T4, FREE: Free T4: 1.59 ng/dL (ref 0.82–1.77)

## 2022-11-23 LAB — TSH: TSH: 0.437 u[IU]/mL — ABNORMAL LOW (ref 0.450–4.500)

## 2022-11-23 NOTE — Progress Notes (Signed)
I connected with  Courtney Barry today via telehealth video enabled device and verified that I am speaking with the correct person using two identifiers.   Location: Patient: home Provider: work  Persons participating in virtual visit: Courtney Barry, Courtney Durand LPN  I discussed the limitations, risks, security and privacy concerns of performing an evaluation and management service by video and the availability of in person appointments. The patient expressed understanding and agreed to proceed.   Some vital signs may be absent or patient reported.      Subjective:   Courtney Barry is a 72 y.o. female who presents for Medicare Annual (Subsequent) preventive examination.  Review of Systems     Cardiac Risk Factors include: advanced age (>39mn, >>85women);diabetes mellitus;dyslipidemia;hypertension     Objective:    Today's Vitals   11/23/22 1001  Weight: 169 lb (76.7 kg)  Height: '5\' 4"'$  (1.626 m)   Body mass index is 29.01 kg/m.     11/23/2022   10:09 AM 11/20/2021   10:36 AM 09/30/2021   10:09 AM  Advanced Directives  Does Patient Have a Medical Advance Directive? Yes Yes No  Type of Advance Directive Out of facility DNR (pink MOST or yellow form) Out of facility DNR (pink MOST or yellow form)   Would patient like information on creating a medical advance directive?   No - Patient declined    Current Medications (verified) Outpatient Encounter Medications as of 11/23/2022  Medication Sig   ACCU-CHEK GUIDE test strip USE 3 TIMES A DAY AS DIRECTED   Accu-Chek Softclix Lancets lancets Use as instructed to monitor glucose twice daily   acetaminophen (TYLENOL) 500 MG tablet Take 500 mg by mouth at bedtime as needed.   atorvastatin (LIPITOR) 40 MG tablet Take 1 tablet (40 mg total) by mouth daily.   Blood Glucose Monitoring Suppl (ACCU-CHEK GUIDE ME) w/Device KIT Use as directed to check blood glucose twice daily before breakfast and bed. DX E11.65   estradiol  (ESTRACE) 0.1 MG/GM vaginal cream Place 0.25 mg vaginally twice weekly.   Insulin Pen Needle (BD PEN NEEDLE NANO U/F) 32G X 4 MM MISC 1 Units by Does not apply route 2 (two) times daily.   loratadine-pseudoephedrine (CLARITIN-D 12-HOUR) 5-120 MG tablet Take 1 tablet by mouth daily as needed.   losartan (COZAAR) 25 MG tablet Take 1 tablet (25 mg total) by mouth daily.   Semaglutide, 1 MG/DOSE, 4 MG/3ML SOPN Inject 1 mg as directed once a week.   SYNTHROID 112 MCG tablet TAKE 1 TABLET BY MOUTH EVERY DAY   traZODone (DESYREL) 50 MG tablet TAKE 1/2 TO 1 TABLET BY MOUTH AT BEDTIME AS NEEDED FOR SLEEP   TRESIBA FLEXTOUCH 100 UNIT/ML FlexTouch Pen Inject 18 Units into the skin at bedtime. (Patient taking differently: Inject 20 Units into the skin at bedtime.)   metFORMIN (GLUCOPHAGE-XR) 500 MG 24 hr tablet Take 1 tablet (500 mg total) by mouth every evening. Take one tablet each evening with dinner   No facility-administered encounter medications on file as of 11/23/2022.    Allergies (verified) Latex and Sulfa antibiotics   History: Past Medical History:  Diagnosis Date   Diabetes mellitus [place] complication ([place] 08/17/4741  Diabetes [place], type II ([place]    Encounter for specialized medical examination 09/04/2013   Hyperlipidemia    Hypothyroidism    Menopausal vaginal dryness    Normal gynecologic examination 09/05/2014   Vaccine counseling 05/21/2021   Past Surgical History:  Procedure Laterality  Date   ABDOMINAL HYSTERECTOMY     APPENDECTOMY     CHOLECYSTECTOMY     TONSILLECTOMY     Family History  Problem Relation Age of Onset   Thyroid disease Mother    Cancer Mother    Pancreatic cancer Mother    Hypertension Father    Kidney disease Father    Thyroid cancer Sister        died at 68   Social History   Socioeconomic History   Marital status: Married    Spouse name: Not on file   Number of children: Not on file   Years of education: Not on file   Highest education  level: Not on file  Occupational History   Not on file  Tobacco Use   Smoking status: Never   Smokeless tobacco: Never  Vaping Use   Vaping Use: Never used  Substance and Sexual Activity   Alcohol use: Never   Drug use: Never   Sexual activity: Yes    Partners: Male    Comment: 1st intercourse- 77, partners- 39, married- 42 yrs,  Other Topics Concern   Not on file  Social History Narrative   Not on file   Social Determinants of Health   Financial Resource Strain: Low Risk  (11/23/2022)   Overall Financial Resource Strain (CARDIA)    Difficulty of Paying Living Expenses: Not hard at all  Food Insecurity: No Food Insecurity (11/23/2022)   Hunger Vital Sign    Worried About Running Out of Food in the Last Year: Never true    Free Soil in the Last Year: Never true  Transportation Needs: No Transportation Needs (11/23/2022)   PRAPARE - Hydrologist (Medical): No    Lack of Transportation (Non-Medical): No  Physical Activity: Inactive (11/23/2022)   Exercise Vital Sign    Days of Exercise per Week: 0 days    Minutes of Exercise per Session: 0 min  Stress: No Stress Concern Present (11/23/2022)   [place]    Feeling of Stress : Not at all  Social Connections: Not on file    Tobacco Counseling Counseling given: Not Answered   Clinical Intake:  Pre-visit preparation completed: Yes  Pain : No/denies pain     Nutritional Status: BMI 25 -29 Overweight Nutritional Risks: None Diabetes: Yes  How often do you need to have someone help you when you read instructions, pamphlets, or other written materials from your doctor or pharmacy?: 1 - Never  Diabetic?yes Nutrition Risk Assessment:  Has the patient had any N/V/D within the last 2 months?  No  Does the patient have any non-healing wounds?  No  Has the patient had any unintentional weight loss or weight gain?  No    Diabetes:  Is the patient diabetic?  Yes  If diabetic, was a CBG obtained today?  No  Did the patient bring in their glucometer from home?  No  How often do you monitor your CBG's? Twice daily.   Financial Strains and Diabetes Management:  Are you having any financial strains with the device, your supplies or your medication? No .  Does the patient want to be seen by Chronic Care Management for management of their diabetes?  No  Would the patient like to be referred to a Nutritionist or for Diabetic Management?  No   Diabetic Exams:  Diabetic Eye Exam: Completed 03/08/2022 Diabetic Foot Exam: Completed 08/03/2022  Interpreter Needed?: No  Information entered by :: NAllen LPN   Activities of Daily Living    11/23/2022   10:13 AM  In your present state of health, do you have any difficulty performing the following activities:  Hearing? 0  Vision? 0  Difficulty concentrating or making decisions? 0  Walking or climbing stairs? 0  Dressing or bathing? 0  Doing errands, shopping? 0  Preparing Food and eating ? N  Using the Toilet? N  In the past six months, have you accidently leaked urine? N  Do you have problems with loss of bowel control? N  Managing your Medications? N  Managing your Finances? N  Housekeeping or managing your Housekeeping? N    Patient Care Team: Tysinger, Camelia Eng, PA-C as PCP - General (Family Medicine) Brita Romp, NP as Nurse Practitioner (Nurse Practitioner) Princess Bruins, MD as Consulting Physician (Obstetrics and Gynecology)  Indicate any recent Medical Services you may have received from other than [facility] providers in the past year (date may be approximate).     Assessment:   This is a routine wellness examination for Ronny.  Hearing/Vision screen Vision Screening - Comments:: Regular eye exams, [facility]  Dietary issues and exercise activities discussed: Current Exercise Habits: The patient does not participate in  regular exercise at present   Goals Addressed             This Visit's Progress    Patient Stated       11/23/2022, wants to lose 5 pounds       Depression Screen    11/23/2022   10:10 AM 08/03/2022    9:25 AM 01/19/2022    2:15 PM 11/20/2021   10:37 AM 08/14/2021   12:06 PM 05/21/2021    2:13 PM 03/03/2021   10:40 AM  PHQ 2/9 Scores  PHQ - 2 Score 0 0 0 0 0 0 0  PHQ- 9 Score 1          Fall Risk    11/23/2022   10:10 AM 08/03/2022    9:25 AM 01/19/2022    2:15 PM 11/20/2021   10:37 AM 06/29/2021   10:12 AM  Fall Risk   Falls in the past year? 0 0 0 0 0  Number falls in past yr: 0 0 0    Injury with Fall? 0 0 0    Risk for fall due to : Medication side effect No Fall Risks No Fall Risks Medication side effect   Follow up Falls prevention discussed;Education provided;Falls evaluation completed Falls evaluation completed Falls evaluation completed Falls evaluation completed;Education provided;Falls prevention discussed Falls evaluation completed    FALL RISK PREVENTION PERTAINING TO THE HOME:  Any stairs in or around the home? Yes  If so, are there any without handrails? No  Home free of loose throw rugs in walkways, pet beds, electrical cords, etc? Yes  Adequate lighting in your home to reduce risk of falls? Yes   ASSISTIVE DEVICES UTILIZED TO PREVENT FALLS:  Life alert? No  Use of a cane, walker or w/c? No  Grab bars in the bathroom? Yes  Shower chair or bench in shower? No  Elevated toilet seat or a handicapped toilet? Yes   TIMED UP AND GO:  Was the test performed? No .      Cognitive Function:        11/23/2022   10:14 AM 11/20/2021   10:39 AM  6CIT Screen  What Year? 0 points 0 points  What month? 0 points 0 points  What time? 0 points 0 points  Count back from 20 0 points 0 points  Months in reverse 0 points 0 points  Repeat phrase 2 points 0 points  Total Score 2 points 0 points    Immunizations Immunization History  Administered Date(s) Administered    Fluad Quad(high Dose 65+) 09/10/2020, 08/14/2021, 08/03/2022   Influenza-Unspecified 08/09/2019   PFIZER Comirnaty(Gray Top)Covid-19 Tri-Sucrose Vaccine 05/28/2021   PFIZER(Purple Top)SARS-COV-2 Vaccination 12/31/2019, 01/02/2020, 08/25/2020   Pneumococcal Conjugate-13 11/24/2020   Pneumococcal Polysaccharide-23 01/19/2022    TDAP status: Due, Education has been provided regarding the importance of this vaccine. Advised may receive this vaccine at local pharmacy or Health Dept. Aware to provide a copy of the vaccination record if obtained from local pharmacy or Health Dept. Verbalized acceptance and understanding.  Flu Vaccine status: Up to date  Pneumococcal vaccine status: Up to date  Covid-19 vaccine status: Completed vaccines  Qualifies for Shingles Vaccine? Yes   Zostavax completed No   Shingrix Completed?: No.    Education has been provided regarding the importance of this vaccine. Patient has been advised to call insurance company to determine out of pocket expense if they have not yet received this vaccine. Advised may also receive vaccine at local pharmacy or Health Dept. Verbalized acceptance and understanding.  Screening Tests Health Maintenance  Topic Date Due   DTaP/Tdap/Td (1 - Tdap) Never done   Zoster Vaccines- Shingrix (1 of 2) Never done   Diabetic kidney evaluation - Urine ACR  06/29/2022   COVID-19 Vaccine (5 - 2023-24 season) 07/16/2022   Medicare Annual Wellness (AWV)  11/20/2022   HEMOGLOBIN A1C  01/28/2023   OPHTHALMOLOGY EXAM  03/09/2023   COLONOSCOPY (Pts 45-84yr Insurance coverage will need to be confirmed)  05/05/2023   FOOT EXAM  08/04/2023   Diabetic kidney evaluation - eGFR measurement  11/23/2023   MAMMOGRAM  01/22/2024   Pneumonia Vaccine 70 Years old  Completed   INFLUENZA VACCINE  Completed   DEXA SCAN  Completed   Hepatitis C Screening  Completed   HPV VACCINES  Aged Out    Health Maintenance  Health Maintenance Due  Topic Date Due    DTaP/Tdap/Td (1 - Tdap) Never done   Zoster Vaccines- Shingrix (1 of 2) Never done   Diabetic kidney evaluation - Urine ACR  06/29/2022   COVID-19 Vaccine (5 - 2023-24 season) 07/16/2022   Medicare Annual Wellness (AWV)  11/20/2022    Colorectal cancer screening: Type of screening: Colonoscopy. Completed 05/04/2013. Repeat every 10 years  Mammogram status: Completed 01/21/2022. Repeat every year  Bone Density status: Completed 10/22/2020.   Lung Cancer Screening: (Low Dose CT Chest recommended if Age 70-80years, 30 pack-year currently smoking OR have quit w/in 15years.) does not qualify.   Lung Cancer Screening Referral: no  Additional Screening:  Hepatitis C Screening: does qualify; Completed 11/24/2020  Vision Screening: Recommended annual ophthalmology exams for early detection of glaucoma and other disorders of the eye. Is the patient up to date with their annual eye exam?  Yes  Who is the provider or what is the name of the office in which the patient attends annual eye exams? [facility] If pt is not established with a provider, would they like to be referred to a provider to establish care? No .   Dental Screening: Recommended annual dental exams for proper oral hygiene  Community Resource Referral / Chronic Care Management: CRR required this visit?  No  CCM required this visit?  No      Plan:     I have personally reviewed and noted the following in the patient's chart:   Medical and social history Use of alcohol, tobacco or illicit drugs  Current medications and supplements including opioid prescriptions. Patient is not currently taking opioid prescriptions. Functional ability and status Nutritional status Physical activity Advanced directives List of other physicians Hospitalizations, surgeries, and ER visits in previous 12 months Vitals Screenings to include cognitive, depression, and falls Referrals and appointments  In addition, I have reviewed and  discussed with patient certain preventive protocols, quality metrics, and best practice recommendations. A written personalized care plan for preventive services as well as general preventive health recommendations were provided to patient.     Kellie Simmering, LPN   05/18/9448   Nurse Notes: none  Due to this being a virtual visit, the after visit summary with patients personalized plan was offered to patient via mail or my-chart. Patient would like to access on my-chart

## 2022-11-23 NOTE — Patient Instructions (Signed)
Courtney Barry , Thank you for taking time to come for your Medicare Wellness Visit. I appreciate your ongoing commitment to your health goals. Please review the following plan we discussed and let me know if I can assist you in the future.   These are the goals we discussed:  Goals      Patient Stated     11/20/2021, keep moving and stay active with golfing and walking     Patient Stated     11/23/2022, wants to lose 5 pounds        This is a list of the screening recommended for you and due dates:  Health Maintenance  Topic Date Due   DTaP/Tdap/Td vaccine (1 - Tdap) Never done   Zoster (Shingles) Vaccine (1 of 2) Never done   Yearly kidney health urinalysis for diabetes  06/29/2022   COVID-19 Vaccine (5 - 2023-24 season) 07/16/2022   Hemoglobin A1C  01/28/2023   Eye exam for diabetics  03/09/2023   Colon Cancer Screening  05/05/2023   Complete foot exam   08/04/2023   Yearly kidney function blood test for diabetes  11/23/2023   Medicare Annual Wellness Visit  11/24/2023   Mammogram  01/22/2024   Pneumonia Vaccine  Completed   Flu Shot  Completed   DEXA scan (bone density measurement)  Completed   Hepatitis C Screening: USPSTF Recommendation to screen - Ages 13-79 yo.  Completed   HPV Vaccine  Aged Out    Advanced directives: copy in chart  Conditions/risks identified: none  Next appointment: Follow up in one year for your annual wellness visit    Preventive Care 65 Years and Older, Female Preventive care refers to lifestyle choices and visits with your health care provider that can promote health and wellness. What does preventive care include? A yearly physical exam. This is also called an annual well check. Dental exams once or twice a year. Routine eye exams. Ask your health care provider how often you should have your eyes checked. Personal lifestyle choices, including: Daily care of your teeth and gums. Regular physical activity. Eating a healthy diet. Avoiding  tobacco and drug use. Limiting alcohol use. Practicing safe sex. Taking low-dose aspirin every day. Taking vitamin and mineral supplements as recommended by your health care provider. What happens during an annual well check? The services and screenings done by your health care provider during your annual well check will depend on your age, overall health, lifestyle risk factors, and family history of disease. Counseling  Your health care provider may ask you questions about your: Alcohol use. Tobacco use. Drug use. Emotional well-being. Home and relationship well-being. Sexual activity. Eating habits. History of falls. Memory and ability to understand (cognition). Work and work Statistician. Reproductive health. Screening  You may have the following tests or measurements: Height, weight, and BMI. Blood pressure. Lipid and cholesterol levels. These may be checked every 5 years, or more frequently if you are over 65 years old. Skin check. Lung cancer screening. You may have this screening every year starting at age 19 if you have a 30-pack-year history of smoking and currently smoke or have quit within the past 15 years. Fecal occult blood test (FOBT) of the stool. You may have this test every year starting at age 10. Flexible sigmoidoscopy or colonoscopy. You may have a sigmoidoscopy every 5 years or a colonoscopy every 10 years starting at age 37. Hepatitis C blood test. Hepatitis B blood test. Sexually transmitted disease (STD) testing. Diabetes screening.  This is done by checking your blood sugar (glucose) after you have not eaten for a while (fasting). You may have this done every 1-3 years. Bone density scan. This is done to screen for osteoporosis. You may have this done starting at age 74. Mammogram. This may be done every 1-2 years. Talk to your health care provider about how often you should have regular mammograms. Talk with your health care provider about your test  results, treatment options, and if necessary, the need for more tests. Vaccines  Your health care provider may recommend certain vaccines, such as: Influenza vaccine. This is recommended every year. Tetanus, diphtheria, and acellular pertussis (Tdap, Td) vaccine. You may need a Td booster every 10 years. Zoster vaccine. You may need this after age 78. Pneumococcal 13-valent conjugate (PCV13) vaccine. One dose is recommended after age 40. Pneumococcal polysaccharide (PPSV23) vaccine. One dose is recommended after age 73. Talk to your health care provider about which screenings and vaccines you need and how often you need them. This information is not intended to replace advice given to you by your health care provider. Make sure you discuss any questions you have with your health care provider. Document Released: 11/28/2015 Document Revised: 07/21/2016 Document Reviewed: 09/02/2015 Elsevier Interactive Patient Education  2017 [place] Prevention in the Home Falls can cause injuries. They can happen to people of all ages. There are many things you can do to make your home safe and to help prevent falls. What can I do on the outside of my home? Regularly fix the edges of walkways and driveways and fix any cracks. Remove anything that might make you trip as you walk through a door, such as a raised step or threshold. Trim any bushes or trees on the path to your home. Use bright outdoor lighting. Clear any walking paths of anything that might make someone trip, such as rocks or tools. Regularly check to see if handrails are loose or broken. Make sure that both sides of any steps have handrails. Any raised decks and porches should have guardrails on the edges. Have any leaves, snow, or ice cleared regularly. Use sand or salt on walking paths during winter. Clean up any spills in your garage right away. This includes oil or grease spills. What can I do in the bathroom? Use night  lights. Install grab bars by the toilet and in the tub and shower. Do not use towel bars as grab bars. Use non-skid mats or decals in the tub or shower. If you need to sit down in the shower, use a plastic, non-slip stool. Keep the floor dry. Clean up any water that spills on the floor as soon as it happens. Remove soap buildup in the tub or shower regularly. Attach bath mats securely with double-sided non-slip rug tape. Do not have throw rugs and other things on the floor that can make you trip. What can I do in the bedroom? Use night lights. Make sure that you have a light by your bed that is easy to reach. Do not use any sheets or blankets that are too big for your bed. They should not hang down onto the floor. Have a firm chair that has side arms. You can use this for support while you get dressed. Do not have throw rugs and other things on the floor that can make you trip. What can I do in the kitchen? Clean up any spills right away. Avoid walking on wet floors. Keep items  that you use a lot in easy-to-reach places. If you need to reach something above you, use a strong step stool that has a grab bar. Keep electrical cords out of the way. Do not use floor polish or wax that makes floors slippery. If you must use wax, use non-skid floor wax. Do not have throw rugs and other things on the floor that can make you trip. What can I do with my stairs? Do not leave any items on the stairs. Make sure that there are handrails on both sides of the stairs and use them. Fix handrails that are broken or loose. Make sure that handrails are as long as the stairways. Check any carpeting to make sure that it is firmly attached to the stairs. Fix any carpet that is loose or worn. Avoid having throw rugs at the top or bottom of the stairs. If you do have throw rugs, attach them to the floor with carpet tape. Make sure that you have a light switch at the top of the stairs and the bottom of the stairs. If  you do not have them, ask someone to add them for you. What else can I do to help prevent falls? Wear shoes that: Do not have high heels. Have rubber bottoms. Are comfortable and fit you well. Are closed at the toe. Do not wear sandals. If you use a stepladder: Make sure that it is fully opened. Do not climb a closed stepladder. Make sure that both sides of the stepladder are locked into place. Ask someone to hold it for you, if possible. Clearly mark and make sure that you can see: Any grab bars or handrails. First and last steps. Where the edge of each step is. Use tools that help you move around (mobility aids) if they are needed. These include: Canes. Walkers. Scooters. Crutches. Turn on the lights when you go into a dark area. Replace any light bulbs as soon as they burn out. Set up your furniture so you have a clear path. Avoid moving your furniture around. If any of your floors are uneven, fix them. If there are any pets around you, be aware of where they are. Review your medicines with your doctor. Some medicines can make you feel dizzy. This can increase your chance of falling. Ask your doctor what other things that you can do to help prevent falls. This information is not intended to replace advice given to you by your health care provider. Make sure you discuss any questions you have with your health care provider. Document Released: 08/28/2009 Document Revised: 04/08/2016 Document Reviewed: 12/06/2014 Elsevier Interactive Patient Education  2017 [employer].

## 2022-11-26 ENCOUNTER — Ambulatory Visit: Payer: Medicare PPO

## 2022-11-29 ENCOUNTER — Encounter: Payer: Medicare PPO | Attending: Medical | Admitting: Nutrition

## 2022-11-29 ENCOUNTER — Encounter: Payer: Self-pay | Admitting: Nutrition

## 2022-11-29 ENCOUNTER — Encounter: Payer: Self-pay | Admitting: Nurse Practitioner

## 2022-11-29 ENCOUNTER — Ambulatory Visit: Payer: Medicare PPO | Admitting: Nurse Practitioner

## 2022-11-29 VITALS — BP 111/65 | HR 85 | Ht 63.5 in | Wt 169.8 lb

## 2022-11-29 VITALS — Ht 63.0 in | Wt 169.0 lb

## 2022-11-29 DIAGNOSIS — I10 Essential (primary) hypertension: Secondary | ICD-10-CM | POA: Diagnosis not present

## 2022-11-29 DIAGNOSIS — E1165 Type 2 diabetes mellitus with hyperglycemia: Secondary | ICD-10-CM | POA: Diagnosis not present

## 2022-11-29 DIAGNOSIS — E782 Mixed hyperlipidemia: Secondary | ICD-10-CM

## 2022-11-29 DIAGNOSIS — E039 Hypothyroidism, unspecified: Secondary | ICD-10-CM | POA: Diagnosis not present

## 2022-11-29 DIAGNOSIS — E669 Obesity, unspecified: Secondary | ICD-10-CM | POA: Insufficient documentation

## 2022-11-29 DIAGNOSIS — E559 Vitamin D deficiency, unspecified: Secondary | ICD-10-CM

## 2022-11-29 LAB — POCT GLYCOSYLATED HEMOGLOBIN (HGB A1C): Hemoglobin A1C: 7.7 % — AB (ref 4.0–5.6)

## 2022-11-29 NOTE — Progress Notes (Signed)
Medical Nutrition Therapy:  Appt start time: 1030 end time:  1100  Assessment:  Primary concerns today: Diabetes Type 2 x 15 years. A1C up to 7.7% from 7.3%. No weight change since taking Ozempic. No longer taking Metformin.  Changes made: Got her treadmill fixed recently. Admits to over eating during holidays and birthday. She admits to not cooking a lot at home for convenience.  She is interested in Lifestyle Medicine to reverse her medical problems and reduce her medication burden. She wants to feel better and have more energy. Has felt tired and not a lot of energy lately. Ozempic, Tresiba 25 units at night. She does take Trazadone and this can impact blood sugars.  Sees Rayetta Pigg, FNP at [facility] for her DM.  Lab Results  Component Value Date   HGBA1C 7.7 (A) 11/29/2022   Wt Readings from Last 3 Encounters:  11/29/22 169 lb 12.8 oz (77 kg)  11/23/22 169 lb (76.7 kg)  08/03/22 170 lb 3.2 oz (77.2 kg)   Ht Readings from Last 3 Encounters:  11/29/22 5' 3.5" (1.613 m)  11/23/22 '5\' 4"'$  (1.626 m)  07/27/22 5' 3.5" (1.613 m)   There is no height or weight on file to calculate BMI. '@BMIFA'$ @ Facility age limit for growth %iles is 20 years. Facility age limit for growth %iles is 20 years.    Lab Results  Component Value Date   HGBA1C 7.7 (A) 11/29/2022      Latest Ref Rng & Units 11/22/2022    8:38 AM 04/13/2022    8:36 AM 12/21/2021    8:50 AM  CMP  Glucose 70 - 99 mg/dL 155  153  157   BUN 8 - 27 mg/dL '15  14  18   '$ Creatinine 0.57 - 1.00 mg/dL 0.85  0.74  0.79   Sodium 134 - 144 mmol/L 142  139  142   Potassium 3.5 - 5.2 mmol/L 4.2  4.2  4.3   Chloride 96 - 106 mmol/L 105  103  103   CO2 20 - 29 mmol/L '21  23  23   '$ Calcium 8.7 - 10.3 mg/dL 9.4  9.2  9.4   Total Protein 6.0 - 8.5 g/dL 6.8  7.1  7.1   Total Bilirubin 0.0 - 1.2 mg/dL 0.9  0.7  0.6   Alkaline Phos 44 - 121 IU/L 81  82  90   AST 0 - 40 IU/L '17  15  18   '$ ALT 0 - 32 IU/L '30  26  26       '$ Preferred Learning Style: No preference indicated   Learning Readiness:  Ready Change in progress   MEDICATIONS:    DIETARY INTAKE:   24-hr recall:  B ( AM): English muffin, egg whites, and oatmeal, Lunch: 3 chicken tenders, coleslaw, an 1/2 and 1/2  tea, toast, fries,  Dinner Baked chicken, steamed cabbage, green beans,  corn,  and sliced sweet potatoes 1/2,   Beverages: water  Usual physical activity:   Estimated energy needs: 1200 calories 135 g carbohydrates 90 g protein 33  g fat  Progress Towards Goal(s):  In progress.   Nutritional Diagnosis:  NB-1.1 Food and nutrition-related knowledge deficit As related to DIabetes Type 2.  As evidenced by A1C 6.1%  Basal, bolus insulin. Tresiba 25 units and 5 units of Humalog   She has the freestyle libre.  Intervention:  Meal planning, portion control, high fiber diet.  Goals  Focus on more plant based foods Exercise  on treadmill 3-4 times per week for 30 minutes Cut out fast foods and junk food Cut lose 3-5 lbs per month Drink 4 bottles of water per day;   Teaching Method Utilized:  Visual Auditory Hands on  Handouts given during visit include: The Plate Method  Meal Plan Card   Barriers to learning/adherence to lifestyle change: none  Demonstrated degree of understanding via:  Teach Back   Monitoring/Evaluation:  Dietary intake, exercise, , and body weight in 3 month(s).

## 2022-11-29 NOTE — Progress Notes (Signed)
11/29/2022, 10:42 AM                            Endocrinology follow-up note  Subjective:    Patient ID: Courtney Barry, female    DOB: 06/13/1951.  Courtney Barry is being seen in follow-up for management of currently uncontrolled symptomatic diabetes requested by  Carlena Hurl, PA-C.   Past Medical History:  Diagnosis Date   Diabetes mellitus without complication ([place]) 01/20/9023   Diabetes mellitus, type II ([place])    Encounter for specialized medical examination 09/04/2013   Hyperlipidemia    Hypothyroidism    Menopausal vaginal dryness    Normal gynecologic examination 09/05/2014   Vaccine counseling 05/21/2021    Past Surgical History:  Procedure Laterality Date   ABDOMINAL HYSTERECTOMY     APPENDECTOMY     CHOLECYSTECTOMY     TONSILLECTOMY      Social History   Socioeconomic History   Marital status: Married    Spouse name: Not on file   Number of children: Not on file   Years of education: Not on file   Highest education level: Not on file  Occupational History   Not on file  Tobacco Use   Smoking status: Never   Smokeless tobacco: Never  Vaping Use   Vaping Use: Never used  Substance and Sexual Activity   Alcohol use: Never   Drug use: Never   Sexual activity: Yes    Partners: Male    Comment: 1st intercourse- 81, partners- 18, married- 41 yrs,  Other Topics Concern   Not on file  Social History Narrative   Not on file   Social Determinants of Health   Financial Resource Strain: Low Risk  (11/23/2022)   Overall Financial Resource Strain (CARDIA)    Difficulty of Paying Living Expenses: Not hard at all  Food Insecurity: No Food Insecurity (11/23/2022)   Hunger Vital Sign    Worried About Running Out of Food in the Last Year: Never true    [place] in the Last Year: Never true  Transportation Needs: No Transportation Needs (11/23/2022)   PRAPARE -  Hydrologist (Medical): No    Lack of Transportation (Non-Medical): No  Physical Activity: Inactive (11/23/2022)   Exercise Vital Sign    Days of Exercise per Week: 0 days    Minutes of Exercise per Session: 0 min  Stress: No Stress Concern Present (11/23/2022)   Angie    Feeling of Stress : Not at all  Social Connections: Not on file    Family History  Problem Relation Age of Onset   Thyroid disease Mother    Cancer Mother    Pancreatic cancer Mother    Hypertension Father    Kidney disease Father    Thyroid cancer Sister        died at 80    Outpatient Encounter Medications as of 11/29/2022  Medication Sig   ACCU-CHEK GUIDE test strip USE 3 TIMES A DAY AS DIRECTED   Accu-Chek  Softclix Lancets lancets Use as instructed to monitor glucose twice daily   acetaminophen (TYLENOL) 500 MG tablet Take 500 mg by mouth at bedtime as needed.   atorvastatin (LIPITOR) 40 MG tablet Take 1 tablet (40 mg total) by mouth daily.   Blood Glucose Monitoring Suppl (ACCU-CHEK GUIDE ME) w/Device KIT Use as directed to check blood glucose twice daily before breakfast and bed. DX E11.65   estradiol (ESTRACE) 0.1 MG/GM vaginal cream Place 0.25 mg vaginally twice weekly.   Insulin Pen Needle (BD PEN NEEDLE NANO U/F) 32G X 4 MM MISC 1 Units by Does not apply route 2 (two) times daily.   loratadine-pseudoephedrine (CLARITIN-D 12-HOUR) 5-120 MG tablet Take 1 tablet by mouth daily as needed.   losartan (COZAAR) 25 MG tablet Take 1 tablet (25 mg total) by mouth daily.   Semaglutide, 1 MG/DOSE, 4 MG/3ML SOPN Inject 1 mg as directed once a week.   SYNTHROID 112 MCG tablet TAKE 1 TABLET BY MOUTH EVERY DAY   traZODone (DESYREL) 50 MG tablet TAKE 1/2 TO 1 TABLET BY MOUTH AT BEDTIME AS NEEDED FOR SLEEP   TRESIBA FLEXTOUCH 100 UNIT/ML FlexTouch Pen Inject 18 Units into the skin at bedtime. (Patient taking differently:  Inject 20 Units into the skin at bedtime.)   [DISCONTINUED] metFORMIN (GLUCOPHAGE-XR) 500 MG 24 hr tablet Take 1 tablet (500 mg total) by mouth every evening. Take one tablet each evening with dinner   No facility-administered encounter medications on file as of 11/29/2022.    ALLERGIES: Allergies  Allergen Reactions   Latex     Gloves, band aids- causes rash   Sulfa Antibiotics Other (See Comments)    VACCINATION STATUS: Immunization History  Administered Date(s) Administered   Fluad Quad(high Dose 65+) 09/10/2020, 08/14/2021, 08/03/2022   Influenza-Unspecified 08/09/2019   PFIZER Comirnaty(Gray Top)Covid-19 Tri-Sucrose Vaccine 05/28/2021   PFIZER(Purple Top)SARS-COV-2 Vaccination 12/31/2019, 01/02/2020, 08/25/2020   Pneumococcal Conjugate-13 11/24/2020   Pneumococcal Polysaccharide-23 01/19/2022    Diabetes She presents for her follow-up diabetic visit. She has type 2 diabetes mellitus. Onset time: She was diagnosed at approximate age of 28 years. Her disease course has been stable. There are no hypoglycemic associated symptoms. Pertinent negatives for hypoglycemia include no confusion, headaches, nervousness/anxiousness, pallor, seizures or tremors. Pertinent negatives for diabetes include no chest pain, no fatigue, no polydipsia, no polyphagia, no polyuria and no weight loss. There are no hypoglycemic complications. Symptoms are stable. Diabetic complications include nephropathy and retinopathy. Risk factors for coronary artery disease include diabetes mellitus, dyslipidemia, hypertension, post-menopausal, sedentary lifestyle and obesity. Current diabetic treatment includes insulin injections (and Ozempic). She is compliant with treatment most of the time. Her weight is fluctuating minimally. She is following a generally healthy diet. When asked about meal planning, she reported none. She has not had a previous visit with a dietitian. She participates in exercise intermittently. Her home  blood glucose trend is fluctuating minimally. Her breakfast blood glucose range is generally 130-140 mg/dl. Her bedtime blood glucose range is generally 140-180 mg/dl. (She presents today with her meter and logs showing at target glycemic profile overall.  Her POCT A1c today is 7.7%, increasing slightly from last visit of 7.3%.  Analysis of her meter shows 7-day average of 161, 14-day average of 154, 30-day average of 155, 90-day average of 162.) An ACE inhibitor/angiotensin II receptor blocker is being taken. She does not see a podiatrist.Eye exam is current.  Hyperlipidemia This is a chronic problem. The current episode started more than 1 year ago. The  problem is controlled. Recent lipid tests were reviewed and are normal. Exacerbating diseases include diabetes. There are no known factors aggravating her hyperlipidemia. Pertinent negatives include no chest pain, myalgias or shortness of breath. Current antihyperlipidemic treatment includes statins. The current treatment provides moderate improvement of lipids. There are no compliance problems.  Risk factors for coronary artery disease include diabetes mellitus, dyslipidemia, hypertension, a sedentary lifestyle and post-menopausal.  Thyroid Problem Presents for follow-up visit. Onset time: She was diagnosed with hypothyroidism in her 80s, currently taking Synthroid 112 mcg p.o. daily. Patient reports no anxiety, cold intolerance, constipation, depressed mood, diarrhea, fatigue, heat intolerance, leg swelling, palpitations, tremors, weight gain or weight loss. The symptoms have been stable. Her past medical history is significant for diabetes and hyperlipidemia.  Hypertension This is a chronic problem. The current episode started more than 1 year ago. The problem has been resolved since onset. The problem is controlled. Pertinent negatives include no chest pain, headaches, palpitations or shortness of breath. Agents associated with hypertension include  thyroid hormones. Risk factors for coronary artery disease include diabetes mellitus, dyslipidemia, family history, post-menopausal state and sedentary lifestyle. Past treatments include angiotensin blockers. The current treatment provides moderate improvement. There are no compliance problems.  Hypertensive end-organ damage includes retinopathy. Identifiable causes of hypertension include a thyroid problem.    Review of systems  Constitutional: + stable body weight,  current Body mass index is 29.61 kg/m. , + fatigue, no subjective hypothermia Eyes: no blurry vision, no xerophthalmia ENT: no sore throat, no nodules palpated in throat, no dysphagia/odynophagia, no hoarseness Cardiovascular: no chest pain, no shortness of breath, no palpitations, no leg swelling Respiratory: no cough, no shortness of breath Gastrointestinal: no nausea/vomiting/diarrhea, + mild nausea when switched to Ozempic but improving Musculoskeletal: no muscle/joint aches Skin: no rashes, no hyperemia Neurological: no tremors, no numbness, no tingling, no dizziness Psychiatric: no depression, no anxiety   Objective:    BP 111/65 (BP Location: Right Arm, Patient Position: Sitting, Cuff Size: Normal)   Pulse 85   Ht 5' 3.5" (1.613 m)   Wt 169 lb 12.8 oz (77 kg)   BMI 29.61 kg/m   Wt Readings from Last 3 Encounters:  11/29/22 169 lb (76.7 kg)  11/29/22 169 lb 12.8 oz (77 kg)  11/23/22 169 lb (76.7 kg)    BP Readings from Last 3 Encounters:  11/29/22 111/65  08/03/22 120/70  07/27/22 110/70     Physical Exam- Limited  Constitutional:  Body mass index is 29.61 kg/m. , not in acute distress, normal state of mind Eyes:  EOMI, no exophthalmos Musculoskeletal: no gross deformities, strength intact in all four extremities, no gross restriction of joint movements Skin:  no rashes, no hyperemia Neurological: no tremor with outstretched hands     Recent Results (from the past 2160 hour(s))  TSH     Status:  Abnormal   Collection Time: 11/22/22  8:38 AM  Result Value Ref Range   TSH 0.437 (L) 0.450 - 4.500 uIU/mL  T4, free     Status: None   Collection Time: 11/22/22  8:38 AM  Result Value Ref Range   Free T4 1.59 0.82 - 1.77 ng/dL  Comprehensive metabolic panel     Status: Abnormal   Collection Time: 11/22/22  8:38 AM  Result Value Ref Range   Glucose 155 (H) 70 - 99 mg/dL   BUN 15 8 - 27 mg/dL   Creatinine, Ser 0.85 0.57 - 1.00 mg/dL   eGFR 74 >59 mL/min/1.73  BUN/Creatinine Ratio 18 12 - 28   Sodium 142 134 - 144 mmol/L   Potassium 4.2 3.5 - 5.2 mmol/L   Chloride 105 96 - 106 mmol/L   CO2 21 20 - 29 mmol/L   Calcium 9.4 8.7 - 10.3 mg/dL   Total Protein 6.8 6.0 - 8.5 g/dL   Albumin 4.2 3.9 - 4.9 g/dL   Globulin, Total 2.6 1.5 - 4.5 g/dL   Albumin/Globulin Ratio 1.6 1.2 - 2.2   Bilirubin Total 0.9 0.0 - 1.2 mg/dL   Alkaline Phosphatase 81 44 - 121 IU/L   AST 17 0 - 40 IU/L   ALT 30 0 - 32 IU/L  HgB A1c     Status: Abnormal   Collection Time: 11/29/22 10:13 AM  Result Value Ref Range   Hemoglobin A1C 7.7 (A) 4.0 - 5.6 %   HbA1c POC (<> result, manual entry)     HbA1c, POC (prediabetic range)     HbA1c, POC (controlled diabetic range)         Assessment & Plan:   1) Uncontrolled type 2 diabetes mellitus with hyperglycemia ([place])  - Courtney Barry has currently controlled asymptomatic type 2 DM since  72 years of age.  She presents today with her meter and logs showing at target glycemic profile overall.  Her POCT A1c today is 7.7%, increasing slightly from last visit of 7.3%.  Analysis of her meter shows 7-day average of 161, 14-day average of 154, 30-day average of 155, 90-day average of 162.  -her diabetes is complicated by retinopathy obesity/sedentary life and she remains at a high risk for more acute and chronic complications which include CAD, CVA, CKD, retinopathy, and neuropathy. These are all discussed in detail with her.  - Nutritional counseling repeated  at each appointment due to patients tendency to fall back in to old habits.  - The patient admits there is a room for improvement in their diet and drink choices. -  Suggestion is made for the patient to avoid simple carbohydrates from their diet including Cakes, Sweet Desserts / Pastries, Ice Cream, Soda (diet and regular), Sweet Tea, Candies, Chips, Cookies, Sweet Pastries, Store Bought Juices, Alcohol in Excess of 1-2 drinks a day, Artificial Sweeteners, Coffee Creamer, and "Sugar-free" Products. This will help patient to have stable blood glucose profile and potentially avoid unintended weight gain.   - I encouraged the patient to switch to unprocessed or minimally processed complex starch and increased protein intake (animal or plant source), fruits, and vegetables.   - Patient is advised to stick to a routine mealtimes to eat 3 meals a day and avoid unnecessary snacks (to snack only to correct hypoglycemia).  - I have approached her with the following individualized plan to manage  her diabetes and patient agrees:   -She will continue to benefit from simplified treatment regimen, given her tight control of diabetes.    -She is advised to continue her Tresiba 25 units SQ nightly Ozempic 1 mg SQ weekly.  Her GI symptoms are much improved off the Metformin.  She can stay off that for now.  -She is encouraged to routinely fingerstick twice daily, before breakfast and before bed, and to call the clinic if she has readings less than 70 or above 300 for 3 tests in a row.  She did not tolerate CGM (adhesive allergy).  - She has allergy to sulfa medications, therefore not a candidate for glipizide.  - Specific targets for  A1c;  LDL, HDL, Triglycerides,  were discussed with the patient.  2) Blood Pressure /Hypertension:  Her blood pressure is controlled to target.  She is advised to continue Losartan 25 mg po daily.  3) Lipids/Hyperlipidemia:  Her most recent lipid panel from 04/13/22 shows  controlled LDL at 50.  She is advised to continue Lipitor 40 mg po daily at bedtime.  Side effects and precautions discussed with her.    4) Hypothyroidism- longstanding diagnosis since at least from her 48s.  Her previsit thyroid function tests are consistent with appropriate hormone replacement.  She is advised to continue Synthroid 112 mcg po daily before breakfast.  Will check thyroid ultrasound prior to next visit to assess baseline anatomy.  She does have family history of thyroid cancer (she is unsure of which type).  This may help determine her risk factor and if she needs to stop GLP1 therapy in the future.   - We discussed about the correct intake of her thyroid hormone, on empty stomach at fasting, with water, separated by at least 30 minutes from breakfast and other medications,  and separated by more than 4 hours from calcium, iron, multivitamins, acid reflux medications (PPIs). -Patient is made aware of the fact that thyroid hormone replacement is needed for life, dose to be adjusted by periodic monitoring of thyroid function tests.  5)  Weight/Diet:  Her Body mass index is 29.61 kg/m.-   she is a candidate for some weight loss. I discussed with her the fact that loss of 5 - 10% of her  current body weight will have the most impact on her diabetes management.  Exercise, and detailed carbohydrates information provided  -  detailed on discharge instructions.  6) Chronic Care/Health Maintenance: -she is on ACEI/ARB and Statin medications and is encouraged to initiate and continue to follow up with Ophthalmology, Dentist, Podiatrist at least yearly or according to recommendations, and advised to stay away from smoking. I have recommended yearly flu vaccine and pneumonia vaccine at least every 5 years; moderate intensity exercise for up to 150 minutes weekly; and sleep for at least 7 hours a day.  - she is advised to maintain close follow up with Tysinger, Camelia Eng, PA-C for primary care  needs, as well as her other providers for optimal and coordinated care.      I spent 44 minutes in the care of the patient today including review of labs from [place], Lipids, Thyroid Function, Hematology (current and previous including abstractions from other facilities); face-to-face time discussing  her blood glucose readings/logs, discussing hypoglycemia and hyperglycemia episodes and symptoms, medications doses, her options of short and long term treatment based on the latest standards of care / guidelines;  discussion about incorporating lifestyle medicine;  and documenting the encounter. Risk reduction counseling performed per USPSTF guidelines to reduce obesity and cardiovascular risk factors.     Please refer to Patient Instructions for Blood Glucose Monitoring and Insulin/Medications Dosing Guide"  in media tab for additional information. Please  also refer to " Patient Self Inventory" in the Media  tab for reviewed elements of pertinent patient history.  Courtney Barry participated in the discussions, expressed understanding, and voiced agreement with the above plans.  All questions were answered to her satisfaction. she is encouraged to contact clinic should she have any questions or concerns prior to her return visit.    Follow up plan: - Return in about 4 months (around 03/30/2023) for Diabetes F/U with A1c in office, Thyroid follow up, Previsit labs, thyroid ultrasound.  Rayetta Pigg, [facility] [address] [place], [place] [address] Phone: [phone] Fax: [phone]  11/29/2022, 10:42 AM

## 2022-11-29 NOTE — Patient Instructions (Addendum)
GOals  Focus on more plant based foods Exercise on treadmill 3-4 times per week for 30 minutes Cut out fast foods and junk food Cut lose 3-5 lbs per month Drink 4 bottles of water per day;

## 2022-12-08 ENCOUNTER — Other Ambulatory Visit: Payer: Self-pay | Admitting: Obstetrics & Gynecology

## 2022-12-08 ENCOUNTER — Encounter: Payer: Self-pay | Admitting: Family Medicine

## 2022-12-08 ENCOUNTER — Ambulatory Visit: Payer: Medicare PPO | Admitting: Family Medicine

## 2022-12-08 VITALS — BP 112/62 | HR 80 | Temp 97.5°F | Ht 63.5 in | Wt 170.0 lb

## 2022-12-08 DIAGNOSIS — Z1231 Encounter for screening mammogram for malignant neoplasm of breast: Secondary | ICD-10-CM

## 2022-12-08 DIAGNOSIS — H6991 Unspecified Eustachian tube disorder, right ear: Secondary | ICD-10-CM | POA: Diagnosis not present

## 2022-12-08 DIAGNOSIS — J309 Allergic rhinitis, unspecified: Secondary | ICD-10-CM

## 2022-12-08 NOTE — Progress Notes (Signed)
Chief Complaint  Patient presents with   Facial Pain    Facial stuffiness and pressure on the right side. Some runny nose. Right ear is not clogged, she describribes like a butterfly flying by and she wants to swat at it. Has lotsof sneezing, watery eyes and stuffy head. Symptoms started 2 weeks ago. Feels better today because she took 1/2 benadryl last night.    1/8 she woke up sneezing, and heard a fluttering in the ear (like a butterfly trying to get in the ear).  BP was normal.  The sound resolved the rest of the day.  The following day that feeling/sound recurred in the R ear.    She has had sneezing and clear runny nose since then. Feels similar to allergies in the past. She took 1/2 benadryl for the last 2 nights, which helped dry up the drainage, and decreased the pressure in the R side of her face.  Currently she has some pressure in her ear.  She has been having plugging and popping in the right ear, not painful.  Currently hearing seems dull/plugged, not completely plugged.  Denies sore throat, just sometimes scratchy.  Denies cough. Menthol cough drops help her throat. Hasn't used any claritin D recently, usually uses this for allergies (usually in Feb/March).  New perfume and candle. Stopped using the candle.  PMH, PSH, SH reviewed  Diabetic Lab Results  Component Value Date   HGBA1C 7.7 (A) 11/29/2022   Denies HTN--BP was 130 when losartan was started.    Outpatient Encounter Medications as of 12/08/2022  Medication Sig Note   ACCU-CHEK GUIDE test strip USE 3 TIMES A DAY AS DIRECTED    Accu-Chek Softclix Lancets lancets Use as instructed to monitor glucose twice daily    atorvastatin (LIPITOR) 40 MG tablet Take 1 tablet (40 mg total) by mouth daily.    Blood Glucose Monitoring Suppl (ACCU-CHEK GUIDE ME) w/Device KIT Use as directed to check blood glucose twice daily before breakfast and bed. DX E11.65    diphenhydrAMINE (BENADRYL) 25 MG tablet Take 12.5 mg by mouth  every 6 (six) hours as needed.    estradiol (ESTRACE) 0.1 MG/GM vaginal cream Place 0.25 mg vaginally twice weekly.    Insulin Pen Needle (BD PEN NEEDLE NANO U/F) 32G X 4 MM MISC 1 Units by Does not apply route 2 (two) times daily.    losartan (COZAAR) 25 MG tablet Take 1 tablet (25 mg total) by mouth daily.    Semaglutide, 1 MG/DOSE, 4 MG/3ML SOPN Inject 1 mg as directed once a week.    SYNTHROID 112 MCG tablet TAKE 1 TABLET BY MOUTH EVERY DAY    TRESIBA FLEXTOUCH 100 UNIT/ML FlexTouch Pen Inject 18 Units into the skin at bedtime. 11/29/2022: Patient states that she is injecting 25 units daily   acetaminophen (TYLENOL) 500 MG tablet Take 500 mg by mouth at bedtime as needed. (Patient not taking: Reported on 12/08/2022) 12/08/2022: prn   loratadine-pseudoephedrine (CLARITIN-D 12-HOUR) 5-120 MG tablet Take 1 tablet by mouth daily as needed. (Patient not taking: Reported on 12/08/2022) 12/08/2022: prn   traZODone (DESYREL) 50 MG tablet TAKE 1/2 TO 1 TABLET BY MOUTH AT BEDTIME AS NEEDED FOR SLEEP (Patient not taking: Reported on 12/08/2022) 12/08/2022: As needed   No facility-administered encounter medications on file as of 12/08/2022.    Allergies  Allergen Reactions   Latex     Gloves, band aids- causes rash   Sulfa Antibiotics Other (See Comments)   ROS:  no  f/c, n/v/d, no sinus or teeth pain, ear pain or sore throat (just intermittently scratchy).   Occasional heartburn, related to eating tomatoes. No cough, shortness of breath, chest pain, palpitations. No bleeding, bruising, rashes. Sugars are unchanged--not higher than usual (130's in the morning; recently started on ozempic).  Slight diarrhea related to the ozempic, depending on what she eats.   PHYSICAL EXAM:  BP 112/62   Pulse 80   Temp (!) 97.5 F (36.4 C) (Tympanic)   Ht 5' 3.5" (1.613 m)   Wt 170 lb (77.1 kg)   BMI 29.64 kg/m   Well-appearing, pleasant female, in no distress. No sniffling, throat-clearing or coughing during  visit. HEENT: conjunctiva and sclera are clear, EOMI.  TM's and EAC's are normal. No effusion, erythema, there is a normal light reflex. Nasal mucosa is moderately edematous L>R, no purulence or erythema.  Sinuses are nontender (area of pressure is over R maxillary sinus, but note tender). OP is clear Neck: no lymphadenopathy or mass Heart: regular rate and rhythm Lungs: clear bilaterally Neuro: alert and oriented, cranial nerves grossly intact, normal gait Psych: normal mood, affect, hygiene and grooming    ASSESSMENT/PLAN:  Dysfunction of right eustachian tube  Allergic rhinitis, unspecified seasonality, unspecified trigger  Drink plenty of fluids, which keeps the mucus thin. You can try some sinus rinses (Neti-pot or sinus rinse kit) as needed for sinus pain or pressure (use distilled or boiled water, not tap water). You should restart Flonase, 2 sprays into each nostril, and use it daily.  This can take up to 10 days for its full effect. You should continue to use this, since your allergy season is almost upon [place].  Keep this up through March. You may use your Claritin D now, while waiting for the flonase to become effective. Once your and sinus pressure improves, you can stop the Claritin, but continue the Flonase. If your mucus is very thick, and you're having trouble clearing the sinuses, you can add mucinex (guaifenesin). This is an expectorant that loosens mucus and phlegm (especially if you are having any cough related to postnasal drainage).  Monitor your blood pressure while taking claritin-D. If it gets to 140/90, please stop the  medication.  You shouldn't use it long-term if BP's are running over 130/80 (I don't think you will need this long-term).  If you develop fever, worsening symptoms (ear pain, worsening sinus pain, discolored mucus, cough, etc), then you may need to be re-evaluated for an infection.

## 2022-12-08 NOTE — Patient Instructions (Addendum)
Drink plenty of fluids, which keeps the mucus thin. You can try some sinus rinses (Neti-pot or sinus rinse kit) as needed for sinus pain or pressure (use distilled or boiled water, not tap water). You should restart Flonase, 2 sprays into each nostril, and use it daily.  This can take up to 10 days for its full effect. You should continue to use this, since your allergy season is almost upon [place].  Keep this up through March. You may use your Claritin D now, while waiting for the flonase to become effective. Once your and sinus pressure improves, you can stop the Claritin, but continue the Flonase. If your mucus is very thick, and you're having trouble clearing the sinuses, you can add mucinex (guaifenesin). This is an expectorant that loosens mucus and phlegm (especially if you are having any cough related to postnasal drainage).  Monitor your blood pressure while taking claritin-D. If it gets to 140/90, please stop the  medication.  You shouldn't use it long-term if BP's are running over 130/80 (I don't think you will need this long-term).  If you develop fever, worsening symptoms (ear pain, worsening sinus pain, discolored mucus, cough, etc), then you may need to be re-evaluated for an infection.

## 2022-12-10 ENCOUNTER — Ambulatory Visit (HOSPITAL_COMMUNITY)
Admission: RE | Admit: 2022-12-10 | Discharge: 2022-12-10 | Disposition: A | Discharge status: Home / Self Care | Payer: Medicare PPO | Source: Ambulatory Visit | Attending: Nurse Practitioner | Admitting: Nurse Practitioner

## 2022-12-10 DIAGNOSIS — E039 Hypothyroidism, unspecified: Secondary | ICD-10-CM | POA: Diagnosis not present

## 2022-12-10 DIAGNOSIS — E041 Nontoxic single thyroid nodule: Secondary | ICD-10-CM | POA: Diagnosis not present

## 2022-12-13 ENCOUNTER — Encounter: Payer: Self-pay | Admitting: Nurse Practitioner

## 2022-12-13 NOTE — Telephone Encounter (Signed)
-----  Message from Brita Romp, NP sent at 12/13/2022  7:04 AM EST ----- FYI: just sent patient message going over thyroid ultrasound results

## 2022-12-13 NOTE — Progress Notes (Signed)
Noted  

## 2022-12-20 NOTE — Progress Notes (Unsigned)
OB/GYN, [address]  [place] [id number]  Operated by [facility]  Progress Note    Name: Kristin Kennedy MRN:  [id number]   Date: 12/22/2022 Age: 72 y.o.        ANNUAL VISIT    PATIENT: Kristin Kennedy  CHART NUMBER: [id number]  DATE OF SERVICE: 12/22/2022    CC: "Annual exam"  Chief Complaint   Patient presents with    Annual Exam     Patient is a 72 yo who presents today for a yearly check-up. She states she is not having any problems.        HPI: Kristin Kennedy is a 72 y.o. year old No obstetric history on file..  She presents to clinic for her annual exam. She is doing well.  No LMP recorded. Patient has had a hysterectomy.          REVIEW OF SYSTEM  Pertinent review of system as per HPI.    OB History:  OB History   Gravida Para Term Preterm AB Living   1 0 0   1     SAB IAB Ectopic Multiple Live Births   1              # Outcome Date GA Lbr Len/2nd Weight Sex Delivery Anes PTL Lv   1 SAB                PAST MEDICAL HISTORY:  Past Medical History:   Diagnosis Date    BCC (basal cell carcinoma), eyelid     Cervical dysplasia     Hypothyroidism     Miscarriage            PAST SURGICAL HISTORY:  Past Surgical History:   Procedure Laterality Date    BASAL CELL CARCINOMA EXCISION      EYELID    HX BREAST BIOPSY      HX COLPOSCOPY      HX CRYOSURGERY OF CERVIX      HX HYSTERECTOMY      TVH WO BSO    OTHER SURGICAL HISTORY      FX OF FEMUR           FAMILY HISTORY:  Family Medical History:       Problem Relation (Age of Onset)    Breast Cancer Maternal Aunt, Maternal cousin    Diabetes Paternal Grandfather    High Cholesterol Father    Melanoma Paternal Grandfather              SOCIAL HISTORY:  Social History     Socioeconomic History    Marital status: Married   Tobacco Use    Smoking status: Former     Types: Cigarettes    Smokeless tobacco: Never   Brewing technologist Use: Never used   Substance and Sexual Activity    Alcohol use: Not Currently    Drug use: Never     Sexual activity: Yes     Partners: Male     Birth control/protection: Surgical        CURRENT MEDICATIONS:   Current Outpatient Medications   Medication Sig    Ascorbic Acid 100 mg Oral Tablet, Chewable Chew 5 Tablets (500 mg total)    calcium carbonate 500 mg/5 mL (1,250 mg/5 mL) Oral Suspension Take by mouth    cholecalciferol, Vitamin D3, 125 mcg (5,000 unit) Oral Tablet Take 1 Tablet (5,000 Units total) by mouth  levothyroxine (SYNTHROID) 75 mcg Oral Tablet Synthroid 75 mcg tablet   TAKE 1 TABLET BY MOUTH ONCE DAILY IN THE MORNING BEFORE BREAKFAST    omega 3-dha-epa-fish oil 1,000 mg (250 mg-750 mg)/5 mL Oral Liquid Take 1,000 mg by mouth       ALLERGIES:  Patient has no known allergies.     PHYSICAL EXAMINATION:   Vitals:    12/22/22 1029   BP: 116/66   Weight: 62.6 kg (138 lb)   Height: 1.6 m ('5\' 3"'$ )   BMI: 24.5           Body mass index is 24.45 kg/m.   Physical Exam  Constitutional:       General: She is awake.      Appearance: Normal appearance.   Genitourinary:      No lesions in the vagina.      Right Labia: No rash, tenderness, lesions, skin changes or Bartholin's cyst.     Left Labia: No tenderness, lesions, skin changes, Bartholin's cyst or rash.     No vaginal discharge, erythema, tenderness, bleeding, ulceration or granulation tissue.      No vaginal prolapse present.     No vaginal atrophy present.       Right Adnexa: not tender, not full and no mass present.     Left Adnexa: not tender, not full and no mass present.     No cervical discharge, friability, lesion or polyp.      Uterus is not enlarged or tender.   Breasts:     Breasts are symmetrical.      Right: No swelling, mass, nipple discharge, skin change or tenderness.      Left: No swelling, mass, nipple discharge, skin change or tenderness.   Neck:      Thyroid: No thyroid mass, thyromegaly or thyroid tenderness.   Cardiovascular:      Rate and Rhythm: Normal rate and regular rhythm.      Heart sounds: Normal heart sounds.   Pulmonary:       Effort: No tachypnea, bradypnea, accessory muscle usage or prolonged expiration.      Breath sounds: Normal breath sounds.   Abdominal:      General: There is no distension.      Palpations: Abdomen is soft. There is no hepatomegaly or mass.   Musculoskeletal:      Cervical back: Normal range of motion.   Lymphadenopathy:      Cervical:      Right cervical: No superficial, deep or posterior cervical adenopathy.     Left cervical: No superficial, deep or posterior cervical adenopathy.   Neurological:      Mental Status: She is alert.          ASSESSMENT:  (Z01.419) Encounter for well woman exam with routine gynecological exam  (primary encounter diagnosis)    (Z12.31) Encounter for screening mammogram for malignant neoplasm of breast       PLAN:  Full annual check up completed.    Denies bone fractures within the past 6 months.    Following with the breast center for dense breasts.  MRI and mammograms now Q 6 months.    Had colonoscopy 2020.  Due again 2030.    Endocrinology following DXA scan.  Last done 4/21.  Normal pap 2015 and 2017 and 2019.  Cotesting done per patient request and negative 2022.  No longer needs paps.  S/P hysterectomy for benign reasons.    No questions or concerns.  RTO  in 1 year or prn.      No orders of the defined types were placed in this encounter.         Renea Ee, PA-C

## 2022-12-21 ENCOUNTER — Other Ambulatory Visit: Payer: Self-pay | Admitting: "Endocrinology

## 2022-12-22 ENCOUNTER — Other Ambulatory Visit: Payer: Self-pay

## 2022-12-22 ENCOUNTER — Encounter (INDEPENDENT_AMBULATORY_CARE_PROVIDER_SITE_OTHER): Payer: Self-pay | Admitting: Medical

## 2022-12-22 ENCOUNTER — Ambulatory Visit: Payer: Medicare PPO | Attending: Medical | Admitting: Medical

## 2022-12-22 VITALS — BP 116/66 | Ht 63.0 in | Wt 138.0 lb

## 2022-12-22 DIAGNOSIS — Z9189 Other specified personal risk factors, not elsewhere classified: Secondary | ICD-10-CM

## 2022-12-22 DIAGNOSIS — Z1231 Encounter for screening mammogram for malignant neoplasm of breast: Secondary | ICD-10-CM | POA: Insufficient documentation

## 2022-12-22 DIAGNOSIS — Z1239 Encounter for other screening for malignant neoplasm of breast: Secondary | ICD-10-CM

## 2022-12-22 DIAGNOSIS — Z90710 Acquired absence of both cervix and uterus: Secondary | ICD-10-CM

## 2022-12-22 DIAGNOSIS — Z01419 Encounter for gynecological examination (general) (routine) without abnormal findings: Secondary | ICD-10-CM | POA: Insufficient documentation

## 2023-01-04 ENCOUNTER — Ambulatory Visit (HOSPITAL_COMMUNITY): Payer: Self-pay

## 2023-01-05 ENCOUNTER — Other Ambulatory Visit: Payer: Self-pay | Admitting: Medical

## 2023-01-05 DIAGNOSIS — F5101 Primary insomnia: Secondary | ICD-10-CM

## 2023-01-09 ENCOUNTER — Other Ambulatory Visit: Payer: Self-pay | Admitting: Nurse Practitioner

## 2023-01-10 ENCOUNTER — Telehealth: Payer: Medicare PPO | Admitting: Medical

## 2023-01-10 ENCOUNTER — Other Ambulatory Visit (INDEPENDENT_AMBULATORY_CARE_PROVIDER_SITE_OTHER): Payer: Medicare PPO

## 2023-01-10 ENCOUNTER — Encounter: Payer: Self-pay | Admitting: Medical

## 2023-01-10 VITALS — BP 117/76 | HR 103 | Temp 98.6°F | Ht 63.5 in | Wt 168.0 lb

## 2023-01-10 DIAGNOSIS — R051 Acute cough: Secondary | ICD-10-CM | POA: Diagnosis not present

## 2023-01-10 DIAGNOSIS — R059 Cough, unspecified: Secondary | ICD-10-CM | POA: Diagnosis not present

## 2023-01-10 DIAGNOSIS — U071 COVID-19: Secondary | ICD-10-CM

## 2023-01-10 DIAGNOSIS — R6889 Other general symptoms and signs: Secondary | ICD-10-CM

## 2023-01-10 LAB — POC COVID19 BINAXNOW: SARS Coronavirus 2 Ag: POSITIVE — AB

## 2023-01-10 LAB — POCT INFLUENZA A/B
Influenza A, POC: NEGATIVE
Influenza B, POC: NEGATIVE

## 2023-01-10 MED ORDER — HYDROCODONE BIT-HOMATROP MBR 5-1.5 MG/5ML PO SOLN: 5.0000 mL | Freq: Three times a day (TID) | ORAL | 0 refills | Status: AC | PRN

## 2023-01-10 NOTE — Progress Notes (Signed)
Subjective:     Patient ID: Courtney Barry, female   DOB: [birth year], 72 y.o.   MRN: [id number]  This visit type was conducted due to national recommendations for restrictions regarding the COVID-19 Pandemic (e.g. social distancing) in an effort to limit this patient's exposure and mitigate transmission in our community.  Due to their co-morbid illnesses, this patient is at least at moderate risk for complications without adequate follow up.  This format is felt to be most appropriate for this patient at this time.    Documentation for virtual audio and video telecommunications through [place] encounter:  The patient was located at home. The provider was located in the office. The patient did consent to this visit and is aware of possible charges through their insurance for this visit.  The other persons participating in this telemedicine service were none. Time spent on call was 20 minutes and in review of previous records 20 minutes total.  This virtual service is not related to other E/M service within previous 7 days.   HPI Chief Complaint  Patient presents with   Cough    VIRTUAL fever started Wed night. Thursday evening fever was gone but cough, ST and body aches started. Has not done any home covid tests.    Virtual for illness.  Started 4-5 days ago.   had fever around 24 hours but then resolved the next night.   Initially sore throat, body aches, cough.  Having some headache, head congestion, ears feel stopped up, sore throat, coughing a lot.  Using robitussin DM.   No SOB or wheezing.  Feels weak.  Using tylenol every 6 hours.  During day benadryl and robitussin helps with cough, but at night cough is deep and aggravates her all night. No other aggravating or relieving factors. No other complaint.  No sick contacts with flu or covid.  Husband had visit last week for similar symptoms.  But he is better now.  Good about water intake.  No other aggravating or relieving factors.  No other complaint.  Past Medical History:  Diagnosis Date   Diabetes mellitus without complication ([place]) [id number]   Diabetes mellitus, type II ([place])    Encounter for specialized medical examination 09/04/2013   Hyperlipidemia    Hypothyroidism    Menopausal vaginal dryness    Normal gynecologic examination 09/05/2014   Vaccine counseling 05/21/2021   Current Outpatient Medications on File Prior to Visit  Medication Sig Dispense Refill   Accu-Chek Softclix Lancets lancets Use as instructed to monitor glucose twice daily 100 each 12   acetaminophen (TYLENOL) 500 MG tablet Take 1,000 mg by mouth at bedtime as needed.     atorvastatin (LIPITOR) 40 MG tablet Take 1 tablet (40 mg total) by mouth daily. 90 tablet 3   Blood Glucose Monitoring Suppl (ACCU-CHEK GUIDE ME) w/Device KIT Use as directed to check blood glucose twice daily before breakfast and bed. DX E11.65 1 kit 0   Cholecalciferol (VITAMIN D) 125 MCG (5000 UT) CAPS Take 1 capsule by mouth daily.     diphenhydrAMINE (BENADRYL) 25 MG tablet Take 12.5 mg by mouth every 6 (six) hours as needed.     estradiol (ESTRACE) 0.1 MG/GM vaginal cream Place 0.25 mg vaginally twice weekly. 42.5 g 0   glucose blood (ACCU-CHEK GUIDE) test strip USE TO CHECK BLOOD GLUCOSE TWICE DAILY 100 strip 2   Insulin Pen Needle (BD PEN NEEDLE NANO U/F) 32G X 4 MM MISC 1 Units by Does not apply route 2 (  two) times daily. 100 each 3   loratadine (CLARITIN) 10 MG tablet Take 10 mg by mouth daily.     losartan (COZAAR) 25 MG tablet Take 1 tablet (25 mg total) by mouth daily. 90 tablet 3   Multiple Vitamin (MULTIVITAMIN) tablet Take 1 tablet by mouth daily.     Semaglutide, 1 MG/DOSE, 4 MG/3ML SOPN Inject 1 mg as directed once a week. 6 mL 3   SYNTHROID 112 MCG tablet TAKE 1 TABLET BY MOUTH EVERY DAY 90 tablet 1   traZODone (DESYREL) 50 MG tablet TAKE 1/2 TO 1 TABLET BY MOUTH AT BEDTIME AS NEEDED FOR SLEEP (Patient not taking: Reported on 01/10/2023) 90 tablet 0    TRESIBA FLEXTOUCH 100 UNIT/ML FlexTouch Pen Inject 25 Units into the skin at bedtime. 30 mL 0   No current facility-administered medications on file prior to visit.    Review of Systems As in subjective    Objective:   Physical Exam Due to coronavirus pandemic stay at home measures, patient visit was virtual and they were not examined in person.   BP 117/76   Pulse (!) 103   Temp 98.6 F (37 C) (Temporal)   Ht 5' 3.5" (1.613 m)   Wt 168 lb (76.2 kg)   BMI 29.29 kg/m   Gen: wd, wn, nad No obvious wheezing or labored breathing.      Assessment:     Encounter Diagnoses  Name Primary?   Acute cough Yes   Flu-like symptoms        Plan:     Advised rest, good hydration, can continue Robitussin during the day, Hycodan syrup below at night.  Discussed risk and benefits and proper use of medication.  She can continue Tylenol for fever and not feeling well.  She is on about day 5 of symptoms.  She will come today to our back parking lot for testing.  Positive for covid infection today.   Covid infection, covid illness general recommendations: I recommend you rest, hydrate well with water and clear fluids throughout the day.  If you feel dry in the mouth, tongue or feel that you are urinating as much as usual, then increase hydration.  You urine should be like yellow to clear, not dark yellow or darker.     Pain, body aches, or fever: You can use Tylenol /Acetaminophen '325mg'$  over the counter for pain or fever, every 4-6 hours  Cough: You can use Hycodan prescription medicaiton for cough.   This medication can cause drowsiness so use caution. Don't take this medication and drive or operate machinery.  Cough and congestion: You can use over the counter Mucinex DM or Coricidin HBP for cough and congestion, but don't use within 3-4 hours of the prescribed stronger cough syrup today   Nausea: You can use over the counter Emetrol for nausea.     Antiviral medication: You  are unfortunately out of the window of time for the paxlovid antiviral medication.   In the next few days, if you are having trouble breathing, if you are very weak, have high fever 103 or higher consistently despite Tylenol, or uncontrollable nausea and vomiting, then call or go to the emergency department.    If you have other questions or have other symptoms or questions you are concerned about then please make a virtual visit   Covid symptoms such as fatigue and cough can linger over 2 weeks, even after the initial fever, aches, chills, and other initial symptoms.  Self Quarantine: The CDC, Centers for Disease Control has recommended a self quarantine of 5 days from the start of your illness until you are symptom-free including at least 24 hours of no symptoms including no fever, no shortness of breath, and no body aches and chills, by day 5 before returning to work or general contact with the public.  What does self quarantine mean: avoiding contact with people as much as possible.   Particularly in your house, isolate your self from others in a separate room, wear a mask when possible in the room, particularly if coughing a lot.   Have others bring food, water, medications, etc., to your door, but avoid direct contact with your household contacts during this time to avoid spreading the infection to them.   If you have a separate bathroom and living quarters during the next 2 weeks away from others, that would be preferable.    If you can't completely isolate, then wear a mask, wash hands frequently with soap and water for at least 15 seconds, minimize close contact with others, and have a friend or family member check regularly from a distance to make sure you are not getting seriously worse.     You should not be going out in public, should not be going to stores, to work or other public places until all your symptoms have resolved and at least 5 days + 24 hours of no symptoms at all have  transpired.   Ideally you should avoid contact with others for a full 5 days if possible.  One of the goals is to limit spread to high risk people; people that are older and elderly, people with multiple health issues like diabetes, heart disease, lung disease, and anybody that has weakened immune systems such as people with cancer or on immunosuppressive therapy.     Jaklyn was seen today for cough.  Diagnoses and all orders for this visit:  Acute cough  Flu-like symptoms  Other orders -     HYDROcodone bit-homatropine (HYCODAN) 5-1.5 MG/5ML syrup; Take 5 mLs by mouth every 8 (eight) hours as needed for up to 8 days for cough.    F/u prn

## 2023-01-10 NOTE — Progress Notes (Signed)
Positive for covid infection today.   Covid infection, covid illness general recommendations: I recommend you rest, hydrate well with water and clear fluids throughout the day.  If you feel dry in the mouth, tongue or feel that you are urinating as much as usual, then increase hydration.  You urine should be like yellow to clear, not dark yellow or darker.     Pain, body aches, or fever: You can use Tylenol /Acetaminophen '325mg'$  over the counter for pain or fever, every 4-6 hours  Cough: You can use Hycodan prescription medicaiton for cough.   This medication can cause drowsiness so use caution. Don't take this medication and drive or operate machinery.  Cough and congestion: You can use over the counter Mucinex DM or Coricidin HBP for cough and congestion, but dont' use within 3-4 hours of the prescired stronger cough syrup today   Nausea: You can use over the counter Emetrol for nausea.     Antiviral medication: You are unfortunatley out of the window of time for the paxlovid antiviral medication.   In the next few days, if you are having trouble breathing, if you are very weak, have high fever 103 or higher consistently despite Tylenol, or uncontrollable nausea and vomiting, then call or go to the emergency department.    If you have other questions or have other symptoms or questions you are concerned about then please make a virtual visit   Covid symptoms such as fatigue and cough can linger over 2 weeks, even after the initial fever, aches, chills, and other initial symptoms.   Self Quarantine: The CDC, Centers for Disease Control has recommended a self quarantine of 5 days from the start of your illness until you are symptom-free including at least 24 hours of no symptoms including no fever, no shortness of breath, and no body aches and chills, by day 5 before returning to work or general contact with the public.  What does self quarantine mean: avoiding contact with people  as much as possible.   Particularly in your house, isolate your self from others in a separate room, wear a mask when possible in the room, particularly if coughing a lot.   Have others bring food, water, medications, etc., to your door, but avoid direct contact with your household contacts during this time to avoid spreading the infection to them.   If you have a separate bathroom and living quarters during the next 2 weeks away from others, that would be preferable.    If you can't completely isolate, then wear a mask, wash hands frequently with soap and water for at least 15 seconds, minimize close contact with others, and have a friend or family member check regularly from a distance to make sure you are not getting seriously worse.     You should not be going out in public, should not be going to stores, to work or other public places until all your symptoms have resolved and at least 5 days + 24 hours of no symptoms at all have transpired.   Ideally you should avoid contact with others for a full 5 days if possible.  One of the goals is to limit spread to high risk people; people that are older and elderly, people with multiple health issues like diabetes, heart disease, lung disease, and anybody that has weakened immune systems such as people with cancer or on immunosuppressive therapy.

## 2023-01-27 ENCOUNTER — Ambulatory Visit: Payer: Medicare PPO | Admitting: Medical

## 2023-01-27 VITALS — BP 132/64 | HR 96 | Ht 64.5 in | Wt 169.0 lb

## 2023-01-27 DIAGNOSIS — Z1211 Encounter for screening for malignant neoplasm of colon: Secondary | ICD-10-CM | POA: Diagnosis not present

## 2023-01-27 DIAGNOSIS — I10 Essential (primary) hypertension: Secondary | ICD-10-CM | POA: Diagnosis not present

## 2023-01-27 DIAGNOSIS — Z7185 Encounter for immunization safety counseling: Secondary | ICD-10-CM

## 2023-01-27 DIAGNOSIS — E785 Hyperlipidemia, unspecified: Secondary | ICD-10-CM | POA: Diagnosis not present

## 2023-01-27 DIAGNOSIS — Z0000 Encounter for general adult medical examination without abnormal findings: Secondary | ICD-10-CM

## 2023-01-27 DIAGNOSIS — E039 Hypothyroidism, unspecified: Secondary | ICD-10-CM

## 2023-01-27 DIAGNOSIS — F5101 Primary insomnia: Secondary | ICD-10-CM

## 2023-01-27 DIAGNOSIS — I739 Peripheral vascular disease, unspecified: Secondary | ICD-10-CM

## 2023-01-27 DIAGNOSIS — E1165 Type 2 diabetes mellitus with hyperglycemia: Secondary | ICD-10-CM | POA: Diagnosis not present

## 2023-01-27 DIAGNOSIS — E1169 Type 2 diabetes mellitus with other specified complication: Secondary | ICD-10-CM | POA: Diagnosis not present

## 2023-01-27 LAB — POCT URINALYSIS DIP (PROADVANTAGE DEVICE)
Bilirubin, UA: NEGATIVE
Blood, UA: NEGATIVE
Glucose, UA: NEGATIVE mg/dL
Ketones, POC UA: NEGATIVE mg/dL
Leukocytes, UA: NEGATIVE
Nitrite, UA: NEGATIVE
Protein Ur, POC: NEGATIVE mg/dL
Specific Gravity, Urine: 1.02
Urobilinogen, Ur: NEGATIVE
pH, UA: 6 (ref 5.0–8.0)

## 2023-01-27 NOTE — Patient Instructions (Signed)
Separate significant issues discussed: Diabetes and hypothyroidism-managed by endocrinology  Hypertension-continue current medication losartan 25 mg daily  Hyperlipidemia-continue Lipitor 40 mg daily  Tennis elbow, lateral epicondylitis-advise relative rest, cool therapy, arm sling periodically, short-term over-the-counter Aleve.  If not improving the next 2 weeks recheck   Referral today for colonoscopy, gastroenterology referral   Get your tetanus and shingles vaccine at the pharmacy.  Make sure the pharmacy sends [place] a copy of the vaccine data

## 2023-01-27 NOTE — Progress Notes (Signed)
Subjective:   HPI  Courtney Barry is a 72 y.o. female who presents for Chief Complaint  Patient presents with   fasting cpe    Fasting cpe, having some arm pain from elbow down.    Patient Care Team: Ricki Clack, Camelia Eng, PA-C as PCP - General (Family Medicine) Brita Romp, NP as Nurse Practitioner (Nurse Practitioner) Princess Bruins, MD as Consulting Physician (Gynecologic Oncology)   Concerns: Compliant with medication  Sees endocrinology for diabetes and thyroid  She notes recently having some left forearm pain, sometimes shoulder pain, thinks it is tendinitis.  No swelling injury or trauma.  She is right-handed  Sometimes left lower leg feels colder or colder than the right  Reviewed their medical, surgical, family, social, medication, and allergy history and updated chart as appropriate.  Allergies  Allergen Reactions   Latex     Gloves, band aids- causes rash   Sulfa Antibiotics Other (See Comments)    Past Medical History:  Diagnosis Date   Diabetes mellitus without complication ([place]) [id number]   Diabetes mellitus, type II ([place])    Encounter for specialized medical examination 09/04/2013   Hyperlipidemia    Hypothyroidism    Menopausal vaginal dryness    Normal gynecologic examination 09/05/2014   Vaccine counseling 05/21/2021    Current Outpatient Medications on File Prior to Visit  Medication Sig Dispense Refill   Accu-Chek Softclix Lancets lancets Use as instructed to monitor glucose twice daily 100 each 12   acetaminophen (TYLENOL) 500 MG tablet Take 1,000 mg by mouth at bedtime as needed.     atorvastatin (LIPITOR) 40 MG tablet Take 1 tablet (40 mg total) by mouth daily. 90 tablet 3   Blood Glucose Monitoring Suppl (ACCU-CHEK GUIDE ME) w/Device KIT Use as directed to check blood glucose twice daily before breakfast and bed. DX E11.65 1 kit 0   Cholecalciferol (VITAMIN D) 125 MCG (5000 UT) CAPS Take 1 capsule by mouth daily.     glucose blood  (ACCU-CHEK GUIDE) test strip USE TO CHECK BLOOD GLUCOSE TWICE DAILY 100 strip 2   loratadine (CLARITIN) 10 MG tablet Take 10 mg by mouth daily.     losartan (COZAAR) 25 MG tablet Take 1 tablet (25 mg total) by mouth daily. 90 tablet 3   Multiple Vitamin (MULTIVITAMIN) tablet Take 1 tablet by mouth daily.     Semaglutide, 1 MG/DOSE, 4 MG/3ML SOPN Inject 1 mg as directed once a week. 6 mL 3   SYNTHROID 112 MCG tablet TAKE 1 TABLET BY MOUTH EVERY DAY 90 tablet 1   traZODone (DESYREL) 50 MG tablet TAKE 1/2 TO 1 TABLET BY MOUTH AT BEDTIME AS NEEDED FOR SLEEP 90 tablet 0   TRESIBA FLEXTOUCH 100 UNIT/ML FlexTouch Pen Inject 25 Units into the skin at bedtime. 30 mL 0   Insulin Pen Needle (BD PEN NEEDLE NANO U/F) 32G X 4 MM MISC 1 Units by Does not apply route 2 (two) times daily. 100 each 3   No current facility-administered medications on file prior to visit.      Current Outpatient Medications:    Accu-Chek Softclix Lancets lancets, Use as instructed to monitor glucose twice daily, Disp: 100 each, Rfl: 12   acetaminophen (TYLENOL) 500 MG tablet, Take 1,000 mg by mouth at bedtime as needed., Disp: , Rfl:    atorvastatin (LIPITOR) 40 MG tablet, Take 1 tablet (40 mg total) by mouth daily., Disp: 90 tablet, Rfl: 3   Blood Glucose Monitoring Suppl (ACCU-CHEK GUIDE ME) w/Device  KIT, Use as directed to check blood glucose twice daily before breakfast and bed. DX E11.65, Disp: 1 kit, Rfl: 0   Cholecalciferol (VITAMIN D) 125 MCG (5000 UT) CAPS, Take 1 capsule by mouth daily., Disp: , Rfl:    glucose blood (ACCU-CHEK GUIDE) test strip, USE TO CHECK BLOOD GLUCOSE TWICE DAILY, Disp: 100 strip, Rfl: 2   loratadine (CLARITIN) 10 MG tablet, Take 10 mg by mouth daily., Disp: , Rfl:    losartan (COZAAR) 25 MG tablet, Take 1 tablet (25 mg total) by mouth daily., Disp: 90 tablet, Rfl: 3   Multiple Vitamin (MULTIVITAMIN) tablet, Take 1 tablet by mouth daily., Disp: , Rfl:    Semaglutide, 1 MG/DOSE, 4 MG/3ML SOPN,  Inject 1 mg as directed once a week., Disp: 6 mL, Rfl: 3   SYNTHROID 112 MCG tablet, TAKE 1 TABLET BY MOUTH EVERY DAY, Disp: 90 tablet, Rfl: 1   traZODone (DESYREL) 50 MG tablet, TAKE 1/2 TO 1 TABLET BY MOUTH AT BEDTIME AS NEEDED FOR SLEEP, Disp: 90 tablet, Rfl: 0   TRESIBA FLEXTOUCH 100 UNIT/ML FlexTouch Pen, Inject 25 Units into the skin at bedtime., Disp: 30 mL, Rfl: 0   Insulin Pen Needle (BD PEN NEEDLE NANO U/F) 32G X 4 MM MISC, 1 Units by Does not apply route 2 (two) times daily., Disp: 100 each, Rfl: 3  Family History  Problem Relation Age of Onset   Thyroid disease Mother    Cancer Mother    Pancreatic cancer Mother    Hypertension Father    Kidney disease Father    Thyroid cancer Sister        died at 59    Past Surgical History:  Procedure Laterality Date   ABDOMINAL HYSTERECTOMY     APPENDECTOMY     CHOLECYSTECTOMY     TONSILLECTOMY      Review of Systems  Constitutional:  Negative for chills, fever, malaise/fatigue and weight loss.  HENT:  Negative for congestion, ear pain, hearing loss, sore throat and tinnitus.   Eyes:  Negative for blurred vision, pain and redness.  Respiratory:  Negative for cough, hemoptysis and shortness of breath.   Cardiovascular:  Negative for chest pain, palpitations, orthopnea, claudication and leg swelling.  Gastrointestinal:  Negative for abdominal pain, blood in stool, constipation, diarrhea, nausea and vomiting.  Genitourinary:  Negative for dysuria, flank pain, frequency, hematuria and urgency.  Musculoskeletal:  Positive for joint pain and myalgias. Negative for falls.  Skin:  Negative for itching and rash.  Neurological:  Negative for dizziness, tingling, speech change, weakness and headaches.  Endo/Heme/Allergies:  Negative for polydipsia. Does not bruise/bleed easily.  Psychiatric/Behavioral:  Negative for depression and memory loss. The patient is not nervous/anxious and does not have insomnia.        Objective:  BP 132/64    Pulse 96   Ht 5' 4.5" (1.638 m)   Wt 169 lb (76.7 kg)   BMI 28.56 kg/m   Wt Readings from Last 3 Encounters:  01/27/23 169 lb (76.7 kg)  01/10/23 168 lb (76.2 kg)  12/08/22 170 lb (77.1 kg)   BP Readings from Last 3 Encounters:  01/27/23 132/64  01/10/23 117/76  12/08/22 112/62   General appearance: alert, no distress, WD/WN, African American female Skin: Numerous scattered skin tags of the anterior neck, no other worrisome lesions HEENT: normocephalic, conjunctiva/corneas normal, sclerae anicteric, PERRLA, EOMi, nares patent, no discharge or erythema, pharynx normal Oral cavity: MMM, tongue normal, teeth in good repair Neck: supple, no  lymphadenopathy, no thyromegaly, no masses, normal ROM, no bruits Chest: non tender, normal shape and expansion Heart: RRR, normal S1, S2, no murmurs Lungs: CTA bilaterally, no wheezes, rhonchi, or rales Abdomen: +bs, soft, non tender, non distended, no masses, no hepatomegaly, no splenomegaly, no bruits Back: non tender, normal ROM, no scoliosis Musculoskeletal: Tender over left lateral epicondyle of arm over the region, mild tenderness in left forearm, mild pain with resisted supination, otherwise upper extremities non tender, no obvious deformity, normal ROM throughout, lower extremities non tender, no obvious deformity, normal ROM throughout Extremities: no edema, no cyanosis, no clubbing Pulses: 2+ symmetric, upper and lower extremities, normal cap refill Neurological: alert, oriented x 3, CN2-12 intact, strength normal upper extremities and lower extremities, sensation normal throughout, DTRs 2+ throughout, no cerebellar signs, gait normal Psychiatric: normal affect, behavior normal, pleasant  Breast/gyn/rectal - deferred to gynecology   Diabetic Foot Exam - Simple   Simple Foot Form Visual Inspection No deformities, no ulcerations, no other skin breakdown bilaterally: Yes Sensation Testing Intact to touch and monofilament testing  bilaterally: Yes Pulse Check Posterior Tibialis and Dorsalis pulse intact bilaterally: Yes Comments    EKG reviewed   Assessment and Plan :   Encounter Diagnoses  Name Primary?   Encounter for health maintenance examination in adult Yes   Uncontrolled type 2 diabetes mellitus with hyperglycemia ([place])    Hypothyroidism, unspecified type    Hyperlipidemia associated with type 2 diabetes mellitus ([place])    Essential hypertension, benign    PVD (peripheral vascular disease) ([place])    Vaccine counseling    Primary insomnia    Screen for colon cancer      This visit was a preventative care visit, also known as wellness visit or routine physical.   Topics typically include healthy lifestyle, diet, exercise, preventative care, vaccinations, sick and well care, proper use of emergency dept and after hours care, as well as other concerns.    Separate significant issues discussed: Diabetes and hypothyroidism-managed by endocrinology  Hypertension-continue current medication losartan 25 mg daily  Hyperlipidemia-continue Lipitor 40 mg daily  Tennis elbow, lateral epicondylitis-advise relative rest, cool therapy, arm sling periodically, short-term over-the-counter Aleve.  If not improving the next 2 weeks recheck  General Recommendations: Continue to return yearly for your annual wellness and preventative care visits.  This gives [place] a chance to discuss healthy lifestyle, exercise, vaccinations, review your chart record, and perform screenings where appropriate.  I recommend you see your eye doctor yearly for routine vision care.  I recommend you see your dentist yearly for routine dental care including hygiene visits twice yearly.   Vaccination recommendations were reviewed Immunization History  Administered Date(s) Administered   Fluad Quad(high Dose 65+) 09/10/2020, 08/14/2021, 08/03/2022   Influenza-Unspecified 08/09/2019   PFIZER Comirnaty(Gray Top)Covid-19 Tri-Sucrose Vaccine  05/28/2021   PFIZER(Purple Top)SARS-COV-2 Vaccination 12/31/2019, 01/02/2020, 08/25/2020   Pneumococcal Conjugate-13 11/24/2020   Pneumococcal Polysaccharide-23 01/19/2022    Vaccine recommendations: Tdap tetanus Shingrix Covid booster  Get these at your pharmacy   Screening for cancer: Colon cancer screening: We will refer you for screening colonoscopy  Breast cancer screening: You should perform a self breast exam monthly.   We reviewed recommendations for regular mammograms and breast cancer screening.  Skin cancer screening: Check your skin regularly for new changes, growing lesions, or other lesions of concern Come in for evaluation if you have skin lesions of concern.  Lung cancer screening: If you have a greater than 20 pack year history of tobacco use,  then you may qualify for lung cancer screening with a chest CT scan.   Please call your insurance company to inquire about coverage for this test.  Pancreatic cancer: no current screening test is available routinely recommended.  (Risk factors: Smoking, overweight or obese, diabetes, chronic pancreatitis, work Aeronautical engineer, Actuary, 46 year old or greater, female greater than female, African-American, family history of pancreatic cancer, hereditary breast, ovarian, melanoma, Lynch, Peutz-jeghers).  We currently don't have screenings for other cancers besides breast, cervical, colon, and lung cancers.  If you have a strong family history of cancer or have other cancer screening concerns, please let me know.    Bone health: Get at least 150 minutes of aerobic exercise weekly Get weight bearing exercise at least once weekly Bone density test:  A bone density test is an imaging test that uses a type of X-ray to measure the amount of calcium and other minerals in your bones. The test may be used to diagnose or screen you for a condition that causes weak or thin bones (osteoporosis), predict your risk for a broken  bone (fracture), or determine how well your osteoporosis treatment is working. The bone density test is recommended for females 44 and older, or females or males [id number] if certain risk factors such as thyroid disease, long term use of steroids such as for asthma or rheumatological issues, vitamin D deficiency, estrogen deficiency, family history of osteoporosis, self or family history of fragility fracture in first degree relative.  Plan for repeat in 2025-2026.  Last study normal in 2021.   Heart health: Get at least 150 minutes of aerobic exercise weekly Limit alcohol It is important to maintain a healthy blood pressure and healthy cholesterol numbers  Heart disease screening: Screening for heart disease includes screening for blood pressure, fasting lipids, glucose/diabetes screening, BMI height to weight ratio, reviewed of smoking status, physical activity, and diet.    Goals include blood pressure 120/80 or less, maintaining a healthy lipid/cholesterol profile, preventing diabetes or keeping diabetes numbers under good control, not smoking or using tobacco products, exercising most days per week or at least 150 minutes per week of exercise, and eating healthy variety of fruits and vegetables, healthy oils, and avoiding unhealthy food choices like fried food, fast food, high sugar and high cholesterol foods.    Other tests may possibly include EKG test, CT coronary calcium score, echocardiogram, exercise treadmill stress test.    Vascular disease screening: For high risk individuals including smokers, diabetes, patients with known heart disease or high blood pressure, kidney disease, and others, screening for vascular disease or atherosclerosis of the arteries is available.  Examples may include carotid ultrasound, abdominal aortic ultrasound, [name] blood flow screening in the legs, thoracic aorta screening.  [name] normal 06/2021   Medical care options: I recommend you continue to seek care  here first for routine care.  We try really hard to have available appointments Monday through Friday daytime hours for sick visits, acute visits, and physicals.  Urgent care should be used for after hours and weekends for significant issues that cannot wait till the next day.  The emergency department should be used for significant potentially life-threatening emergencies.  The emergency department is expensive, can often have long wait times for less significant concerns, so try to utilize primary care, urgent care, or telemedicine when possible to avoid unnecessary trips to the emergency department.  Virtual visits and telemedicine have been introduced since the pandemic started in 2020, and can be convenient ways to receive  medical care.  We offer virtual appointments as well to assist you in a variety of options to seek medical care.   Legal Take the time to do a Last Will and Testament, advanced directives including Healthcare Power of Attorney and Living Will documents.  Do not leave your family with burdens that can be handled ahead of time.   Advanced Directives: I recommend you consider completing a [place] and Living Will.   These documents respect your wishes and help alleviate burdens on your loved ones if you were to become terminally ill or be in a position to need those documents enforced.    You can complete Advanced Directives yourself, have them notarized, then have copies made for our office, for you and for anybody you feel should have them in safe keeping.  Or, you can have an attorney prepare these documents.   If you haven't updated your Last Will and Testament in a while, it may be worthwhile having an attorney prepare these documents together and save on some costs.       Spiritual and Emotional Health Keeping a healthy spiritual life can help you better manage your physical health. Your spiritual life can help you to cope with any issues that may  arise with your physical health.  Balance can keep [place] healthy and help [place] to recover.  If you are struggling with your spiritual health there are questions that you may want to ask yourself:  What makes me feel most complete? When do I feel most connected to the rest of the world? Where do I find the most inner strength? What am I doing when I feel whole?  Helpful tips: Being in nature. Some people feel very connected and at peace when they are walking outdoors or are outside. Helping others. Some feel the largest sense of wellbeing when they are of service to others. Being of service can take on many forms. It can be doing volunteer work, being kind to strangers, or offering a hand to a friend in need. Gratitude. Some people find they feel the most connected when they remain grateful. They may make lists of all the things they are grateful for or say a thank you out loud for all they have.    Emotional Health Are you in tune with your emotional health?  Check out this link: http://www.bray.com/   Financial Health Make sure you use a budget for your personal finances Make sure you are insured against risks (health insurance, life insurance, auto insurance, etc) Save more, spend less Set financial goals If you need help in this area, good resources include counseling through [employer] or other community resources, have a meeting with a Emergency planning/management officer, and a good resource is Luvenia Heller podcast    Rhonda was seen today for fasting cpe.  Diagnoses and all orders for this visit:  Encounter for health maintenance examination in adult -     CBC -     Lipid panel -     Microalbumin/Creatinine Ratio, Urine -     POCT Urinalysis DIP (Proadvantage Device) -     EKG 12-Lead -     Ambulatory referral to Gastroenterology  Uncontrolled type 2 diabetes mellitus with hyperglycemia ([facility]) -     Microalbumin/Creatinine Ratio, Urine  Hypothyroidism, unspecified  type  Hyperlipidemia associated with type 2 diabetes mellitus ([place]) -     Lipid panel  Essential hypertension, benign -     Ambulatory referral  to Gastroenterology  PVD (peripheral vascular disease) ([place])  Vaccine counseling  Primary insomnia  Screen for colon cancer -     Ambulatory referral to Gastroenterology   Spent > 45 minutes face to face with patient in discussion of symptoms, evaluation, plan and recommendations.     Follow-up pending labs, yearly for physical

## 2023-01-28 ENCOUNTER — Ambulatory Visit
Admission: RE | Admit: 2023-01-28 | Discharge: 2023-01-28 | Disposition: A | Discharge status: Home / Self Care | Payer: Medicare PPO | Source: Ambulatory Visit | Attending: Obstetrics & Gynecology | Admitting: Obstetrics & Gynecology

## 2023-01-28 DIAGNOSIS — Z1231 Encounter for screening mammogram for malignant neoplasm of breast: Secondary | ICD-10-CM

## 2023-01-29 LAB — CBC
Hematocrit: 41.9 % (ref 34.0–46.6)
Hemoglobin: 13.3 g/dL (ref 11.1–15.9)
MCH: 25.6 pg — ABNORMAL LOW (ref 26.6–33.0)
MCHC: 31.7 g/dL (ref 31.5–35.7)
MCV: 81 fL (ref 79–97)
Platelets: 202 10*3/uL (ref 150–450)
RBC: 5.2 x10E6/uL (ref 3.77–5.28)
RDW: 13.7 % (ref 11.7–15.4)
WBC: 4.7 10*3/uL (ref 3.4–10.8)

## 2023-01-29 LAB — MICROALBUMIN / CREATININE URINE RATIO
Creatinine, Urine: 115.8 mg/dL
Microalb/Creat Ratio: <3 mg/g creat (ref 0–29)
Microalbumin, Urine: <3 ug/mL

## 2023-01-29 LAB — LIPID PANEL
Chol/HDL Ratio: 2.5 ratio (ref 0.0–4.4)
Cholesterol, Total: 116 mg/dL (ref 100–199)
HDL: 47 mg/dL (ref >39)
LDL Chol Calc (NIH): 56 mg/dL (ref 0–99)
Triglycerides: 60 mg/dL (ref 0–149)
VLDL Cholesterol Cal: 13 mg/dL (ref 5–40)

## 2023-01-31 NOTE — Progress Notes (Signed)
Results sent through MyChart

## 2023-02-23 ENCOUNTER — Encounter: Payer: Self-pay | Admitting: Gastroenterology

## 2023-02-28 ENCOUNTER — Ambulatory Visit: Payer: Medicare PPO | Admitting: Medical

## 2023-02-28 VITALS — BP 120/70 | HR 84 | Temp 97.4°F | Wt 171.4 lb

## 2023-02-28 DIAGNOSIS — J301 Allergic rhinitis due to pollen: Secondary | ICD-10-CM | POA: Diagnosis not present

## 2023-02-28 DIAGNOSIS — I889 Nonspecific lymphadenitis, unspecified: Secondary | ICD-10-CM

## 2023-02-28 NOTE — Progress Notes (Signed)
Subjective:  Courtney Barry is a 72 y.o. female who presents for Chief Complaint  Patient presents with   knot under chin    Knot under chin. Noticed it Thursday. Gets in the way when eating and having pain with it. Taking Aleve, and Afrin to help with pain     Here for concern of knot under chin.  She notes it appeared last week on left (under neck).  Size of at least a nickel or bird egg.   The knot is sore/tender.  Been using some aleve to help.    In past week some runny nose, some irritated throat, some headache.   No cough.  Had some sneezing last week.  Using some afrin last week.    No recent fever, no body aches or chills.  No NVD.    Went to dentist last week.   Had 2 crowns done at dentist last week, top, not bottom teeth.   Other than the area upper that was addressed with the crown, no other decay noted.    No other aggravating or relieving factors.    No other c/o.  Past Medical History:  Diagnosis Date   Diabetes mellitus without complication ([facility]) 11/21/2019   Diabetes mellitus, type II ([facility])    Encounter for specialized medical examination 09/04/2013   Hyperlipidemia    Hypothyroidism    Menopausal vaginal dryness    Normal gynecologic examination 09/05/2014   Vaccine counseling 05/21/2021   Current Outpatient Medications on File Prior to Visit  Medication Sig Dispense Refill   Accu-Chek Softclix Lancets lancets Use as instructed to monitor glucose twice daily 100 each 12   atorvastatin (LIPITOR) 40 MG tablet Take 1 tablet (40 mg total) by mouth daily. 90 tablet 3   Cholecalciferol (VITAMIN D) 125 MCG (5000 UT) CAPS Take 1 capsule by mouth daily.     loratadine (CLARITIN) 10 MG tablet Take 10 mg by mouth daily.     losartan (COZAAR) 25 MG tablet Take 1 tablet (25 mg total) by mouth daily. 90 tablet 3   Multiple Vitamin (MULTIVITAMIN) tablet Take 1 tablet by mouth daily.     Semaglutide, 1 MG/DOSE, 4 MG/3ML SOPN Inject 1 mg as directed once a week. 6 mL 3    SYNTHROID 112 MCG tablet TAKE 1 TABLET BY MOUTH EVERY DAY 90 tablet 1   traZODone (DESYREL) 50 MG tablet TAKE 1/2 TO 1 TABLET BY MOUTH AT BEDTIME AS NEEDED FOR SLEEP 90 tablet 0   TRESIBA FLEXTOUCH 100 UNIT/ML FlexTouch Pen Inject 25 Units into the skin at bedtime. 30 mL 0   Blood Glucose Monitoring Suppl (ACCU-CHEK GUIDE ME) w/Device KIT Use as directed to check blood glucose twice daily before breakfast and bed. DX E11.65 1 kit 0   glucose blood (ACCU-CHEK GUIDE) test strip USE TO CHECK BLOOD GLUCOSE TWICE DAILY 100 strip 2   Insulin Pen Needle (BD PEN NEEDLE NANO U/F) 32G X 4 MM MISC 1 Units by Does not apply route 2 (two) times daily. 100 each 3   No current facility-administered medications on file prior to visit.    The following portions of the patient's history were reviewed and updated as appropriate: allergies, current medications, past family history, past medical history, past social history, past surgical history and problem list.  ROS Otherwise as in subjective above  Objective: BP 120/70   Pulse 84   Temp (!) 97.4 F (36.3 C)   Wt 171 lb 6.4 oz (77.7 kg)  BMI 28.97 kg/m   General appearance: alert, no distress, well developed, well nourished HEENT: normocephalic, sclerae anicteric, conjunctiva pink and moist, TMs pearly, nares patent, some clear discharge, no erythema, pharynx normal Oral cavity: MMM, no lesions Neck: supple, shotty tender lymph node on the left anterior region, otherwise no other lymphadenopathy, no thyromegaly, no masses    Assessment: Encounter Diagnoses  Name Primary?   Lymphadenitis Yes   Allergic rhinitis due to pollen, unspecified seasonality      Plan: We discussed her symptoms and exam findings.  There is no significant lymphadenopathy or nodules suggesting worrisome tumor or lymphoma.  Given her allergy issues and recent dental work, this suggest reactive lymphadenitis.  We did discuss holding off on Afrin and instead using Flonase  or Nasacort in the morning and allergy pill at nighttime.  She is already using some allergy medication.  Continue to hydrate well.  If worse symptoms such as heart or larger nodule over the next 2 weeks or seemingly growing in size nodules in the neck then recheck.  Right now nothing to suggest any other worrisome causes.  Reassured.  We discussed the habit-forming nature of Afrin, use this for very limited use  Trina was seen today for knot under chin.  Diagnoses and all orders for this visit:  Lymphadenitis  Allergic rhinitis due to pollen, unspecified seasonality    Follow up: prn

## 2023-03-07 ENCOUNTER — Telehealth: Payer: Self-pay | Admitting: Medical

## 2023-03-07 ENCOUNTER — Other Ambulatory Visit: Payer: Self-pay | Admitting: Medical

## 2023-03-07 MED ORDER — IBUPROFEN 600 MG PO TABS: 600.0000 mg | ORAL_TABLET | Freq: Two times a day (BID) | ORAL | 0 refills | Status: DC

## 2023-03-07 MED ORDER — AMOXICILLIN-POT CLAVULANATE 875-125 MG PO TABS: 1.0000 | ORAL_TABLET | Freq: Two times a day (BID) | ORAL | 0 refills | Status: DC

## 2023-03-07 NOTE — Telephone Encounter (Signed)
Courtney Barry called and states the knot under her chin is still there and hasn't gotten any smaller but also hasn't gotten any bigger and she is wondering if you can send her in something for infection. She believes it is infected.

## 2023-03-07 NOTE — Telephone Encounter (Signed)
Pt states when she eats or chews she gets the pain. She is doing soup and soft foods. She is not having any teeth issues as she has already seen dentist for it. She is taking aleve twice a day. Knot is about the same. No fever, chills or body aches but have been sneezing and feel stuffy in the head. Please advise

## 2023-03-07 NOTE — Telephone Encounter (Signed)
Pt was notified.  

## 2023-03-14 DIAGNOSIS — H40033 Anatomical narrow angle, bilateral: Secondary | ICD-10-CM | POA: Diagnosis not present

## 2023-03-14 DIAGNOSIS — H04123 Dry eye syndrome of bilateral lacrimal glands: Secondary | ICD-10-CM | POA: Diagnosis not present

## 2023-03-14 DIAGNOSIS — E119 Type 2 diabetes mellitus without complications: Secondary | ICD-10-CM | POA: Diagnosis not present

## 2023-03-14 DIAGNOSIS — H25813 Combined forms of age-related cataract, bilateral: Secondary | ICD-10-CM | POA: Diagnosis not present

## 2023-03-14 LAB — HM DIABETES EYE EXAM

## 2023-03-16 ENCOUNTER — Encounter: Payer: Self-pay | Admitting: Internal Medicine

## 2023-03-18 ENCOUNTER — Telehealth: Payer: Self-pay | Admitting: Nurse Practitioner

## 2023-03-18 DIAGNOSIS — E1165 Type 2 diabetes mellitus with hyperglycemia: Secondary | ICD-10-CM

## 2023-03-18 DIAGNOSIS — E039 Hypothyroidism, unspecified: Secondary | ICD-10-CM

## 2023-03-18 NOTE — Telephone Encounter (Signed)
Next Visit states previsit labs but I do not see any labs ordered.  Does she need labs?

## 2023-03-18 NOTE — Telephone Encounter (Signed)
Called pt and let her know about labs.

## 2023-03-18 NOTE — Telephone Encounter (Signed)
I put some in.

## 2023-03-22 DIAGNOSIS — E1165 Type 2 diabetes mellitus with hyperglycemia: Secondary | ICD-10-CM | POA: Diagnosis not present

## 2023-03-22 DIAGNOSIS — E039 Hypothyroidism, unspecified: Secondary | ICD-10-CM | POA: Diagnosis not present

## 2023-03-23 LAB — COMPREHENSIVE METABOLIC PANEL
ALT: 32 IU/L (ref 0–32)
AST: 21 IU/L (ref 0–40)
Albumin/Globulin Ratio: 1.6 (ref 1.2–2.2)
Albumin: 4.4 g/dL (ref 3.9–4.9)
Alkaline Phosphatase: 86 IU/L (ref 44–121)
BUN/Creatinine Ratio: 26 (ref 12–28)
BUN: 18 mg/dL (ref 8–27)
Bilirubin Total: 0.7 mg/dL (ref 0.0–1.2)
CO2: 21 mmol/L (ref 20–29)
Calcium: 9.8 mg/dL (ref 8.7–10.3)
Chloride: 102 mmol/L (ref 96–106)
Creatinine, Ser: 0.7 mg/dL (ref 0.57–1.00)
Globulin, Total: 2.7 g/dL (ref 1.5–4.5)
Glucose: 151 mg/dL — ABNORMAL HIGH (ref 70–99)
Potassium: 4.3 mmol/L (ref 3.5–5.2)
Sodium: 140 mmol/L (ref 134–144)
Total Protein: 7.1 g/dL (ref 6.0–8.5)
eGFR: 94 mL/min/{1.73_m2} (ref >59)

## 2023-03-23 LAB — TSH: TSH: 0.697 u[IU]/mL (ref 0.450–4.500)

## 2023-03-23 LAB — T4, FREE: Free T4: 1.57 ng/dL (ref 0.82–1.77)

## 2023-03-29 ENCOUNTER — Encounter: Payer: Medicare PPO | Attending: Medical | Admitting: Nutrition

## 2023-03-29 ENCOUNTER — Encounter: Payer: Self-pay | Admitting: Nurse Practitioner

## 2023-03-29 ENCOUNTER — Ambulatory Visit: Payer: Medicare PPO | Admitting: Nurse Practitioner

## 2023-03-29 ENCOUNTER — Encounter: Payer: Self-pay | Admitting: Nutrition

## 2023-03-29 VITALS — BP 111/72 | HR 83 | Ht 63.5 in | Wt 168.6 lb

## 2023-03-29 VITALS — Ht 64.0 in | Wt 168.0 lb

## 2023-03-29 DIAGNOSIS — E1165 Type 2 diabetes mellitus with hyperglycemia: Secondary | ICD-10-CM | POA: Diagnosis not present

## 2023-03-29 DIAGNOSIS — E039 Hypothyroidism, unspecified: Secondary | ICD-10-CM

## 2023-03-29 DIAGNOSIS — I10 Essential (primary) hypertension: Secondary | ICD-10-CM

## 2023-03-29 DIAGNOSIS — E559 Vitamin D deficiency, unspecified: Secondary | ICD-10-CM

## 2023-03-29 DIAGNOSIS — Z794 Long term (current) use of insulin: Secondary | ICD-10-CM | POA: Diagnosis not present

## 2023-03-29 DIAGNOSIS — Z7985 Long-term (current) use of injectable non-insulin antidiabetic drugs: Secondary | ICD-10-CM

## 2023-03-29 DIAGNOSIS — E782 Mixed hyperlipidemia: Secondary | ICD-10-CM

## 2023-03-29 LAB — POCT GLYCOSYLATED HEMOGLOBIN (HGB A1C): Hemoglobin A1C: 7.5 % — AB (ref 4.0–5.6)

## 2023-03-29 MED ORDER — TRESIBA FLEXTOUCH 100 UNIT/ML ~~LOC~~ SOPN: 28.0000 [IU] | PEN_INJECTOR | Freq: Every day | SUBCUTANEOUS | 3 refills | Status: DC

## 2023-03-29 MED ORDER — SYNTHROID 112 MCG PO TABS: 112.0000 ug | ORAL_TABLET | Freq: Every day | ORAL | 1 refills | Status: DC

## 2023-03-29 NOTE — Patient Instructions (Addendum)
Goals  Don't eat after 7 pm- unless veggies. Be sure to eat more plant based foods with meals Keep walking and exercising Get A1C down to 6.5%

## 2023-03-29 NOTE — Progress Notes (Signed)
Medical Nutrition Therapy:  Appt start time: 0950 end time:  1015  Assessment:  Primary concerns today: Diabetes Type 2 x 15 years. Saw Whitney Today down to 7.5% Just on Ozempic. Been walking, eating more mindful, joined [employer] exercise class. Tresiba to increase to 28 units by [employer] Today. Admits to eating snack after supper, effecting fasting blood sugars.  Encouraged more balance complex CHO with meals and not being too restrictive of carbs at meals causing to snack after meals. Tresiba to increase to  28 units  Taking Ozempic. She has the freestyle libre. TIR 76%. 23% high, 1% low. Biggest spikes are breakfast times when she goes to [employer] with her husband and eats oatmeal and english muffin sandwich. She is motivated to exercise more and keep fine tuning her diet.  Since eating better and exercising, she doesn't need the trazodone as much and feels a lot better not taking it.   Sees Ronny Bacon, FNP at [facility] for her DM.  Lab Results  Component Value Date   HGBA1C 7.5 (A) 03/29/2023   Wt Readings from Last 3 Encounters:  03/29/23 168 lb 9.6 oz (76.5 kg)  02/28/23 171 lb 6.4 oz (77.7 kg)  01/27/23 169 lb (76.7 kg)   Ht Readings from Last 3 Encounters:  03/29/23 5' 3.5" (1.613 m)  01/27/23 5' 4.5" (1.638 m)  01/10/23 5' 3.5" (1.613 m)   There is no height or weight on file to calculate BMI. @BMIFA @ Facility age limit for growth %iles is 20 years. Facility age limit for growth %iles is 20 years.    Lab Results  Component Value Date   HGBA1C 7.5 (A) 03/29/2023      Latest Ref Rng & Units 03/22/2023    8:48 AM 11/22/2022    8:38 AM 04/13/2022    8:36 AM  CMP  Glucose 70 - 99 mg/dL 782  956  213   BUN 8 - 27 mg/dL 18  15  14    Creatinine 0.57 - 1.00 mg/dL 0.86  5.78  4.69   Sodium 134 - 144 mmol/L 140  142  139   Potassium 3.5 - 5.2 mmol/L 4.3  4.2  4.2   Chloride 96 - 106 mmol/L 102  105  103   CO2 20 - 29 mmol/L 21  21  23    Calcium 8.7 -  10.3 mg/dL 9.8  9.4  9.2   Total Protein 6.0 - 8.5 g/dL 7.1  6.8  7.1   Total Bilirubin 0.0 - 1.2 mg/dL 0.7  0.9  0.7   Alkaline Phos 44 - 121 IU/L 86  81  82   AST 0 - 40 IU/L 21  17  15    ALT 0 - 32 IU/L 32  30  26      Preferred Learning Style: No preference indicated   Learning Readiness:  Ready Change in progress  MEDICATIONS:    DIETARY INTAKE:   24-hr recall:  B ( AM): English muffin, egg whites, or oatmeal, Lunch: Chick fila sandwich water and 1/2 bun. Dinner [employer], green beans, 1 slice toast with jelly and water Snack: pack of nabs and water   Beverages: water  Usual physical activity: walking.  Estimated energy needs: 1200 calories 135 g carbohydrates 90 g protein 33  g fat  Progress Towards Goal(s):  In progress.   Nutritional Diagnosis:  NB-1.1 Food and nutrition-related knowledge deficit As related to DIabetes Type 2.  As evidenced by A1C 7.4%%    Intervention:  Meal planning, portion control, high fiber diet.  Goals  Focus on more plant based foods Exercise on treadmill 3-4 times per week for 30 minutes Cut out fast foods and junk food Cut lose 3-5 lbs per month Drink 4 bottles of water per day;   Teaching Method Utilized:  Visual Auditory Hands on  Handouts given during visit include: The Plate Method  Meal Plan Card   Barriers to learning/adherence to lifestyle change: none  Demonstrated degree of understanding via:  Teach Back   Monitoring/Evaluation:  Dietary intake, exercise, , and body weight in 3 month(s).

## 2023-03-29 NOTE — Progress Notes (Signed)
03/29/2023, 9:48 AM                            Endocrinology follow-up note  Subjective:    Patient ID: Courtney Barry, female    DOB: 05/26/1951.  Courtney Barry is being seen in follow-up for management of currently uncontrolled symptomatic diabetes requested by  Jac Canavan, PA-C.   Past Medical History:  Diagnosis Date   Diabetes mellitus without complication (HCC) 11/21/2019   Diabetes mellitus, type II (HCC)    Encounter for specialized medical examination 09/04/2013   Hyperlipidemia    Hypothyroidism    Menopausal vaginal dryness    Normal gynecologic examination 09/05/2014   Vaccine counseling 05/21/2021    Past Surgical History:  Procedure Laterality Date   ABDOMINAL HYSTERECTOMY     APPENDECTOMY     CHOLECYSTECTOMY     TONSILLECTOMY      Social History   Socioeconomic History   Marital status: Married    Spouse name: Not on file   Number of children: Not on file   Years of education: Not on file   Highest education level: Not on file  Occupational History   Not on file  Tobacco Use   Smoking status: Never   Smokeless tobacco: Never  Vaping Use   Vaping Use: Never used  Substance and Sexual Activity   Alcohol use: Never   Drug use: Never   Sexual activity: Yes    Partners: Male    Comment: 1st intercourse- 83, partners- 1, married- 44 yrs,  Other Topics Concern   Not on file  Social History Narrative   Not on file   Social Determinants of Health   Financial Resource Strain: Low Risk  (11/23/2022)   Overall Financial Resource Strain (CARDIA)    Difficulty of Paying Living Expenses: Not hard at all  Food Insecurity: No Food Insecurity (11/23/2022)   Hunger Vital Sign    Worried About Running Out of Food in the Last Year: Never true    Ran Out of Food in the Last Year: Never true  Transportation Needs: No Transportation Needs (11/23/2022)   PRAPARE -  Administrator, Civil Service (Medical): No    Lack of Transportation (Non-Medical): No  Physical Activity: Inactive (11/23/2022)   Exercise Vital Sign    Days of Exercise per Week: 0 days    Minutes of Exercise per Session: 0 min  Stress: No Stress Concern Present (11/23/2022)   [employer] of Occupational Health - Occupational Stress Questionnaire    Feeling of Stress : Not at all  Social Connections: Not on file    Family History  Problem Relation Age of Onset   Thyroid disease Mother    Cancer Mother    Pancreatic cancer Mother    Hypertension Father    Kidney disease Father    Thyroid cancer Sister        died at 46    Outpatient Encounter Medications as of 03/29/2023  Medication Sig   Accu-Chek Softclix Lancets lancets Use as instructed to monitor glucose twice daily  atorvastatin (LIPITOR) 40 MG tablet Take 1 tablet (40 mg total) by mouth daily.   Blood Glucose Monitoring Suppl (ACCU-CHEK GUIDE ME) w/Device KIT Use as directed to check blood glucose twice daily before breakfast and bed. DX E11.65   Cholecalciferol (VITAMIN D) 125 MCG (5000 UT) CAPS Take 1 capsule by mouth daily.   glucose blood (ACCU-CHEK GUIDE) test strip USE TO CHECK BLOOD GLUCOSE TWICE DAILY   ibuprofen (ADVIL) 600 MG tablet Take 1 tablet (600 mg total) by mouth in the morning and at bedtime.   Insulin Pen Needle (BD PEN NEEDLE NANO U/F) 32G X 4 MM MISC 1 Units by Does not apply route 2 (two) times daily.   loratadine (CLARITIN) 10 MG tablet Take 10 mg by mouth daily.   losartan (COZAAR) 25 MG tablet Take 1 tablet (25 mg total) by mouth daily.   Multiple Vitamin (MULTIVITAMIN) tablet Take 1 tablet by mouth daily.   Semaglutide, 1 MG/DOSE, 4 MG/3ML SOPN Inject 1 mg as directed once a week.   traZODone (DESYREL) 50 MG tablet TAKE 1/2 TO 1 TABLET BY MOUTH AT BEDTIME AS NEEDED FOR SLEEP   [DISCONTINUED] SYNTHROID 112 MCG tablet TAKE 1 TABLET BY MOUTH EVERY DAY   [DISCONTINUED] TRESIBA  FLEXTOUCH 100 UNIT/ML FlexTouch Pen Inject 25 Units into the skin at bedtime.   SYNTHROID 112 MCG tablet Take 1 tablet (112 mcg total) by mouth daily.   TRESIBA FLEXTOUCH 100 UNIT/ML FlexTouch Pen Inject 28 Units into the skin at bedtime.   [DISCONTINUED] amoxicillin-clavulanate (AUGMENTIN) 875-125 MG tablet Take 1 tablet by mouth 2 (two) times daily.   No facility-administered encounter medications on file as of 03/29/2023.    ALLERGIES: Allergies  Allergen Reactions   Latex     Gloves, band aids- causes rash   Sulfa Antibiotics Other (See Comments)    VACCINATION STATUS: Immunization History  Administered Date(s) Administered   Fluad Quad(high Dose 65+) 09/10/2020, 08/14/2021, 08/03/2022   Influenza-Unspecified 08/09/2019   PFIZER Comirnaty(Gray Top)Covid-19 Tri-Sucrose Vaccine 05/28/2021   PFIZER(Purple Top)SARS-COV-2 Vaccination 12/31/2019, 01/02/2020, 08/25/2020   Pneumococcal Conjugate-13 11/24/2020   Pneumococcal Polysaccharide-23 01/19/2022    Diabetes She presents for her follow-up diabetic visit. She has type 2 diabetes mellitus. Onset time: She was diagnosed at approximate age of 47 years. Her disease course has been improving. There are no hypoglycemic associated symptoms. Pertinent negatives for hypoglycemia include no confusion, headaches, nervousness/anxiousness, pallor, seizures or tremors. Pertinent negatives for diabetes include no chest pain, no fatigue, no polydipsia, no polyphagia, no polyuria and no weight loss. There are no hypoglycemic complications. Symptoms are stable. Diabetic complications include nephropathy and retinopathy. Risk factors for coronary artery disease include diabetes mellitus, dyslipidemia, hypertension, post-menopausal, sedentary lifestyle and obesity. Current diabetic treatment includes insulin injections (and Ozempic). She is compliant with treatment most of the time. Her weight is decreasing steadily. She is following a generally healthy  diet. When asked about meal planning, she reported none. She has not had a previous visit with a dietitian. She participates in exercise intermittently. Her home blood glucose trend is fluctuating minimally. Her breakfast blood glucose range is generally 140-180 mg/dl. Her bedtime blood glucose range is generally 180-200 mg/dl. (She presents today with her meter and logs showing slightly above target glycemic profile overall.  Her POCT A1c today is 7.5%, improving slightly from last visit of 7.7%.  Analysis of her meter shows 7-day average of 156, 14-day average of 158, 30-day average of 156, 90-day average of 153.  She just started  silver sneakers exercise program at the [facility].) An ACE inhibitor/angiotensin II receptor blocker is being taken. She does not see a podiatrist.Eye exam is current.  Hyperlipidemia This is a chronic problem. The current episode started more than 1 year ago. The problem is controlled. Recent lipid tests were reviewed and are normal. Exacerbating diseases include diabetes. There are no known factors aggravating her hyperlipidemia. Pertinent negatives include no chest pain, myalgias or shortness of breath. Current antihyperlipidemic treatment includes statins. The current treatment provides moderate improvement of lipids. There are no compliance problems.  Risk factors for coronary artery disease include diabetes mellitus, dyslipidemia, hypertension, a sedentary lifestyle and post-menopausal.  Thyroid Problem Presents for follow-up visit. Onset time: She was diagnosed with hypothyroidism in her 54s, currently taking Synthroid 112 mcg p.o. daily. Patient reports no anxiety, cold intolerance, constipation, depressed mood, diarrhea, fatigue, heat intolerance, leg swelling, palpitations, tremors, weight gain or weight loss. The symptoms have been stable. Her past medical history is significant for diabetes and hyperlipidemia.  Hypertension This is a chronic problem. The current episode  started more than 1 year ago. The problem has been resolved since onset. The problem is controlled. Pertinent negatives include no chest pain, headaches, palpitations or shortness of breath. Agents associated with hypertension include thyroid hormones. Risk factors for coronary artery disease include diabetes mellitus, dyslipidemia, family history, post-menopausal state and sedentary lifestyle. Past treatments include angiotensin blockers. The current treatment provides moderate improvement. There are no compliance problems.  Hypertensive end-organ damage includes retinopathy. Identifiable causes of hypertension include a thyroid problem.    Review of systems  Constitutional: + stable body weight,  current Body mass index is 29.4 kg/m. , + fatigue, no subjective hypothermia Eyes: no blurry vision, no xerophthalmia ENT: no sore throat, no nodules palpated in throat, no dysphagia/odynophagia, no hoarseness Cardiovascular: no chest pain, no shortness of breath, no palpitations, no leg swelling Respiratory: no cough, no shortness of breath Gastrointestinal: no nausea/vomiting/diarrhea Musculoskeletal: no muscle/joint aches Skin: no rashes, no hyperemia Neurological: no tremors, no numbness, no tingling, no dizziness Psychiatric: no depression, no anxiety   Objective:    BP 111/72 (BP Location: Left Arm, Patient Position: Sitting, Cuff Size: Large)   Pulse 83   Ht 5' 3.5" (1.613 m)   Wt 168 lb 9.6 oz (76.5 kg)   BMI 29.40 kg/m   Wt Readings from Last 3 Encounters:  03/29/23 168 lb 9.6 oz (76.5 kg)  02/28/23 171 lb 6.4 oz (77.7 kg)  01/27/23 169 lb (76.7 kg)    BP Readings from Last 3 Encounters:  03/29/23 111/72  02/28/23 120/70  01/27/23 132/64     Physical Exam- Limited  Constitutional:  Body mass index is 29.4 kg/m. , not in acute distress, normal state of mind Eyes:  EOMI, no exophthalmos Musculoskeletal: no gross deformities, strength intact in all four extremities, no  gross restriction of joint movements Skin:  no rashes, no hyperemia Neurological: no tremor with outstretched hands     Recent Results (from the past 2160 hour(s))  POC COVID-19     Status: Abnormal   Collection Time: 01/10/23 11:09 AM  Result Value Ref Range   SARS Coronavirus 2 Ag Positive (A) Negative  Influenza A/B     Status: None   Collection Time: 01/10/23 11:09 AM  Result Value Ref Range   Influenza A, POC Negative Negative   Influenza B, POC Negative Negative  CBC     Status: Abnormal   Collection Time: 01/27/23 10:12 AM  Result  Value Ref Range   WBC 4.7 3.4 - 10.8 x10E3/uL   RBC 5.20 3.77 - 5.28 x10E6/uL   Hemoglobin 13.3 11.1 - 15.9 g/dL   Hematocrit 16.1 09.6 - 46.6 %   MCV 81 79 - 97 fL   MCH 25.6 (L) 26.6 - 33.0 pg   MCHC 31.7 31.5 - 35.7 g/dL   RDW 04.5 40.9 - 81.1 %   Platelets 202 150 - 450 x10E3/uL  Lipid panel     Status: None   Collection Time: 01/27/23 10:12 AM  Result Value Ref Range   Cholesterol, Total 116 100 - 199 mg/dL   Triglycerides 60 0 - 149 mg/dL   HDL 47 >91 mg/dL   VLDL Cholesterol Cal 13 5 - 40 mg/dL   LDL Chol Calc (NIH) 56 0 - 99 mg/dL   Chol/HDL Ratio 2.5 0.0 - 4.4 ratio    Comment:                                   T. Chol/HDL Ratio                                             Men  Women                               1/2 Avg.Risk  3.4    3.3                                   Avg.Risk  5.0    4.4                                2X Avg.Risk  9.6    7.1                                3X Avg.Risk 23.4   11.0   Microalbumin/Creatinine Ratio, Urine     Status: None   Collection Time: 01/27/23 10:12 AM  Result Value Ref Range   Creatinine, Urine 115.8 Not Estab. mg/dL   Microalbumin, Urine <4.7 Not Estab. ug/mL   Microalb/Creat Ratio <3 0 - 29 mg/g creat    Comment:                        Normal:                0 -  29                        Moderately increased: 30 - 300                        Severely increased:       >300   POCT  Urinalysis DIP (Proadvantage Device)     Status: Normal   Collection Time: 01/27/23 10:55 AM  Result Value Ref Range   Color, UA yellow yellow   Clarity, UA clear clear   Glucose, UA negative negative mg/dL   Bilirubin, UA negative negative  Ketones, POC UA negative negative mg/dL   Specific Gravity, Urine 1.020    Blood, UA negative negative   pH, UA 6.0 5.0 - 8.0   Protein Ur, POC negative negative mg/dL   Urobilinogen, Ur n    Nitrite, UA Negative Negative   Leukocytes, UA Negative Negative  HM DIABETES EYE EXAM     Status: None   Collection Time: 03/14/23 12:00 AM  Result Value Ref Range   HM Diabetic Eye Exam No Retinopathy No Retinopathy  Comprehensive metabolic panel     Status: Abnormal   Collection Time: 03/22/23  8:48 AM  Result Value Ref Range   Glucose 151 (H) 70 - 99 mg/dL   BUN 18 8 - 27 mg/dL   Creatinine, Ser 1.61 0.57 - 1.00 mg/dL   eGFR 94 >09 UE/AVW/0.98   BUN/Creatinine Ratio 26 12 - 28   Sodium 140 134 - 144 mmol/L   Potassium 4.3 3.5 - 5.2 mmol/L   Chloride 102 96 - 106 mmol/L   CO2 21 20 - 29 mmol/L   Calcium 9.8 8.7 - 10.3 mg/dL   Total Protein 7.1 6.0 - 8.5 g/dL   Albumin 4.4 3.9 - 4.9 g/dL   Globulin, Total 2.7 1.5 - 4.5 g/dL   Albumin/Globulin Ratio 1.6 1.2 - 2.2   Bilirubin Total 0.7 0.0 - 1.2 mg/dL   Alkaline Phosphatase 86 44 - 121 IU/L   AST 21 0 - 40 IU/L   ALT 32 0 - 32 IU/L  TSH     Status: None   Collection Time: 03/22/23  8:48 AM  Result Value Ref Range   TSH 0.697 0.450 - 4.500 uIU/mL  T4, free     Status: None   Collection Time: 03/22/23  8:48 AM  Result Value Ref Range   Free T4 1.57 0.82 - 1.77 ng/dL  HgB J1B     Status: Abnormal   Collection Time: 03/29/23  9:34 AM  Result Value Ref Range   Hemoglobin A1C 7.5 (A) 4.0 - 5.6 %   HbA1c POC (<> result, manual entry)     HbA1c, POC (prediabetic range)     HbA1c, POC (controlled diabetic range)         Assessment & Plan:   1) Uncontrolled type 2 diabetes mellitus with  hyperglycemia (HCC)  - Courtney Barry has currently controlled asymptomatic type 2 DM since  72 years of age.  She presents today with her meter and logs showing slightly above target glycemic profile overall.  Her POCT A1c today is 7.5%, improving slightly from last visit of 7.7%.  Analysis of her meter shows 7-day average of 156, 14-day average of 158, 30-day average of 156, 90-day average of 153.  She just started silver sneakers exercise program at the [facility].  -her diabetes is complicated by retinopathy obesity/sedentary life and she remains at a high risk for more acute and chronic complications which include CAD, CVA, CKD, retinopathy, and neuropathy. These are all discussed in detail with her.  - Nutritional counseling repeated at each appointment due to patients tendency to fall back in to old habits.  - The patient admits there is a room for improvement in their diet and drink choices. -  Suggestion is made for the patient to avoid simple carbohydrates from their diet including Cakes, Sweet Desserts / Pastries, Ice Cream, Soda (diet and regular), Sweet Tea, Candies, Chips, Cookies, Sweet Pastries, Store Bought Juices, Alcohol in Excess of 1-2 drinks a day, Artificial Sweeteners, Coffee  Creamer, and "Sugar-free" Products. This will help patient to have stable blood glucose profile and potentially avoid unintended weight gain.   - I encouraged the patient to switch to unprocessed or minimally processed complex starch and increased protein intake (animal or plant source), fruits, and vegetables.   - Patient is advised to stick to a routine mealtimes to eat 3 meals a day and avoid unnecessary snacks (to snack only to correct hypoglycemia).  - I have approached her with the following individualized plan to manage  her diabetes and patient agrees:   -She will continue to benefit from simplified treatment regimen, given her tight control of diabetes.    -She is advised to increase her  Tresiba slightly to 28 units SQ nightly and continue her Ozempic 1 mg SQ weekly.  Her GI symptoms are much improved off the Metformin.  She can stay off that for now.  -She is encouraged to routinely fingerstick twice daily, before breakfast and before bed, and to call the clinic if she has readings less than 70 or above 300 for 3 tests in a row.  She did not tolerate CGM (adhesive allergy).  - She has allergy to sulfa medications, therefore not a candidate for glipizide.  - Specific targets for  A1c;  LDL, HDL, Triglycerides,  were discussed with the patient.  2) Blood Pressure /Hypertension:  Her blood pressure is controlled to target.  She is advised to continue Losartan 25 mg po daily.  3) Lipids/Hyperlipidemia:  Her most recent lipid panel from 04/13/22 shows controlled LDL at 50.  She is advised to continue Lipitor 40 mg po daily at bedtime.  Side effects and precautions discussed with her.    4) Hypothyroidism- longstanding diagnosis since at least from her 94s.  Her previsit thyroid function tests are consistent with appropriate hormone replacement.  She is advised to continue Synthroid 112 mcg po daily before breakfast.  She does have family history of thyroid cancer (she is unsure of which type).  This may help determine her risk factor and if she needs to stop GLP1 therapy in the future.   - We discussed about the correct intake of her thyroid hormone, on empty stomach at fasting, with water, separated by at least 30 minutes from breakfast and other medications,  and separated by more than 4 hours from calcium, iron, multivitamins, acid reflux medications (PPIs). -Patient is made aware of the fact that thyroid hormone replacement is needed for life, dose to be adjusted by periodic monitoring of thyroid function tests.  5)  Weight/Diet:  Her Body mass index is 29.4 kg/m.-   she is a candidate for some weight loss. I discussed with her the fact that loss of 5 - 10% of her  current body  weight will have the most impact on her diabetes management.  Exercise, and detailed carbohydrates information provided  -  detailed on discharge instructions.  6) Chronic Care/Health Maintenance: -she is on ACEI/ARB and Statin medications and is encouraged to initiate and continue to follow up with Ophthalmology, Dentist, Podiatrist at least yearly or according to recommendations, and advised to stay away from smoking. I have recommended yearly flu vaccine and pneumonia vaccine at least every 5 years; moderate intensity exercise for up to 150 minutes weekly; and sleep for at least 7 hours a day.  - she is advised to maintain close follow up with Tysinger, Kermit Balo, PA-C for primary care needs, as well as her other providers for optimal and coordinated care.  I spent  38  minutes in the care of the patient today including review of labs from CMP, Lipids, Thyroid Function, Hematology (current and previous including abstractions from other facilities); face-to-face time discussing  her blood glucose readings/logs, discussing hypoglycemia and hyperglycemia episodes and symptoms, medications doses, her options of short and long term treatment based on the latest standards of care / guidelines;  discussion about incorporating lifestyle medicine;  and documenting the encounter. Risk reduction counseling performed per USPSTF guidelines to reduce obesity and cardiovascular risk factors.     Please refer to Patient Instructions for Blood Glucose Monitoring and Insulin/Medications Dosing Guide"  in media tab for additional information. Please  also refer to " Patient Self Inventory" in the Media  tab for reviewed elements of pertinent patient history.  Courtney Barry participated in the discussions, expressed understanding, and voiced agreement with the above plans.  All questions were answered to her satisfaction. she is encouraged to contact clinic should she have any questions or concerns prior to her  return visit.    Follow up plan: - Return in about 4 months (around 07/30/2023) for Diabetes F/U with A1c in office, No previsit labs, Bring meter and logs.   Ronny Bacon, [facility] [address] [place], [place] [address] Phone: [phone] Fax: [phone]  03/29/2023, 9:48 AM

## 2023-03-31 ENCOUNTER — Ambulatory Visit: Payer: Medicare PPO | Admitting: Nurse Practitioner

## 2023-04-02 ENCOUNTER — Other Ambulatory Visit: Payer: Self-pay | Admitting: Medical

## 2023-04-02 DIAGNOSIS — F5101 Primary insomnia: Secondary | ICD-10-CM

## 2023-04-18 ENCOUNTER — Ambulatory Visit (AMBULATORY_SURGERY_CENTER): Payer: Medicare PPO

## 2023-04-18 VITALS — Ht 64.0 in | Wt 168.0 lb

## 2023-04-18 DIAGNOSIS — Z1211 Encounter for screening for malignant neoplasm of colon: Secondary | ICD-10-CM

## 2023-04-18 MED ORDER — CLENPIQ 10-3.5-12 MG-GM -GM/160ML PO SOLN: 1.0000 | ORAL | 0 refills | Status: DC

## 2023-04-18 NOTE — Progress Notes (Signed)
Pre visit completed via phone call; Patient verified name, DOB, and address;  No egg or soy allergy known to patient;  No issues known to pt with past sedation with any surgeries or procedures; Patient denies ever being told they had issues or difficulty with intubation;  No FH of Malignant Hyperthermia; Pt is not on diet pills; Pt is not on home 02;  Pt is not on blood thinners;  Pt denies issues with constipation;  No A fib or A flutter; Have any cardiac testing pending--NO Pt instructed to use Singlecare.com or GoodRx for a price reduction on prep   Insurance verified during PV appt=[employer]  Patient's chart reviewed by Cathlyn Parsons CNRA prior to previsit and patient appropriate for the [facility].  Previsit completed and red dot placed by patient's name on their procedure day (on provider's schedule).    RxBIN:  [id number]  RxPCN: [id number]  RxGrp:  [id number] RxID: [id number]   CLENPIQ coupon information placed in RX;   RX printed and faxed to the pharmacy;   Instructions obtained by the patient via MyChart per her request as she reports she has a printer available to her;

## 2023-04-20 ENCOUNTER — Telehealth: Payer: Self-pay | Admitting: Gastroenterology

## 2023-04-20 NOTE — Telephone Encounter (Signed)
Called and spoke with pharmacy- pharmacy needed updated RX dosage to instead of as previously noted to be for this RX; verbal order given; pharmacist reports Clenpiq will be ready for pick up and the cost is $40.00 for the patient;  Called and spoke with patient- updated patient on previous discussion and she reports she will pick up the prep today, patient appreciative of information;  Patient advised to call back to the office at [phone] should questions/concerns arise;   Patient verbalized understanding of information/instructions;

## 2023-04-20 NOTE — Telephone Encounter (Signed)
Patient called stated [facility] does not have the prep medication please send alternative. Call the patient once done so she knows.

## 2023-04-21 ENCOUNTER — Telehealth: Payer: Self-pay | Admitting: Medical

## 2023-04-21 NOTE — Telephone Encounter (Signed)
Pt called and states that the knot is returning. She states that she was told that she may need to be referred to a ENT. She thinks that it might be time for that. Pt can be reached at [phone].

## 2023-04-22 ENCOUNTER — Ambulatory Visit: Payer: Medicare PPO | Admitting: Medical

## 2023-04-22 VITALS — BP 120/70 | HR 97 | Temp 97.3°F | Wt 170.8 lb

## 2023-04-22 DIAGNOSIS — Q381 Ankyloglossia: Secondary | ICD-10-CM

## 2023-04-22 DIAGNOSIS — I889 Nonspecific lymphadenitis, unspecified: Secondary | ICD-10-CM

## 2023-04-22 LAB — CBC WITH DIFFERENTIAL/PLATELET
Basophils Absolute: 0 10*3/uL (ref 0.0–0.2)
Basos: 0 %
EOS (ABSOLUTE): 0.1 10*3/uL (ref 0.0–0.4)
Eos: 1 %
Hematocrit: 40.3 % (ref 34.0–46.6)
Hemoglobin: 12.8 g/dL (ref 11.1–15.9)
Immature Grans (Abs): 0 10*3/uL (ref 0.0–0.1)
Immature Granulocytes: 0 %
Lymphocytes Absolute: 2.1 10*3/uL (ref 0.7–3.1)
Lymphs: 44 %
MCH: 26.2 pg — ABNORMAL LOW (ref 26.6–33.0)
MCHC: 31.8 g/dL (ref 31.5–35.7)
MCV: 82 fL (ref 79–97)
Monocytes Absolute: 0.3 10*3/uL (ref 0.1–0.9)
Monocytes: 7 %
Neutrophils Absolute: 2.3 10*3/uL (ref 1.4–7.0)
Neutrophils: 48 %
Platelets: 197 10*3/uL (ref 150–450)
RBC: 4.89 x10E6/uL (ref 3.77–5.28)
RDW: 13.5 % (ref 11.7–15.4)
WBC: 4.8 10*3/uL (ref 3.4–10.8)

## 2023-04-22 MED ORDER — NAPROXEN 375 MG PO TABS: 375.0000 mg | ORAL_TABLET | Freq: Two times a day (BID) | ORAL | 0 refills | Status: DC

## 2023-04-22 MED ORDER — TRIAMCINOLONE ACETONIDE 0.1 % MT PSTE: 1.0000 | PASTE | Freq: Two times a day (BID) | OROMUCOSAL | 0 refills | Status: DC

## 2023-04-22 NOTE — Telephone Encounter (Signed)
Pt is coming in this afternoon

## 2023-04-22 NOTE — Patient Instructions (Addendum)
Encounter Diagnoses  Name Primary?   Lymphadenitis Yes   Lingual frenum    Your lymph nodes tendnerss suggests inflammation or recent viral infection   Recommendations: Begin Naprosyn 1 tablet once or twice daily for 3-5 days for pain and inflammation of the lymph node and lingual frenum under the tongue Such on ice or cold ice chips to help with inflammation of the frenum Use salt water rinses in the mouth over the weekend  Both of the symptoms, inflamed lymph and irritated frenum should resolve in a week or 2

## 2023-04-22 NOTE — Progress Notes (Signed)
Subjective:  Courtney Barry is a 71 y.o. female who presents for Chief Complaint  Patient presents with   knot on chin    Knot on chin, went away after antibiotics then come back and yesterday had sharp pain go through the knot. Having pain with swallowing and eating food. Been taken flonase and claritin and will help until she gets ready to eat. Dentist took pictures of teeth and nothing going on with teeth     Here for recheck on knot under the chin.  She was seen here in April for similar.  It got better with antibiotics in the late part of April after she continued to have concern.  Things are relatively okay until yesterday had a sharp pain in her lymph node and irritation with eating.  She saw dentist recently saw nothing worrisome from a dental standpoint  She also has a tender swollen area under the tongue.  No fever.  No body aches or chills.  No recent cough or cold symptoms.  No sinus pressure.  No ear pain.  No cough.  No other aggravating or relieving factors.    No other c/o.  Past Medical History:  Diagnosis Date   Arthritis    generalized- RIGHT hip   Diabetes mellitus, type II (HCC)    Encounter for specialized medical examination 09/04/2013   Hyperlipidemia    on meds   Hypertension    on meds   Hypothyroidism    on meds   Menopausal vaginal dryness    Normal gynecologic examination 09/05/2014   Seasonal allergies    Vaccine counseling 05/21/2021   Current Outpatient Medications on File Prior to Visit  Medication Sig Dispense Refill   atorvastatin (LIPITOR) 40 MG tablet Take 1 tablet (40 mg total) by mouth daily. 90 tablet 3   Cholecalciferol (VITAMIN D) 125 MCG (5000 UT) CAPS Take 1 capsule by mouth daily.     estradiol (ESTRACE VAGINAL) 0.1 MG/GM vaginal cream Place 1 Applicatorful vaginally daily as needed.     loratadine (CLARITIN) 10 MG tablet Take 10 mg by mouth daily as needed for allergies, rhinitis or itching.     losartan (COZAAR) 25 MG tablet  Take 1 tablet (25 mg total) by mouth daily. 90 tablet 3   Multiple Vitamin (MULTIVITAMIN) tablet Take 1 tablet by mouth daily.     Semaglutide, 1 MG/DOSE, 4 MG/3ML SOPN Inject 1 mg as directed once a week. 6 mL 3   SYNTHROID 112 MCG tablet Take 1 tablet (112 mcg total) by mouth daily. 90 tablet 1   traZODone (DESYREL) 50 MG tablet TAKE 1/2 TO 1 TABLET BY MOUTH AT BEDTIME AS NEEDED FOR SLEEP 90 tablet 0   TRESIBA FLEXTOUCH 100 UNIT/ML FlexTouch Pen Inject 28 Units into the skin at bedtime. 25 mL 3   Accu-Chek Softclix Lancets lancets Use as instructed to monitor glucose twice daily 100 each 12   Blood Glucose Monitoring Suppl (ACCU-CHEK GUIDE ME) w/Device KIT Use as directed to check blood glucose twice daily before breakfast and bed. DX E11.65 1 kit 0   glucose blood (ACCU-CHEK GUIDE) test strip USE TO CHECK BLOOD GLUCOSE TWICE DAILY 100 strip 2   Insulin Pen Needle (BD PEN NEEDLE NANO U/F) 32G X 4 MM MISC 1 Units by Does not apply route 2 (two) times daily. 100 each 3   Sod Picosulfate-Mag Ox-Cit Acd (CLENPIQ) 10-3.5-12 MG-GM -GM/160ML SOLN Take 1 kit by mouth as directed. RxBIN: [id number]  RxPCN: [facility]  RxGrp:[id number]  RxID: [id number] 320 mL 0   No current facility-administered medications on file prior to visit.    The following portions of the patient's history were reviewed and updated as appropriate: allergies, current medications, past family history, past medical history, past social history, past surgical history and problem list.  ROS Otherwise as in subjective above  Objective: BP 120/70   Pulse 97   Temp (!) 97.3 F (36.3 C)   Wt 170 lb 12.8 oz (77.5 kg)   SpO2 98%   BMI 29.32 kg/m   General appearance: alert, no distress, well developed, well nourished HEENT: normocephalic, sclerae anicteric, conjunctiva pink and moist, TMs pearly, nares patent, some clear discharge, no erythema, pharynx normal Oral cavity: MMM, tender swollen lingual frenum mid stalk under tongue, no  other lesion Neck: supple, shotty tender lymph node on the left submandibular region, otherwise no other lymphadenopathy, no thyromegaly, no masses    Assessment: Encounter Diagnoses  Name Primary?   Lymphadenitis Yes   Lingual frenum       Plan: We discussed her symptoms and exam findings.  There is no significant lymphadenopathy or nodules suggesting worrisome tumor or lymphoma.  Patient Instructions   Encounter Diagnoses  Name Primary?   Lymphadenitis Yes   Lingual frenum    Your lymph nodes tendnerss suggests inflammation or recent viral infection   Recommendations: Begin Naprosyn 1 tablet once or twice daily for 3-5 days for pain and inflammation of the lymph node and lingual frenum under the tongue Such on ice or cold ice chips to help with inflammation of the frenum Use salt water rinses in the mouth over the weekend  Both of the symptoms, inflamed lymph and irritated frenum should resolve in a week or 2    Courtney Barry was seen today for knot on chin.  Diagnoses and all orders for this visit:  Lymphadenitis -     CBC with Differential/Platelet  Lingual frenum  Other orders -     triamcinolone (KENALOG) 0.1 % paste; Use as directed 1 Application in the mouth or throat 2 (two) times daily. -     naproxen (NAPROSYN) 375 MG tablet; Take 1 tablet (375 mg total) by mouth 2 (two) times daily with a meal.     Follow up: pending lab

## 2023-04-24 IMAGING — US US SOFT TISSUE HEAD/NECK
1 series · 10 of 10 positions shown · non-contrast
Comparison: None.

CLINICAL DATA: Palpable abnormality involving the right-side of the
submental portion of the neck for the past 4 months.

EXAM:
ULTRASOUND OF HEAD/NECK SOFT TISSUES
TECHNIQUE: Ultrasound examination of the head and neck soft tissues was
performed in the area of clinical concern.

[Series 1: us soft tissue head/neck · 0.05mm/px · 10 acquisitions, 10 frames shown]
[im 1/10]
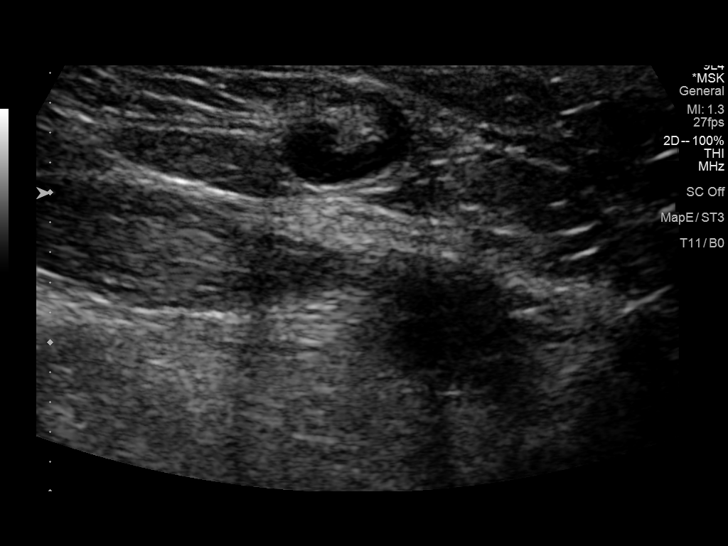
[im 2/10]
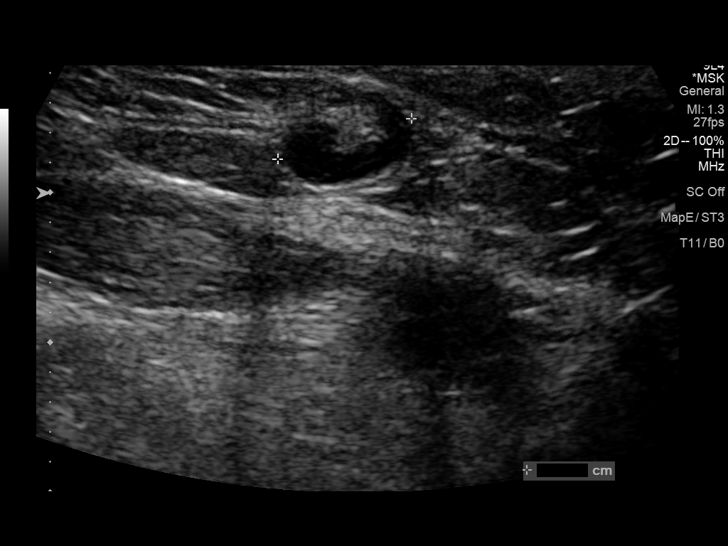
[im 3/10]
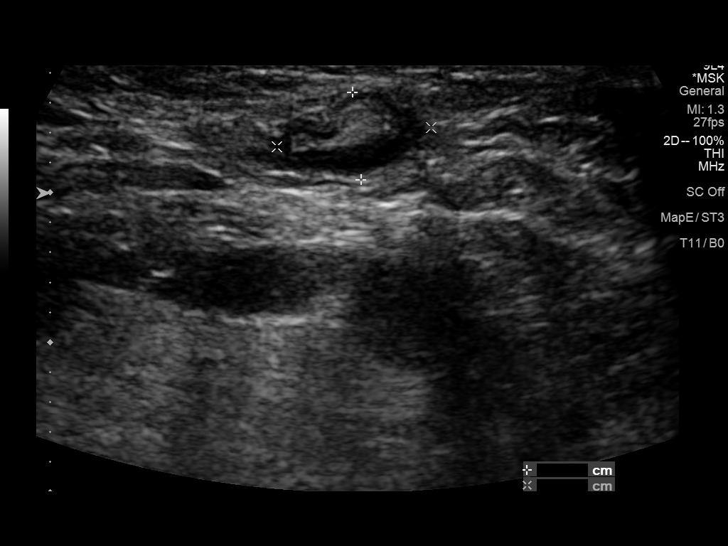
[im 4/10]
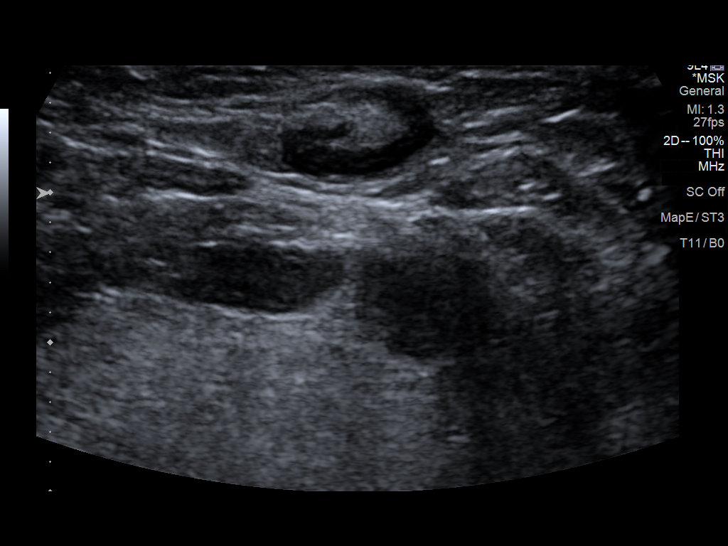
[im 5/10]
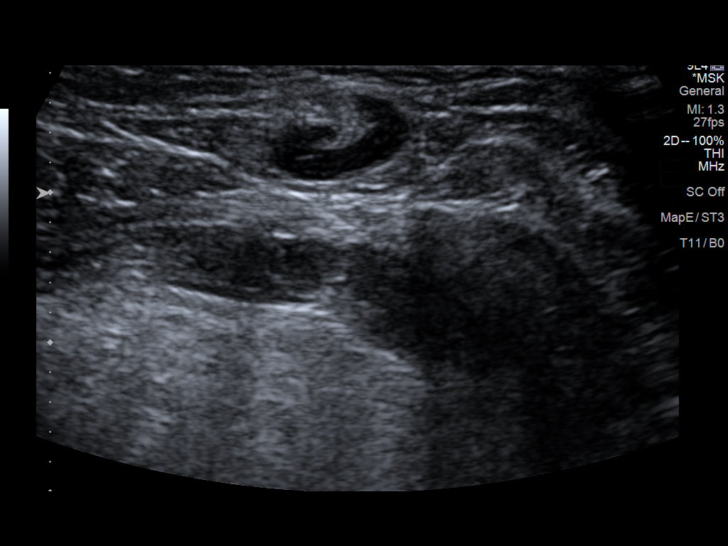
[im 6/10]
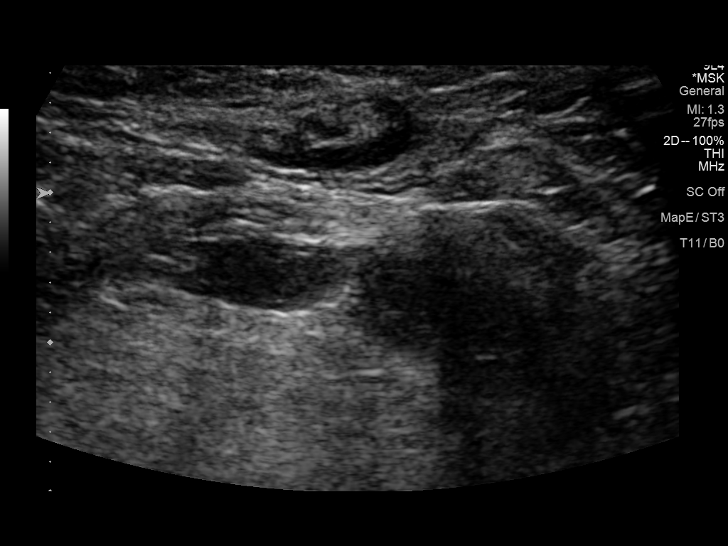
[im 7/10]
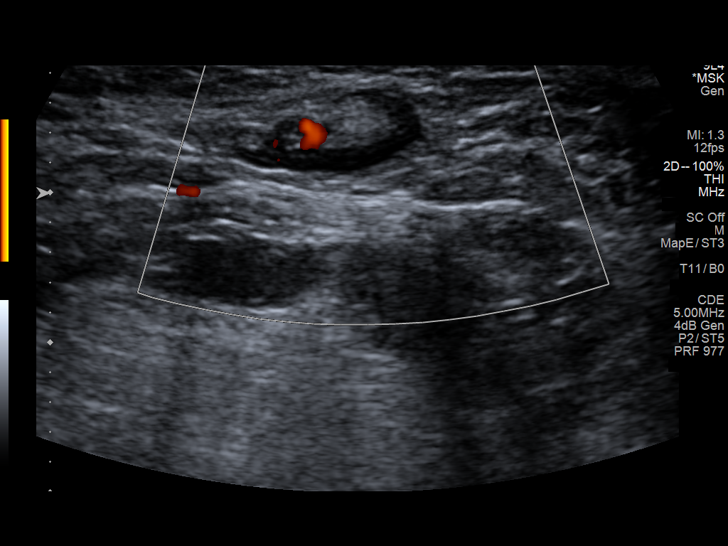
[im 8/10]
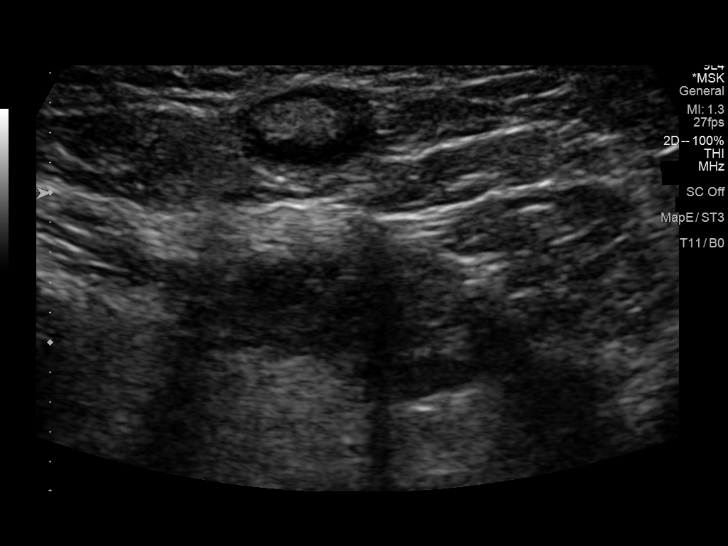
[im 9/10]
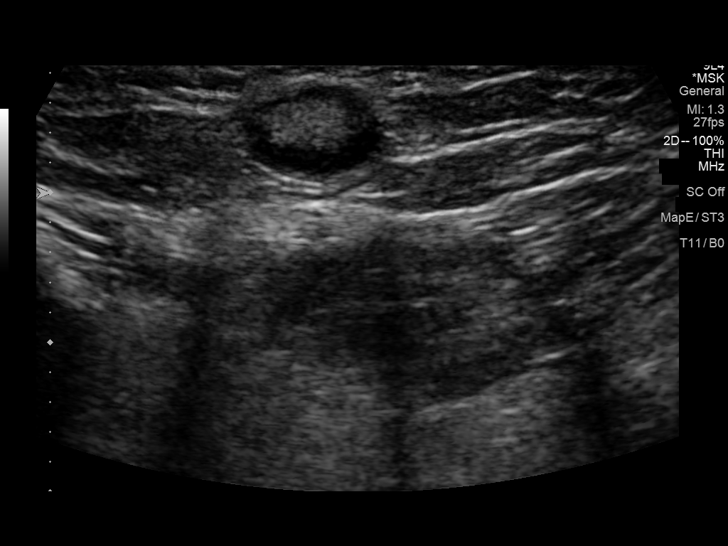
[im 10/10]
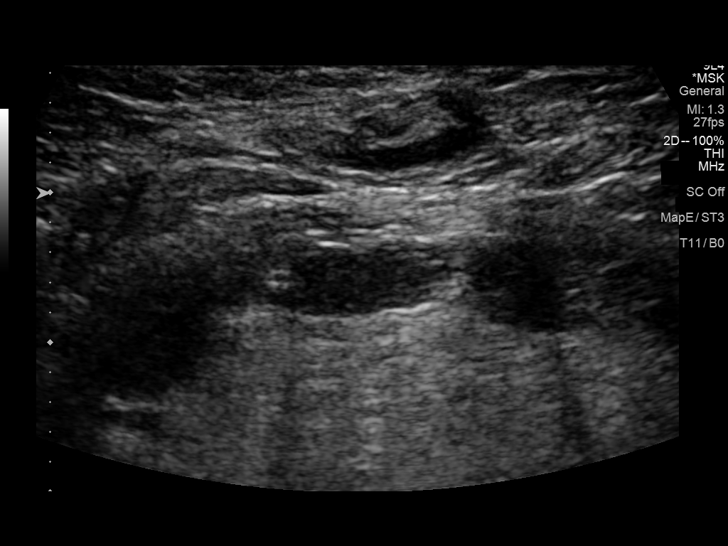

[10 of 10 positions shown; findings below may reference images not displayed]

FINDINGS: Sonographic evaluation of the patient's palpable area of concern
involving the right-side of the submental portion of the neck
correlates with a solitary mildly prominent though non
pathologically enlarged and benign-appearing submental lymph node.
The lymph node is not enlarged by size criteria measuring 0.6 cm in
greatest short axis diameter and maintains a benign fatty hilum
without definitive cortical thickening (image 4).

Otherwise, there is no sonographic correlate for patient's palpable
area of concern. Specifically, no discrete solid or cystic lesions.
IMPRESSION: Patient's palpable area of concern involving the right-side of the
submental portion of the neck correlates with a mildly prominent
though non pathologically enlarged and benign appearing submental
lymph node, nonspecific though presumably reactive in etiology.

## 2023-04-25 NOTE — Progress Notes (Signed)
Results sent through MyChart

## 2023-04-29 ENCOUNTER — Encounter: Payer: Self-pay | Admitting: Gastroenterology

## 2023-05-16 ENCOUNTER — Ambulatory Visit: Payer: Medicare PPO | Admitting: Gastroenterology

## 2023-05-16 MED ORDER — CLENPIQ 10-3.5-12 MG-GM -GM/160ML PO SOLN
1.0000 | ORAL | 0 refills | Status: DC
Start: 2023-05-16 — End: 2023-06-09

## 2023-05-16 MED ORDER — SODIUM CHLORIDE 0.9 % IV SOLN
500.0000 mL | Freq: Once | INTRAVENOUS | Status: DC
Start: 2023-05-16 — End: 2023-05-16

## 2023-05-16 NOTE — Progress Notes (Signed)
Pt ate a regular diet 05/15/23 and took Ozempic on 05/10/23.per pt her stools look like apple sauce.  MD made aware and procedure re-scheduled.

## 2023-05-17 ENCOUNTER — Other Ambulatory Visit: Payer: Self-pay | Admitting: Nurse Practitioner

## 2023-05-18 ENCOUNTER — Other Ambulatory Visit: Payer: Self-pay | Admitting: Nurse Practitioner

## 2023-06-01 ENCOUNTER — Other Ambulatory Visit: Payer: Self-pay

## 2023-06-01 ENCOUNTER — Encounter: Payer: Self-pay | Admitting: Gastroenterology

## 2023-06-01 DIAGNOSIS — Z1211 Encounter for screening for malignant neoplasm of colon: Secondary | ICD-10-CM

## 2023-06-01 MED ORDER — CLENPIQ 10-3.5-12 MG-GM -GM/160ML PO SOLN
1.0000 | ORAL | 0 refills | Status: DC
Start: 2023-06-01 — End: 2023-06-09

## 2023-06-08 ENCOUNTER — Other Ambulatory Visit: Payer: Self-pay | Admitting: Gastroenterology

## 2023-06-08 DIAGNOSIS — Z1211 Encounter for screening for malignant neoplasm of colon: Secondary | ICD-10-CM

## 2023-06-09 ENCOUNTER — Other Ambulatory Visit: Payer: Self-pay

## 2023-06-09 DIAGNOSIS — Z1211 Encounter for screening for malignant neoplasm of colon: Secondary | ICD-10-CM

## 2023-06-23 ENCOUNTER — Encounter: Payer: Self-pay | Admitting: Gastroenterology

## 2023-07-05 ENCOUNTER — Ambulatory Visit: Payer: Medicare PPO | Admitting: Family Medicine

## 2023-07-05 ENCOUNTER — Encounter: Payer: Self-pay | Admitting: Family Medicine

## 2023-07-05 VITALS — BP 120/58 | HR 88 | Temp 97.7°F | Resp 16 | Wt 170.0 lb

## 2023-07-05 DIAGNOSIS — W57XXXA Bitten or stung by nonvenomous insect and other nonvenomous arthropods, initial encounter: Secondary | ICD-10-CM | POA: Diagnosis not present

## 2023-07-05 DIAGNOSIS — S0006XA Insect bite (nonvenomous) of scalp, initial encounter: Secondary | ICD-10-CM | POA: Diagnosis not present

## 2023-07-05 MED ORDER — TRIAMCINOLONE ACETONIDE 0.1 % EX CREA
1.0000 | TOPICAL_CREAM | Freq: Two times a day (BID) | CUTANEOUS | 0 refills | Status: DC
Start: 2023-07-05 — End: 2023-08-29

## 2023-07-05 NOTE — Progress Notes (Signed)
   Subjective:    Patient ID: Courtney Barry, female    DOB: Sep 01, 1951, 72 y.o.   MRN: [id number]  HPI She states that on Thursday she was bitten on her right lower extremity by an insect and since then has had some itching and slight discomfort in the areas where she got bit.   Review of Systems     Objective:    Physical Exam Exam of the right lower extremity does show to areas that seem to be healing with some slight pigmentation associated but no erythema warmth and minimal tenderness.  Insect bite of scalp, initial encounter - Plan: triamcinolone cream (KENALOG) 0.1 % Also recommend cold compresses to the area.

## 2023-07-06 ENCOUNTER — Encounter: Payer: Medicare PPO | Admitting: Gastroenterology

## 2023-07-11 ENCOUNTER — Ambulatory Visit (AMBULATORY_SURGERY_CENTER): Payer: Medicare PPO | Admitting: Gastroenterology

## 2023-07-11 ENCOUNTER — Encounter: Payer: Self-pay | Admitting: Gastroenterology

## 2023-07-11 VITALS — BP 137/65 | HR 67 | Temp 96.8°F | Resp 12 | Ht 64.0 in | Wt 168.0 lb

## 2023-07-11 DIAGNOSIS — D122 Benign neoplasm of ascending colon: Secondary | ICD-10-CM | POA: Diagnosis not present

## 2023-07-11 DIAGNOSIS — D124 Benign neoplasm of descending colon: Secondary | ICD-10-CM | POA: Diagnosis not present

## 2023-07-11 DIAGNOSIS — Z1211 Encounter for screening for malignant neoplasm of colon: Secondary | ICD-10-CM | POA: Diagnosis not present

## 2023-07-11 DIAGNOSIS — E039 Hypothyroidism, unspecified: Secondary | ICD-10-CM | POA: Diagnosis not present

## 2023-07-11 DIAGNOSIS — I10 Essential (primary) hypertension: Secondary | ICD-10-CM | POA: Diagnosis not present

## 2023-07-11 DIAGNOSIS — E119 Type 2 diabetes mellitus without complications: Secondary | ICD-10-CM | POA: Diagnosis not present

## 2023-07-11 MED ORDER — SODIUM CHLORIDE 0.9 % IV SOLN
500.0000 mL | Freq: Once | INTRAVENOUS | Status: DC
Start: 2023-07-11 — End: 2023-07-11

## 2023-07-11 NOTE — Patient Instructions (Signed)

## 2023-07-11 NOTE — Progress Notes (Signed)
To pacu, VSS. Report to Rn.tb 

## 2023-07-11 NOTE — Progress Notes (Signed)
[facility] History and Physical   Primary Care Physician:  Jac Canavan, PA-C   Reason for Procedure:    CRC screening  Plan:     colonoscopy     HPI: Courtney Barry is a 72 y.o. female    Past Medical History:  Diagnosis Date   Arthritis    generalized- RIGHT hip   Diabetes mellitus, type II (HCC)    Encounter for specialized medical examination 09/04/2013   Hyperlipidemia    on meds   Hypertension    on meds   Hypothyroidism    on meds   Menopausal vaginal dryness    Normal gynecologic examination 09/05/2014   Seasonal allergies    Vaccine counseling 05/21/2021    Past Surgical History:  Procedure Laterality Date   ABDOMINAL HYSTERECTOMY     APPENDECTOMY     CHOLECYSTECTOMY     COLONOSCOPY  2014   Dr.Richardson-hems/tics/abnormal rectal exam-good prep   TONSILLECTOMY      Prior to Admission medications   Medication Sig Start Date End Date Taking? Authorizing Provider  Accu-Chek Softclix Lancets lancets Use as instructed to monitor glucose twice daily 07/27/22   Dani Gobble, NP  atorvastatin (LIPITOR) 40 MG tablet Take 1 tablet (40 mg total) by mouth daily. 08/03/22   Tysinger, Kermit Balo, PA-C  Blood Glucose Monitoring Suppl (ACCU-CHEK GUIDE ME) w/Device KIT Use as directed to check blood glucose twice daily before breakfast and bed. DX E11.65 12/30/20   Dani Gobble, NP  Cholecalciferol (VITAMIN D) 125 MCG (5000 UT) CAPS Take 1 capsule by mouth daily.    [provider]  estradiol (ESTRACE VAGINAL) 0.1 MG/GM vaginal cream Place 1 Applicatorful vaginally daily as needed. 05/02/19   [provider]  glucose blood (ACCU-CHEK GUIDE) test strip USE TO CHECK BLOOD GLUCOSE TWICE DAILY 05/18/23   Dani Gobble, NP  Insulin Pen Needle (BD PEN NEEDLE NANO U/F) 32G X 4 MM MISC 1 Units by Does not apply route 2 (two) times daily. 03/29/22   Jake Shark, PA-C  loratadine (CLARITIN) 10 MG tablet Take 10 mg by mouth daily  as needed for allergies, rhinitis or itching.    [provider]  losartan (COZAAR) 25 MG tablet Take 1 tablet (25 mg total) by mouth daily. 08/03/22   Tysinger, Kermit Balo, PA-C  Multiple Vitamin (MULTIVITAMIN) tablet Take 1 tablet by mouth daily.    [provider]  naproxen (NAPROSYN) 375 MG tablet Take 1 tablet (375 mg total) by mouth 2 (two) times daily with a meal. 04/22/23   Tysinger, Kermit Balo, PA-C  Semaglutide, 1 MG/DOSE, (OZEMPIC, 1 MG/DOSE,) 4 MG/3ML SOPN INJECT 1 MG ONCE A WEEK AS DIRECTED 05/18/23   Dani Gobble, NP  Sod Picosulfate-Mag Ox-Cit Acd (CLENPIQ) 10-3.5-12 MG-GM -GM/160ML SOLN TAKE AS DIRECTED 06/09/23   Lynann Bologna, MD  SYNTHROID 112 MCG tablet Take 1 tablet (112 mcg total) by mouth daily. 03/29/23   Dani Gobble, NP  traZODone (DESYREL) 50 MG tablet TAKE 1/2 TO 1 TABLET BY MOUTH AT BEDTIME AS NEEDED FOR SLEEP 04/04/23   Tysinger, Kermit Balo, PA-C  TRESIBA FLEXTOUCH 100 UNIT/ML FlexTouch Pen Inject 28 Units into the skin at bedtime. 03/29/23   Dani Gobble, NP  triamcinolone cream (KENALOG) 0.1 % Apply 1 Application topically 2 (two) times daily. 07/05/23   Ronnald Nian, MD    Current Outpatient Medications  Medication Sig Dispense Refill   Accu-Chek Softclix Lancets lancets Use as  instructed to monitor glucose twice daily 100 each 12   atorvastatin (LIPITOR) 40 MG tablet Take 1 tablet (40 mg total) by mouth daily. 90 tablet 3   Blood Glucose Monitoring Suppl (ACCU-CHEK GUIDE ME) w/Device KIT Use as directed to check blood glucose twice daily before breakfast and bed. DX E11.65 1 kit 0   Cholecalciferol (VITAMIN D) 125 MCG (5000 UT) CAPS Take 1 capsule by mouth daily.     estradiol (ESTRACE VAGINAL) 0.1 MG/GM vaginal cream Place 1 Applicatorful vaginally daily as needed.     glucose blood (ACCU-CHEK GUIDE) test strip USE TO CHECK BLOOD GLUCOSE TWICE DAILY 200 strip 2   Insulin Pen Needle (BD PEN NEEDLE NANO U/F) 32G X 4 MM MISC 1 Units by Does not  apply route 2 (two) times daily. 100 each 3   loratadine (CLARITIN) 10 MG tablet Take 10 mg by mouth daily as needed for allergies, rhinitis or itching.     losartan (COZAAR) 25 MG tablet Take 1 tablet (25 mg total) by mouth daily. 90 tablet 3   Multiple Vitamin (MULTIVITAMIN) tablet Take 1 tablet by mouth daily.     naproxen (NAPROSYN) 375 MG tablet Take 1 tablet (375 mg total) by mouth 2 (two) times daily with a meal. 30 tablet 0   Semaglutide, 1 MG/DOSE, (OZEMPIC, 1 MG/DOSE,) 4 MG/3ML SOPN INJECT 1 MG ONCE A WEEK AS DIRECTED 9 mL 0   Sod Picosulfate-Mag Ox-Cit Acd (CLENPIQ) 10-3.5-12 MG-GM -GM/160ML SOLN TAKE AS DIRECTED 350 mL 0   SYNTHROID 112 MCG tablet Take 1 tablet (112 mcg total) by mouth daily. 90 tablet 1   traZODone (DESYREL) 50 MG tablet TAKE 1/2 TO 1 TABLET BY MOUTH AT BEDTIME AS NEEDED FOR SLEEP 90 tablet 0   TRESIBA FLEXTOUCH 100 UNIT/ML FlexTouch Pen Inject 28 Units into the skin at bedtime. 25 mL 3   triamcinolone cream (KENALOG) 0.1 % Apply 1 Application topically 2 (two) times daily. 30 g 0   Current Facility-Administered Medications  Medication Dose Route Frequency Provider Last Rate Last Admin   0.9 %  sodium chloride infusion  500 mL Intravenous Once Lynann Bologna, MD        Allergies as of 07/11/2023 - Review Complete 07/11/2023  Allergen Reaction Noted   Latex  11/01/2019   Sulfa antibiotics Rash 09/04/2013    Family History  Problem Relation Age of Onset   Colon polyps Mother 52   Thyroid disease Mother    Cancer Mother    Pancreatic cancer Mother 50   Hypertension Father    Kidney disease Father    Thyroid cancer Sister        died at 82   Colon cancer Neg Hx    Esophageal cancer Neg Hx    Stomach cancer Neg Hx    Rectal cancer Neg Hx     Social History   Socioeconomic History   Marital status: Married    Spouse name: Not on file   Number of children: Not on file   Years of education: Not on file   Highest education level: Not on file   Occupational History   Not on file  Tobacco Use   Smoking status: Never   Smokeless tobacco: Never  Vaping Use   Vaping status: Never Used  Substance and Sexual Activity   Alcohol use: Never   Drug use: Never   Sexual activity: Yes    Partners: Male    Comment: 1st intercourse- 67, partners- 1, married- 48  yrs,  Other Topics Concern   Not on file  Social History Narrative   Not on file   Social Determinants of Health   Financial Resource Strain: Low Risk  (11/23/2022)   Overall Financial Resource Strain (CARDIA)    Difficulty of Paying Living Expenses: Not hard at all  Food Insecurity: No Food Insecurity (11/23/2022)   Hunger Vital Sign    Worried About Running Out of Food in the Last Year: Never true    Ran Out of Food in the Last Year: Never true  Transportation Needs: No Transportation Needs (11/23/2022)   PRAPARE - Administrator, Civil Service (Medical): No    Lack of Transportation (Non-Medical): No  Physical Activity: Inactive (11/23/2022)   Exercise Vital Sign    Days of Exercise per Week: 0 days    Minutes of Exercise per Session: 0 min  Stress: No Stress Concern Present (11/23/2022)   [employer] of Occupational Health - Occupational Stress Questionnaire    Feeling of Stress : Not at all  Social Connections: Not on file  Intimate Partner Violence: Not on file    Review of Systems: Positive for none All other review of systems negative except as mentioned in the HPI.  Physical Exam: Vital signs in last 24 hours: @VSRANGES @   General:   Alert,  Well-developed, well-nourished, pleasant and cooperative in NAD Lungs:  Clear throughout to auscultation.   Heart:  Regular rate and rhythm; no murmurs, clicks, rubs,  or gallops. Abdomen:  Soft, nontender and nondistended. Normal bowel sounds.   Neuro/Psych:  Alert and cooperative. Normal mood and affect. A and O x 3    No significant changes were identified.  The patient continues to be an  appropriate candidate for the planned procedure and anesthesia.   Edman Circle, MD. [facility] 07/11/2023 11:09 AM@

## 2023-07-11 NOTE — Op Note (Signed)
[facility] Patient Name: Courtney Barry Procedure Date: 07/11/2023 11:06 AM MRN: [id number] Endoscopist: Lynann Bologna , MD, [id number] Age: 72 Referring MD:  Date of Birth: 04/20/1951 Gender: Female Account #: [id number] Procedure:                Colonoscopy Indications:              Screening for colorectal malignant neoplasm Medicines:                Monitored Anesthesia Care Procedure:                Pre-Anesthesia Assessment:                           - Prior to the procedure, a History and Physical                            was performed, and patient medications and                            allergies were reviewed. The patient's tolerance of                            previous anesthesia was also reviewed. The risks                            and benefits of the procedure and the sedation                            options and risks were discussed with the patient.                            All questions were answered, and informed consent                            was obtained. Prior Anticoagulants: The patient has                            taken no anticoagulant or antiplatelet agents. ASA                            Grade Assessment: II - A patient with mild systemic                            disease. After reviewing the risks and benefits,                            the patient was deemed in satisfactory condition to                            undergo the procedure.                           After obtaining informed consent, the colonoscope  was passed under direct vision. Throughout the                            procedure, the patient's blood pressure, pulse, and                            oxygen saturations were monitored continuously. The                            [id number] Colonoscope [id number] was introduced                            through the anus and advanced to the the cecum,                            identified by  appendiceal orifice and ileocecal                            valve. The colonoscopy was performed without                            difficulty. The patient tolerated the procedure                            well. The quality of the bowel preparation was                            good. The ileocecal valve, appendiceal orifice, and                            rectum were photographed. Scope In: 11:17:58 AM Scope Out: 11:36:03 AM Scope Withdrawal Time: 0 hours 12 minutes 33 seconds  Total Procedure Duration: 0 hours 18 minutes 5 seconds  Findings:                 Two sessile polyps were found in the mid descending                            colon and distal ascending colon. The polyps were 3                            to 4 mm in size. These polyps were removed with a                            cold snare. Resection and retrieval were complete.                           Multiple medium-mouthed diverticula were found in                            the sigmoid colon (with luminal narrowing                            consistent with muscular hypertrophy) and  few in                            descending colon.                           Non-bleeding internal hemorrhoids were found during                            retroflexion. The hemorrhoids were small and Grade                            I (internal hemorrhoids that do not prolapse).                           The exam was otherwise without abnormality on                            direct and retroflexion views. Complications:            No immediate complications. Estimated Blood Loss:     Estimated blood loss: none. Impression:               - Two 3 to 4 mm polyps in the mid descending colon                            and in the distal ascending colon, removed with a                            cold snare. Resected and retrieved.                           - Moderate predominantly sigmoid diverticulosis.                           - Non-bleeding  internal hemorrhoids.                           - The examination was otherwise normal on direct                            and retroflexion views. Recommendation:           - Patient has a contact number available for                            emergencies. The signs and symptoms of potential                            delayed complications were discussed with the                            patient. Return to normal activities tomorrow.                            Written discharge instructions were provided to the  patient.                           - High fiber diet.                           - Continue present medications.                           - Await pathology results.                           - Repeat colonoscopy for surveillance based on                            pathology results.                           - The findings and recommendations were discussed                            with the patient's family. Lynann Bologna, MD 07/11/2023 11:41:18 AM This report has been signed electronically.

## 2023-07-11 NOTE — Progress Notes (Signed)
Called to room to assist during endoscopic procedure.  Patient ID and intended procedure confirmed with present staff. Received instructions for my participation in the procedure from the performing physician.  

## 2023-07-12 ENCOUNTER — Telehealth: Payer: Self-pay | Admitting: *Deleted

## 2023-07-12 NOTE — Telephone Encounter (Signed)
  Follow up Call-     07/11/2023   11:05 AM  Call back number  Post procedure Call Back phone  # [phone]  Permission to leave phone message Yes     Patient questions:  Do you have a fever, pain , or abdominal swelling? No. Pain Score  0 *  Have you tolerated food without any problems? Yes.    Have you been able to return to your normal activities? Yes.    Do you have any questions about your discharge instructions: Diet   No. Medications  No. Follow up visit  No.  Do you have questions or concerns about your Care? No.  Actions: * If pain score is 4 or above: No action needed, pain <4.

## 2023-07-13 ENCOUNTER — Encounter: Payer: Self-pay | Admitting: Gastroenterology

## 2023-08-01 ENCOUNTER — Encounter: Payer: Self-pay | Admitting: Nutrition

## 2023-08-01 ENCOUNTER — Encounter: Payer: Self-pay | Admitting: Nurse Practitioner

## 2023-08-01 ENCOUNTER — Ambulatory Visit: Payer: Medicare PPO | Admitting: Nurse Practitioner

## 2023-08-01 ENCOUNTER — Encounter: Payer: Medicare PPO | Attending: Medical | Admitting: Nutrition

## 2023-08-01 VITALS — BP 104/59 | HR 81 | Ht 63.5 in | Wt 167.8 lb

## 2023-08-01 DIAGNOSIS — Z794 Long term (current) use of insulin: Secondary | ICD-10-CM

## 2023-08-01 DIAGNOSIS — Z7985 Long-term (current) use of injectable non-insulin antidiabetic drugs: Secondary | ICD-10-CM

## 2023-08-01 DIAGNOSIS — E782 Mixed hyperlipidemia: Secondary | ICD-10-CM | POA: Diagnosis not present

## 2023-08-01 DIAGNOSIS — E559 Vitamin D deficiency, unspecified: Secondary | ICD-10-CM | POA: Diagnosis not present

## 2023-08-01 DIAGNOSIS — I10 Essential (primary) hypertension: Secondary | ICD-10-CM

## 2023-08-01 DIAGNOSIS — E1165 Type 2 diabetes mellitus with hyperglycemia: Secondary | ICD-10-CM | POA: Diagnosis not present

## 2023-08-01 DIAGNOSIS — Z713 Dietary counseling and surveillance: Secondary | ICD-10-CM | POA: Diagnosis not present

## 2023-08-01 DIAGNOSIS — E039 Hypothyroidism, unspecified: Secondary | ICD-10-CM

## 2023-08-01 LAB — POCT GLYCOSYLATED HEMOGLOBIN (HGB A1C): Hemoglobin A1C: 7.5 % — AB (ref 4.0–5.6)

## 2023-08-01 MED ORDER — BD PEN NEEDLE NANO U/F 32G X 4 MM MISC: 1.0000 [IU] | Freq: Every day | 3 refills | Status: DC

## 2023-08-01 MED ORDER — SEMAGLUTIDE (2 MG/DOSE) 8 MG/3ML ~~LOC~~ SOPN
2.0000 mg | PEN_INJECTOR | SUBCUTANEOUS | 1 refills | Status: DC
Start: 2023-08-01 — End: 2024-01-16

## 2023-08-01 NOTE — Patient Instructions (Signed)
Goals  INcrease more plant based foods Get back with silver sneakers.-try water aerobics. Get A1C down to 7%

## 2023-08-01 NOTE — Progress Notes (Signed)
08/01/2023, 8:52 AM                            Endocrinology follow-up note  Subjective:    Patient ID: Courtney Barry, female    DOB: [birth year].  Courtney Barry is being seen in follow-up for management of currently uncontrolled symptomatic diabetes requested by  Jac Canavan, PA-C.   Past Medical History:  Diagnosis Date   Arthritis    generalized- RIGHT hip   Diabetes mellitus, type II (HCC)    Encounter for specialized medical examination 09/04/2013   Hyperlipidemia    on meds   Hypertension    on meds   Hypothyroidism    on meds   Menopausal vaginal dryness    Normal gynecologic examination 09/05/2014   Seasonal allergies    Vaccine counseling 05/21/2021    Past Surgical History:  Procedure Laterality Date   ABDOMINAL HYSTERECTOMY     APPENDECTOMY     CHOLECYSTECTOMY     COLONOSCOPY  2014   Dr.Richardson-hems/tics/abnormal rectal exam-good prep   TONSILLECTOMY      Social History   Socioeconomic History   Marital status: Married    Spouse name: Not on file   Number of children: Not on file   Years of education: Not on file   Highest education level: Not on file  Occupational History   Not on file  Tobacco Use   Smoking status: Never   Smokeless tobacco: Never  Vaping Use   Vaping status: Never Used  Substance and Sexual Activity   Alcohol use: Never   Drug use: Never   Sexual activity: Yes    Partners: Male    Comment: 1st intercourse- 89, partners- 1, married- 44 yrs,  Other Topics Concern   Not on file  Social History Narrative   Not on file   Social Determinants of Health   Financial Resource Strain: Low Risk  (11/23/2022)   Overall Financial Resource Strain (CARDIA)    Difficulty of Paying Living Expenses: Not hard at all  Food Insecurity: No Food Insecurity (11/23/2022)   Hunger Vital Sign    Worried About Running Out of Food in the Last Year:  Never true    Ran Out of Food in the Last Year: Never true  Transportation Needs: No Transportation Needs (11/23/2022)   PRAPARE - Administrator, Civil Service (Medical): No    Lack of Transportation (Non-Medical): No  Physical Activity: Inactive (11/23/2022)   Exercise Vital Sign    Days of Exercise per Week: 0 days    Minutes of Exercise per Session: 0 min  Stress: No Stress Concern Present (11/23/2022)   [employer] of Occupational Health - Occupational Stress Questionnaire    Feeling of Stress : Not at all  Social Connections: Not on file    Family History  Problem Relation Age of Onset   Colon polyps Mother 64   Thyroid disease Mother    Cancer Mother    Pancreatic cancer Mother 49   Hypertension Father    Kidney disease Father    Thyroid cancer  Sister        died at 6   Colon cancer Neg Hx    Esophageal cancer Neg Hx    Stomach cancer Neg Hx    Rectal cancer Neg Hx     Outpatient Encounter Medications as of 08/01/2023  Medication Sig   Accu-Chek Softclix Lancets lancets Use as instructed to monitor glucose twice daily   atorvastatin (LIPITOR) 40 MG tablet Take 1 tablet (40 mg total) by mouth daily.   Blood Glucose Monitoring Suppl (ACCU-CHEK GUIDE ME) w/Device KIT Use as directed to check blood glucose twice daily before breakfast and bed. DX E11.65   Cholecalciferol (VITAMIN D) 125 MCG (5000 UT) CAPS Take 1 capsule by mouth daily.   estradiol (ESTRACE VAGINAL) 0.1 MG/GM vaginal cream Place 1 Applicatorful vaginally daily as needed.   glucose blood (ACCU-CHEK GUIDE) test strip USE TO CHECK BLOOD GLUCOSE TWICE DAILY   loratadine (CLARITIN) 10 MG tablet Take 10 mg by mouth daily as needed for allergies, rhinitis or itching.   losartan (COZAAR) 25 MG tablet Take 1 tablet (25 mg total) by mouth daily.   Multiple Vitamin (MULTIVITAMIN) tablet Take 1 tablet by mouth daily.   naproxen (NAPROSYN) 375 MG tablet Take 1 tablet (375 mg total) by mouth 2 (two) times  daily with a meal.   Semaglutide, 2 MG/DOSE, 8 MG/3ML SOPN Inject 2 mg as directed once a week.   SYNTHROID 112 MCG tablet Take 1 tablet (112 mcg total) by mouth daily.   traZODone (DESYREL) 50 MG tablet TAKE 1/2 TO 1 TABLET BY MOUTH AT BEDTIME AS NEEDED FOR SLEEP   TRESIBA FLEXTOUCH 100 UNIT/ML FlexTouch Pen Inject 28 Units into the skin at bedtime.   triamcinolone cream (KENALOG) 0.1 % Apply 1 Application topically 2 (two) times daily.   [DISCONTINUED] Insulin Pen Needle (BD PEN NEEDLE NANO U/F) 32G X 4 MM MISC 1 Units by Does not apply route 2 (two) times daily.   [DISCONTINUED] Semaglutide, 1 MG/DOSE, (OZEMPIC, 1 MG/DOSE,) 4 MG/3ML SOPN INJECT 1 MG ONCE A WEEK AS DIRECTED   Insulin Pen Needle (BD PEN NEEDLE NANO U/F) 32G X 4 MM MISC 1 Units by Does not apply route daily at 2 PM.   No facility-administered encounter medications on file as of 08/01/2023.    ALLERGIES: Allergies  Allergen Reactions   Latex     Gloves, band aids- causes rash   Sulfa Antibiotics Rash    VACCINATION STATUS: Immunization History  Administered Date(s) Administered   Fluad Quad(high Dose 65+) 09/10/2020, 08/14/2021, 08/03/2022   Influenza-Unspecified 08/09/2019   PFIZER Comirnaty(Gray Top)Covid-19 Tri-Sucrose Vaccine 05/28/2021   PFIZER(Purple Top)SARS-COV-2 Vaccination 12/31/2019, 01/02/2020, 08/25/2020   Pneumococcal Conjugate-13 11/24/2020   Pneumococcal Polysaccharide-23 01/19/2022    Diabetes She presents for her follow-up diabetic visit. She has type 2 diabetes mellitus. Onset time: She was diagnosed at approximate age of 47 years. Her disease course has been improving. There are no hypoglycemic associated symptoms. Pertinent negatives for hypoglycemia include no confusion, headaches, nervousness/anxiousness, pallor, seizures or tremors. Pertinent negatives for diabetes include no chest pain, no fatigue, no polydipsia, no polyphagia, no polyuria and no weight loss. There are no hypoglycemic  complications. Symptoms are stable. Diabetic complications include nephropathy and retinopathy. Risk factors for coronary artery disease include diabetes mellitus, dyslipidemia, hypertension, post-menopausal, sedentary lifestyle and obesity. Current diabetic treatment includes insulin injections (and Ozempic). She is compliant with treatment most of the time. Her weight is fluctuating minimally. She is following a generally healthy diet. When  asked about meal planning, she reported none. She has not had a previous visit with a dietitian. She participates in exercise intermittently. Her home blood glucose trend is fluctuating minimally. Her breakfast blood glucose range is generally 130-140 mg/dl. Her bedtime blood glucose range is generally 140-180 mg/dl. (She presents today with her meter and logs showing mostly at goal glycemic profile overall.  Her numbers are a bit higher than when she was here last in May.  Her POCT A1c today is 7.5%, unchanged from last visit.  Analysis of her meter shows 7-day average of 165, 14-day average of 157, 30-day average of 152, 90-day average of 151.  She denies any hypoglycemia.  She notes she was in [place] and walked more than she usually does which triggered left hip pain.  This has impacted her ability to work out routinely.  She does have plans to get back on track though.) An ACE inhibitor/angiotensin II receptor blocker is being taken. She does not see a podiatrist.Eye exam is current.  Hyperlipidemia This is a chronic problem. The current episode started more than 1 year ago. The problem is controlled. Recent lipid tests were reviewed and are normal. Exacerbating diseases include diabetes. There are no known factors aggravating her hyperlipidemia. Pertinent negatives include no chest pain, myalgias or shortness of breath. Current antihyperlipidemic treatment includes statins. The current treatment provides moderate improvement of lipids. There are no compliance problems.  Risk  factors for coronary artery disease include diabetes mellitus, dyslipidemia, hypertension, a sedentary lifestyle and post-menopausal.  Thyroid Problem Presents for follow-up visit. Onset time: She was diagnosed with hypothyroidism in her 72s, currently taking Synthroid 112 mcg p.o. daily. Patient reports no anxiety, cold intolerance, constipation, depressed mood, diarrhea, fatigue, heat intolerance, leg swelling, palpitations, tremors, weight gain or weight loss. The symptoms have been stable. Her past medical history is significant for diabetes and hyperlipidemia.  Hypertension This is a chronic problem. The current episode started more than 1 year ago. The problem has been resolved since onset. The problem is controlled. Pertinent negatives include no chest pain, headaches, palpitations or shortness of breath. Agents associated with hypertension include thyroid hormones. Risk factors for coronary artery disease include diabetes mellitus, dyslipidemia, family history, post-menopausal state and sedentary lifestyle. Past treatments include angiotensin blockers. The current treatment provides moderate improvement. There are no compliance problems.  Hypertensive end-organ damage includes retinopathy. Identifiable causes of hypertension include a thyroid problem.    Review of systems  Constitutional: + stable body weight,  current Body mass index is 29.26 kg/m. , + fatigue, no subjective hypothermia Eyes: no blurry vision, no xerophthalmia ENT: no sore throat, no nodules palpated in throat, no dysphagia/odynophagia, no hoarseness Cardiovascular: no chest pain, no shortness of breath, no palpitations, no leg swelling Respiratory: no cough, no shortness of breath Gastrointestinal: no nausea/vomiting/diarrhea Musculoskeletal: left hip pain Skin: no rashes, no hyperemia Neurological: no tremors, no numbness, no tingling, no dizziness Psychiatric: no depression, no anxiety   Objective:    BP (!)  104/59 (BP Location: Left Arm, Patient Position: Sitting, Cuff Size: Large)   Pulse 81   Ht 5' 3.5" (1.613 m)   Wt 167 lb 12.8 oz (76.1 kg)   BMI 29.26 kg/m   Wt Readings from Last 3 Encounters:  08/01/23 167 lb 12.8 oz (76.1 kg)  07/11/23 168 lb (76.2 kg)  07/05/23 170 lb (77.1 kg)    BP Readings from Last 3 Encounters:  08/01/23 (!) 104/59  07/11/23 137/65  07/05/23 (!) 120/58  Physical Exam- Limited  Constitutional:  Body mass index is 29.26 kg/m. , not in acute distress, normal state of mind Eyes:  EOMI, no exophthalmos Musculoskeletal: no gross deformities, strength intact in all four extremities, no gross restriction of joint movements Skin:  no rashes, no hyperemia Neurological: no tremor with outstretched hands     Recent Results (from the past 2160 hour(s))  HgB A1c     Status: Abnormal   Collection Time: 08/01/23  8:21 AM  Result Value Ref Range   Hemoglobin A1C 7.5 (A) 4.0 - 5.6 %   HbA1c POC (<> result, manual entry)     HbA1c, POC (prediabetic range)     HbA1c, POC (controlled diabetic range)          Assessment & Plan:   1) Uncontrolled type 2 diabetes mellitus with hyperglycemia (HCC)  - Sarah Shirlyn Sutliff has currently controlled asymptomatic type 2 DM since  72 years of age.  She presents today with her meter and logs showing mostly at goal glycemic profile overall.  Her numbers are a bit higher than when she was here last in May.  Her POCT A1c today is 7.5%, unchanged from last visit.  Analysis of her meter shows 7-day average of 165, 14-day average of 157, 30-day average of 152, 90-day average of 151.  She denies any hypoglycemia.  She notes she was in [place] and walked more than she usually does which triggered left hip pain.  This has impacted her ability to work out routinely.  She does have plans to get back on track though.  -her diabetes is complicated by retinopathy obesity/sedentary life and she remains at a high risk for more acute and  chronic complications which include CAD, CVA, CKD, retinopathy, and neuropathy. These are all discussed in detail with her.  - Nutritional counseling repeated at each appointment due to patients tendency to fall back in to old habits.  - The patient admits there is a room for improvement in their diet and drink choices. -  Suggestion is made for the patient to avoid simple carbohydrates from their diet including Cakes, Sweet Desserts / Pastries, Ice Cream, Soda (diet and regular), Sweet Tea, Candies, Chips, Cookies, Sweet Pastries, Store Bought Juices, Alcohol in Excess of 1-2 drinks a day, Artificial Sweeteners, Coffee Creamer, and "Sugar-free" Products. This will help patient to have stable blood glucose profile and potentially avoid unintended weight gain.   - I encouraged the patient to switch to unprocessed or minimally processed complex starch and increased protein intake (animal or plant source), fruits, and vegetables.   - Patient is advised to stick to a routine mealtimes to eat 3 meals a day and avoid unnecessary snacks (to snack only to correct hypoglycemia).  - I have approached her with the following individualized plan to manage  her diabetes and patient agrees:   -She will continue to benefit from simplified treatment regimen, given her tight control of diabetes.    -She is advised to increase her Ozempic to 2 mg SQ weekly.  Once she starts that dose, she can lower her Tresiba to 25 units SQ nightly.  We discussed using other oral medications like Glipizide or Actos but decided against it for now.   -She is encouraged to routinely fingerstick twice daily, before breakfast and before bed, and to call the clinic if she has readings less than 70 or above 300 for 3 tests in a row.  She did not tolerate CGM (adhesive allergy).  -  She has allergy to sulfa medications, therefore not a candidate for glipizide.  She did have significant diarrhea with Metformin, therefore will avoid that as  well.  - Specific targets for  A1c;  LDL, HDL, Triglycerides,  were discussed with the patient.  2) Blood Pressure /Hypertension:  Her blood pressure is controlled to target.  She is advised to continue Losartan 25 mg po daily.  3) Lipids/Hyperlipidemia:  Her most recent lipid panel from 04/13/22 shows controlled LDL at 50.  She is advised to continue Lipitor 40 mg po daily at bedtime.  Side effects and precautions discussed with her.    4) Hypothyroidism- longstanding diagnosis since at least from her 70s.  There are no recent TFTs to reivew.  She is advised to continue Synthroid 112 mcg po daily before breakfast.  She does have family history of thyroid cancer (she is unsure of which type).  This may help determine her risk factor and if she needs to stop GLP1 therapy in the future.   - We discussed about the correct intake of her thyroid hormone, on empty stomach at fasting, with water, separated by at least 30 minutes from breakfast and other medications,  and separated by more than 4 hours from calcium, iron, multivitamins, acid reflux medications (PPIs). -Patient is made aware of the fact that thyroid hormone replacement is needed for life, dose to be adjusted by periodic monitoring of thyroid function tests.  5)  Weight/Diet:  Her Body mass index is 29.26 kg/m.-   she is a candidate for some weight loss. I discussed with her the fact that loss of 5 - 10% of her  current body weight will have the most impact on her diabetes management.  Exercise, and detailed carbohydrates information provided  -  detailed on discharge instructions.  6) Chronic Care/Health Maintenance: -she is on ACEI/ARB and Statin medications and is encouraged to initiate and continue to follow up with Ophthalmology, Dentist, Podiatrist at least yearly or according to recommendations, and advised to stay away from smoking. I have recommended yearly flu vaccine and pneumonia vaccine at least every 5 years; moderate  intensity exercise for up to 150 minutes weekly; and sleep for at least 7 hours a day.  - she is advised to maintain close follow up with Tysinger, Kermit Balo, PA-C for primary care needs, as well as her other providers for optimal and coordinated care.     I spent  40  minutes in the care of the patient today including review of labs from CMP, Lipids, Thyroid Function, Hematology (current and previous including abstractions from other facilities); face-to-face time discussing  her blood glucose readings/logs, discussing hypoglycemia and hyperglycemia episodes and symptoms, medications doses, her options of short and long term treatment based on the latest standards of care / guidelines;  discussion about incorporating lifestyle medicine;  and documenting the encounter. Risk reduction counseling performed per USPSTF guidelines to reduce obesity and cardiovascular risk factors.     Please refer to Patient Instructions for Blood Glucose Monitoring and Insulin/Medications Dosing Guide"  in media tab for additional information. Please  also refer to " Patient Self Inventory" in the Media  tab for reviewed elements of pertinent patient history.  Berkley Solene Vasiliou participated in the discussions, expressed understanding, and voiced agreement with the above plans.  All questions were answered to her satisfaction. she is encouraged to contact clinic should she have any questions or concerns prior to her return visit.    Follow up plan: -  Return in about 4 months (around 12/01/2023) for Diabetes F/U with A1c in office, Thyroid follow up, Previsit labs, Bring meter and logs.   Ronny Bacon, [facility] [address] [place], [place] [address] Phone: [phone] Fax: [phone]  08/01/2023, 8:52 AM

## 2023-08-01 NOTE — Progress Notes (Signed)
Medical Nutrition Therapy:  Appt start time: 0830 end time:   0900  Assessment:  Primary concerns today: Diabetes Type 2 x 15 years. Saw Courtney Barry Today   A1C 7.5%. Same as it was previously. FBS 133 mg/dl. Seh notes sometimes her BS go above 200's.  Has been under a lot of stress with family members and hasn't been eating her scheduled foods. Still on Ozempic 1.0 and will be increasing to 2.0 this week. Tends to be not hungry much at lunch time and then is overly hungry in am. Taking Tresiba 26 units starting today(was on 28 units)  since Ozempic is increasing. Had started sliver sneakers but has gotten off schedule. Has had some hip issues. Wants to see massage therapist. Lost 1-2 lbs.  Unable to use [name] anymore due to reaction to adhesive.   Goals previously set: Focus on more plant based foods-still working on it. Exercise on treadmill 3-4 times per week for 30 minutes-doing twice a week Cut out fast foods and junk food-done Cut lose 3-5 lbs per month- Drink 4 bottles of water per day; -done.  Making slow progress.  Sees Courtney Bacon, FNP at [facility] for her DM.  Lab Results  Component Value Date   HGBA1C 7.5 (A) 08/01/2023   Wt Readings from Last 3 Encounters:  08/01/23 167 lb 12.8 oz (76.1 kg)  07/11/23 168 lb (76.2 kg)  07/05/23 170 lb (77.1 kg)   Ht Readings from Last 3 Encounters:  08/01/23 5' 3.5" (1.613 m)  07/11/23 5\' 4"  (1.626 m)  04/18/23 5\' 4"  (1.626 m)   There is no height or weight on file to calculate BMI. @BMIFA @ Facility age limit for growth %iles is 20 years. Facility age limit for growth %iles is 20 years.    Lab Results  Component Value Date   HGBA1C 7.5 (A) 08/01/2023      Latest Ref Rng & Units 03/22/2023    8:48 AM 11/22/2022    8:38 AM 04/13/2022    8:36 AM  CMP  Glucose 70 - 99 mg/dL 161  096  045   BUN 8 - 27 mg/dL 18  15  14    Creatinine 0.57 - 1.00 mg/dL 4.09  8.11  9.14   Sodium 134 - 144 mmol/L 140  142  139    Potassium 3.5 - 5.2 mmol/L 4.3  4.2  4.2   Chloride 96 - 106 mmol/L 102  105  103   CO2 20 - 29 mmol/L 21  21  23    Calcium 8.7 - 10.3 mg/dL 9.8  9.4  9.2   Total Protein 6.0 - 8.5 g/dL 7.1  6.8  7.1   Total Bilirubin 0.0 - 1.2 mg/dL 0.7  0.9  0.7   Alkaline Phos 44 - 121 IU/L 86  81  82   AST 0 - 40 IU/L 21  17  15    ALT 0 - 32 IU/L 32  30  26      Preferred Learning Style: No preference indicated   Learning Readiness:  Ready Change in progress  MEDICATIONS:    DIETARY INTAKE:   24-hr recall:  B ( AM): English muffin, egg whites,  Lunch: [employer] kid meal- tenders and 1/2 fries. water [employer], green beans, 1/2 sweet potato and 1/ 2 a roll, water Snack:    Beverages: water  Usual physical activity: walking.  Estimated energy needs: 1200 calories 135 g carbohydrates 90 g protein 33  g fat  Progress Towards  Goal(s):  In progress.   Nutritional Diagnosis:  NB-1.1 Food and nutrition-related knowledge deficit As related to DIabetes Type 2.  As evidenced by A1C 7.4%%    Intervention:  Meal planning, portion control, high fiber diet.  Goals Goals  INcrease more plant based foods Get back with silver sneakers.-try water aerobics. Get A1C down to 7%   Teaching Method Utilized:  Visual Auditory Hands on  Handouts given during visit include: Discussed using [employer] and [email or web address] websites for recipe ideas.  Barriers to learning/adherence to lifestyle change: none  Demonstrated degree of understanding via:  Teach Back   Monitoring/Evaluation:  Dietary intake, exercise, , and body weight in 3 month(s).

## 2023-08-11 ENCOUNTER — Other Ambulatory Visit: Payer: Self-pay | Admitting: Medical

## 2023-08-28 ENCOUNTER — Other Ambulatory Visit: Payer: Self-pay | Admitting: Medical

## 2023-08-28 DIAGNOSIS — E1169 Type 2 diabetes mellitus with other specified complication: Secondary | ICD-10-CM

## 2023-08-29 ENCOUNTER — Ambulatory Visit: Payer: Medicare PPO | Admitting: Medical

## 2023-08-29 VITALS — BP 122/80 | HR 95 | Temp 97.8°F | Wt 170.4 lb

## 2023-08-29 DIAGNOSIS — G8929 Other chronic pain: Secondary | ICD-10-CM | POA: Diagnosis not present

## 2023-08-29 DIAGNOSIS — Z23 Encounter for immunization: Secondary | ICD-10-CM

## 2023-08-29 DIAGNOSIS — M25562 Pain in left knee: Secondary | ICD-10-CM | POA: Diagnosis not present

## 2023-08-29 DIAGNOSIS — M2560 Stiffness of unspecified joint, not elsewhere classified: Secondary | ICD-10-CM | POA: Diagnosis not present

## 2023-08-29 DIAGNOSIS — M25512 Pain in left shoulder: Secondary | ICD-10-CM | POA: Diagnosis not present

## 2023-08-29 NOTE — Progress Notes (Signed)
Subjective:  Courtney Barry is a 72 y.o. female who presents for Chief Complaint  Patient presents with   Knee Pain    Left Knee pain x 2 months but getting worse     Here for some left knee pain x 2 months, but left shoulder and left neck been hurting in last month.  Had some massage therapy within past month which helped the left shoulder and left neck.  Sometimes the left knee seems to give way.  Has basely had daily chronic pain for months now.  This affects her ability to go play golf.  She is avoiding things she enjoys due to the daily pain.  No swelling.  She does get morning stiffness in general that last up to an hour.  No specific hand pain or swelling.  She uses ice, heat, Advil.  She also use compression socks daily.  She would like a flu shot  No other aggravating or relieving factors.    No other c/o.  The following portions of the patient's history were reviewed and updated as appropriate: allergies, current medications, past family history, past medical history, past social history, past surgical history and problem list.  ROS Otherwise as in subjective above  Objective: BP 122/80   Pulse 95   Temp 97.8 F (36.6 C)   Wt 170 lb 6.4 oz (77.3 kg)   BMI 29.71 kg/m   General appearance: alert, no distress, well developed, well nourished Neck with slightly decreased range of motion to the left otherwise neck normal range of motion nontender no lymphadenopathy or mass Left shoulder with slightly reduced internal and external range of motion otherwise normal range of motion without swelling or deformity.   arms nontender throughout. Both knees with some bony arthritic changes noted on exam but otherwise no swelling no deformity no laxity, nontender legs throughout normal range of motion Arms and legs neurovascularly intact   Assessment: Encounter Diagnoses  Name Primary?   Chronic pain of left knee Yes   Chronic left shoulder pain    Needs flu shot    Morning  stiffness of joints      Plan: I reviewed her prior x-rays of left shoulder and left knee, left shoulder in 2021, left knee in 2022, both showing some arthritic changes.  No other obvious findings on exam today.  We discussed the following recommendations   Patient Instructions  I placed referral today for ortho, Dr. August Saucer. Expect a phone call soon about this  In the meantime consider the following treatment regimens or options:  I would recommend trying over-the-counter fish oil 1000 mg twice daily and/or glucosamine chondroitin supplement over-the-counter  For pain in general you can use topical remedies such as capsaicin cream Aspercreme or Voltaren gel  Oral options for pain include Tylenol once or twice a day and continue turmeric in your cooking  Reserve Aleve over-the-counter or ibuprofen over-the-counter for worse flareup of pain short-term for 3 to 5 days at a time  Counseled on the influenza virus vaccine.  Vaccine information sheet given.  Influenza vaccine given after consent obtained.    Courtney Barry was seen today for knee pain.  Diagnoses and all orders for this visit:  Chronic pain of left knee -     Cancel: AMB referral to orthopedics -     AMB referral to orthopedics  Chronic left shoulder pain -     Cancel: AMB referral to orthopedics -     AMB referral to orthopedics  Needs  flu shot -     Flu Vaccine Trivalent High Dose (Fluad)  Morning stiffness of joints -     AMB referral to orthopedics    Follow up: referral to ortho

## 2023-08-29 NOTE — Patient Instructions (Addendum)
I placed referral today for ortho, Dr. August Saucer. Expect a phone call soon about this  In the meantime consider the following treatment regimens or options:  I would recommend trying over-the-counter fish oil 1000 mg twice daily and/or glucosamine chondroitin supplement over-the-counter  For pain in general you can use topical remedies such as capsaicin cream Aspercreme or Voltaren gel  Oral options for pain include Tylenol once or twice a day and continue turmeric in your cooking  Reserve Aleve over-the-counter or ibuprofen over-the-counter for worse flareup of pain short-term for 3 to 5 days at a time

## 2023-09-08 ENCOUNTER — Encounter (INDEPENDENT_AMBULATORY_CARE_PROVIDER_SITE_OTHER): Payer: Self-pay

## 2023-09-08 ENCOUNTER — Ambulatory Visit: Payer: Medicare PPO

## 2023-09-08 ENCOUNTER — Other Ambulatory Visit: Payer: Self-pay

## 2023-09-08 ENCOUNTER — Telehealth (INDEPENDENT_AMBULATORY_CARE_PROVIDER_SITE_OTHER): Payer: Self-pay

## 2023-09-08 VITALS — BP 124/80 | HR 57 | Temp 97.2°F | Ht 63.0 in | Wt 140.2 lb

## 2023-09-08 DIAGNOSIS — M8580 Other specified disorders of bone density and structure, unspecified site: Secondary | ICD-10-CM | POA: Insufficient documentation

## 2023-09-08 DIAGNOSIS — M810 Age-related osteoporosis without current pathological fracture: Secondary | ICD-10-CM | POA: Insufficient documentation

## 2023-09-08 DIAGNOSIS — L659 Nonscarring hair loss, unspecified: Secondary | ICD-10-CM | POA: Insufficient documentation

## 2023-09-08 DIAGNOSIS — E039 Hypothyroidism, unspecified: Secondary | ICD-10-CM | POA: Insufficient documentation

## 2023-09-08 DIAGNOSIS — E559 Vitamin D deficiency, unspecified: Secondary | ICD-10-CM | POA: Insufficient documentation

## 2023-09-08 MED ORDER — LEVOTHYROXINE 75 MCG TABLET
ORAL_TABLET | ORAL | Status: DC
Start: 2023-09-08 — End: 2023-12-12

## 2023-09-08 NOTE — Assessment & Plan Note (Signed)
Reporting hairline and generalized hair thinning  TSH low-normal     - discussed differentials including androgenetic alopecia, telogen effluvium, iron deficiency   -we will order blood work to test for iron deficiency anemia, and will check testosterone level.     the following treatment options can be considered:  1. Some experts treat female pattern hair loss and telogen effluvium with topical 5% minoxidil (women's Rogaine) solution twice daily, with the plan to continue this treatment indefinitely if there is evidence of benefit within 1 year of use.   2. Another option is Antiandrogen (testosterone blocking) medications, such as spironolactone.   3. If the results are unsatisfactory and the patient is distressed by hair loss, hair transplantation may be considered, if this procedure is available and affordable to the patient

## 2023-09-08 NOTE — Assessment & Plan Note (Signed)
Chronic  Endorsing hair loss, anxiety, palpitations, jitteriness  TSH 0.7 low end of normal    - given age and symptoms goal TSH 2.5; mid high normal   - will reduce dose to 1 tablet 6 days a week; equivalent to 9 tablets weekly otherwise  - follow up thyroid labs in 6 weeks

## 2023-09-08 NOTE — Progress Notes (Addendum)
ENDOCRINOLOGY, [facility]  [address]  [place] [address]  Operated by [facility]    Endocrinology New Patient Visit      NAME: Kristin Kennedy DOS: 09/08/2023   MRN: [id number] DOB: September 29, 1951     Reason for Visit: New Patient (NP Thyroid lots of hair loss. Thinks maybe on high side of thyroid)    New to me.  Previously saw Dr. Felicity Pellegrini    PCP - Aliene Beams, MD    Subjective: Kristin Kennedy is a 72 y.o. female with PMH of Hypothyroidism, Osteopenia, vitamin D deficiency who comes in today for endocrinology evaluation and management.      Concerns today include; progressive hair loss especially at hair line.   Thyroid medication has increased and after taking it; now starting to feel anxious with palpitations.    Current med: Levothyroxine 1 tablet daily, taking it appropriately on an empty stomach, waiting an hour to eat or drink anything.  Not missing doses. Has been on this dose for few years.     Heat or cold intolerance: denies any  Energy: good overall  Insomnia and anxiety: anxiety   Significant change in the weight: denies   Palpitation or tremor:  palpitations/tremors with movement   Excessive sweating: denies   Skin changes or hair loss: hair loss   Constipation or diarrhea: denies ; more so constipation   Menstrual cycles; hysterectomy     Previous treatment for osteoporosis or osteopenia: osteopenia   Personal Hx of fracture as an adult: yes; in 2019 broke tibia after stepping down from ladder. About 6 months after had fracture of patella.   Change in height since adulthood: ways been about 5'3   Hx of fracture or osteoporosis in first-degree relative: no  Caucasian race: yes  Dental:  Up to date yes  Upcoming implants; none   Prolonged or chronic steroid use: no; has had rare short courses   Low body weight (<127 lbs): no  Estrogen deficiency     early menopause (age <45) or bilateral ovariectomy: denies      Recurrent falls: no  Inadequate physical  activity: {no  Renal calculi :no  Family history of renal calculi: no  Family history of high calcium or hyperparathyroidism: no  Heparin/blood thinner use: no  Radiation or chemotherapy: no  Long term immobility: no; stays active, working out and swimming.     Current calcium and Vit D intake:  Dietary sources: Milk; 1 glasses a day. Cheese: 0 servings a week, Yogurt: 0 servings a week.   Life extension bone restore; 2 tablets daily   supplements: Ca: 0 mg a day  Vitamin D: 5000 IU a day  Multivitamins: 2 tabs a day  Fortified orange juice:    PPI Use:  no    No known family history of thyroid disease, thyroid cancer, diabetes.     Used to work as Social worker.     Denies history of tobacco use. Denies alcohol intake.       The following portions of the patient's history were reviewed and updated as appropriate: allergies, current medications, past family history, past medical history, past social history, past surgical history and problem list.     Past Medical History:   Diagnosis Date    BCC (basal cell carcinoma), eyelid     Cervical dysplasia     Hypothyroidism     Miscarriage     Thalassemia minor  Past Surgical History:   Procedure Laterality Date    BASAL CELL CARCINOMA EXCISION      EYELID    HX BREAST BIOPSY      HX COLPOSCOPY      HX CRYOSURGERY OF CERVIX      HX HYSTERECTOMY      TVH WO BSO    OTHER SURGICAL HISTORY      FX OF FEMUR     Family Medical History:       Problem Relation (Age of Onset)    Breast Cancer Maternal Aunt, Maternal cousin    Diabetes Paternal Grandfather    High Cholesterol Father    Hypertension (High Blood Pressure) Mother    Melanoma Paternal Grandfather             Social History     Socioeconomic History    Marital status: Married   Tobacco Use    Smoking status: Never    Smokeless tobacco: Never   Vaping Use    Vaping status: Never Used   Substance and Sexual Activity    Alcohol use: Never    Drug use: Never    Sexual activity: Yes     Partners: Male     Birth  control/protection: Surgical       Allergies:  No Known Allergies    Medications:  Current Outpatient Medications   Medication Instructions    cholecalciferol (Vitamin D3) 5,000 Units, Oral    levothyroxine (SYNTHROID) 75 mcg Oral Tablet Take 1 tablet 6 days a week, skip Sundays.    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 All negative save for those detailed in HPI.     Objective:  BP 124/80   Pulse 57   Temp 36.2 C (97.2 F)   Ht 1.6 m (5\' 3" )   Wt 63.6 kg (140 lb 3.2 oz)   SpO2 98%   BMI 24.84 kg/m       Wt Readings from Last 3 Encounters:   09/08/23 63.6 kg (140 lb 3.2 oz)   12/22/22 62.6 kg (138 lb)   12/16/21 62.6 kg (138 lb)        GENERAL APPEARANCE: alert, cooperative, oriented, and in no acute distress. No Kyphosis or scoliosis.   HEENT: Head Normocephalic, Atraumatic. No terminal facial hairs.   EYES: conjunctivae/corneas clear. PERRL   NECK: Neck supple. No cervical adenopathy. Thyroid not enlarged, no palpable nodules.   CHEST AND LUNGS: Clear to auscultation bilaterally  CARDIOVASCULAR: Regular rate and rhythm, no murmurs  ABDOMEN: Soft, non-tender.   EXTREMITIES: No edema in lower extremities b/l. No pain on palpation of the spine.  SKIN: normal color and texture  NEUROLOGIC: Gait normal. No tremor noted with outstretched arms.  PSYCH: Normal mood and affect    Labs:    Reviewed labs from PCP 07/22/2023     No results found for: "HA1C"  No results found for: "TSH", "FREET4"  No results found for: "WBC", "HGB", "HCT", "MCV", "MCH", "PLT"  No results found for: "NA", "K", "CO2", "BUN", "  CREATININE", "CALCIUM"  No components found for: "GFRAA",  "GFRNONAA"  No results found for: "ALT", "AST", "ALKPHOS", "ALBUMIN"  No results found for: "CHOLESTEROL", "HDLCHOL", "LDLCHOL", "TRIG"  No results found for: "MICALBCRERAT"  No results found for: "TSI"  No results found for: "CALCIUM"    Imaging:    Dxa 2021      Cytology/Pathology:      Assessment & Plan:    Hypothyroidism  Chronic  Endorsing hair loss, anxiety, palpitations, jitteriness  TSH 0.7 low end of normal    - given age and symptoms goal TSH 2.5; mid high normal   - will reduce dose to 1 tablet 6 days a week; equivalent to 9 tablets weekly otherwise  - follow up thyroid labs in 6 weeks     Vitamin D deficiency  Replete on vitamin D 5000 international units daily    - goal >30ng/mL  - discussed can reduce to 2000 international units once runs out    Osteopenia  Risk factors osteoporosis including advancing age, gender, vitamin D deficiency  Last DXA 2021; T score -2.2 at left hip    - due for follow up DXA now  - encouraged adequate calcium intake, continue vitamin D   - encouraged weight bearing activity  - discussed if high risk of FRAX or DXA showing osteoporosis would benefit from medical therapy to prevent further bone degradation    Hair loss  Reporting hairline and generalized hair thinning  TSH low-normal     - discussed differentials including androgenetic alopecia, telogen effluvium, iron deficiency   -we will order blood work to test for iron deficiency anemia, and will check testosterone level.     the following treatment options can be considered:  1. Some experts treat female pattern hair loss and telogen effluvium with topical 5% minoxidil (women's Rogaine) solution twice daily, with the plan to continue this treatment indefinitely if there is evidence of benefit within 1 year of use.   2. Another option is Antiandrogen (testosterone blocking) medications, such as spironolactone.   3. If the results are unsatisfactory and the patient is distressed by hair loss, hair  transplantation may be considered, if this procedure is available and affordable to the patient         Follow up in 3 months     All questions answered. Patient expressed understanding and agreement.   I appreciate the opportunity to participate in this patient's care.    Dr. Malachy Chamber  Staff Endocrinologist  [facility]    [facility]  [address]   [place] [address]  P: [phone]  F: [phone]    [facility]  [address]  [place], [place] [address]    On the day of the encounter, a total of 60 minutes was spent on this patient encounter including review of historical information, examination, documentation and post-visit activities. The time documented excludes procedural time.    Electronically signed by Malachy Chamber, MD    09/08/23

## 2023-09-08 NOTE — Telephone Encounter (Signed)
I called to check PA on pt's dexa scan. Per [facility] medicare no PA was required for [id number].   Cornelius Moras Ref # [id number]  Pt is scheduled for 10-26-23 at 10:30 am. Breast center [facility]. Pt called and made aware. Order was faxed.               Last scheduled appointment with you was 09/08/2023.  Currently scheduled future appointment is 12/12/2023.      Bryson Ha, MA  09/08/2023, 12:10

## 2023-09-08 NOTE — Patient Instructions (Addendum)
Decrease levothyroxine 1 tablet 6 days a week.     Thyroid hormone should be taken daily on an empty stomach 30-60 minutes before breakfast and separated from supplements such as multivitamins, calcium, and iron by at least four hours.     Antacids, such as Nexium, Prilosec, Protonix, should be taken at least one hour apart from levothyroxine and caffeine should be avoided for 30-60 minutes following levothyroxine.    Please get a bone density DXA scan done. We will help schedule this through [facility].       Aim to get at least 1000 -1200 mg calcium intake daily in diet. If not able to get through diet, can supplement with calcium carbonate or calcium citrate 500 mg daily over the counter.       Continue current vitamin D therapy.     If taking biotin or multivitamin, please hold it for 4 days prior to bloodwork.     - get lab work 8 am fasting in 6 weeks.     Repeat labs in 3 months.          Useful Information Regarding Hair Loss in Women:    Hair loss, or thinning is a very common problem.  More than one third of women notice significant hair loss during their lifetime.     Hair Biology  The scalp contains, on average, 100,000 hairs. More than 90% of these hairs are actively growing, and they are referred to as anagen hairs. Anagen hairs are anchored deeply under the skin and cannot be pulled out easily. Hair is constantly cycling and regenerating on the scalp. Each hair shaft may persist on the scalp for 3 to 7 years before falling out and being replaced by a new hair. The anagen phase, which lasts for most of this period, is followed by a 2-week phase of catagen, during which there is programmed death of hair follicle; the trigger factor for catagen is unknown. After catagen, the hair goes into telogen, a resting phase that lasts 3 months. As compared with anagen hair, telogen hair is located higher in the skin and can be pulled out relatively easily. Normally, the scalp loses approximately  100 telogen hairs per day.    In addition to the ratio of anagen hair to telogen hair, the diameter of the hair follicles determines scalp coverage.     Common Causes of Hair Loss in Women  The most common cause of  hair loss is female-pattern hair loss (also referred to as androgenetic alopecia). While androgens stand for the female hormone, the majority of women with female-pattern hair loss have normal levels of the female hormone. Female-pattern hair loss usually runs in families, and it can develop any time after the onset of puberty.  By 70 years of age, about 40% of women have female-pattern hair loss. It affects the top of the head, usually sparing the hairline. In some women, hair thinning over the sides of the scalp also occurs.    Another common cause of hair loss is telogen effluvium. This condition results from an abrupt shift of large numbers of anagen (or growing) hairs to telogen (or resting phase) hairs on the scalp, with a corresponding change in the ratio of anagen hair to telogen hair from the normal ratio of 90:10 to 70:30. It is not unusual for women with telogen effluvium to lose more than 300 hairs per day. This form of hair loss generally begins approximately 3 months after a major  illness or other stress (e.g., surgery, child birth, rapid weight loss, nutritional deficiency, high fever, or hemorrhage) or hormonal derangement (e.g., thyroid problems). Telogen effluvium has also been reported after treatment with certain medications.   If the cause of telogen effluvium is removed, hair loss lasts for up to 6 months after removal of the trigger; however, in some patients, this type of hair loss lasts for years.  Telogen effluvium may evolve into or reveal female-pattern hair loss.  A factor that may help distinguish between telogen effluvium and female-pattern hair loss is whether hair is shedding (suggesting telogen effluvium) or is primarily thinning (suggesting female-pattern hair  loss).    Less Common Causes of Hair Loss  - Iron deficiency: some studies suggest that this may be associated with hair loss.  - Thyroid disease: including hyperthyroidism or hypothyroidism    Laboratory Testing   If no testing has been performed, we will order blood work to test for thyroid disease, iron deficiency anemia, and will check testosterone level.     Treatment  If laboratory results are good, the following treatment options can be considered:  1. Some experts treat female pattern hair loss and telogen effluvium with topical 5% minoxidil (women's Rogaine) solution twice daily, with the plan to continue this treatment indefinitely if there is evidence of benefit within 1 year of use.   2. Another option is Antiandrogen (testosterone blocking) medications, such as spironolactone.   3. If the results are unsatisfactory and the patient is distressed by hair loss, hair transplantation may be considered, if this procedure is available and affordable to the patient     Minoxidil Solution  Topical minoxidil solution is approved by the Food and Drug Administration (FDA) for women with thinning hair due to female-pattern hair loss. The 5% formulation is more effective than the 2%.  Treatment should continue for 6 months to notice improvement.  Side effects of topical minoxidil include contact dermatitis (less common with the foam formulation). Minoxidil should not be used in pregnant or nursing women.    Other Medications  Antiandrogen (testosterone blocking) treatments, such as spironolactone, and birth control pills are not commonly used to treat female-pattern hair loss in [place], but they are used more commonly in [place]. None of these agents are FDA-approved for female-pattern hair loss.     Hair Transplantation  Hair surgery is increasingly used to treat women with female-pattern hair loss.  Clinical experience indicates that when the newer technique of hair transplantation is performed by an  experienced surgeon, a natural result is possible. However, the high cost and lack of insurance coverage are major barriers. Cost of hair transplantation vary, but may range from $4,000 to $15,000 per session.      The [employer] site (www.americanhairresearchsociety.org) is a resource for patients with hair loss.  ----------------------------------  Source: Macy Mis J Med. 2007 Oct 18;357(16):1620-30. (Clinical Practice: Hair loss in women)

## 2023-09-08 NOTE — Assessment & Plan Note (Signed)
Replete on vitamin D 5000 international units daily    - goal >30ng/mL  - discussed can reduce to 2000 international units once runs out

## 2023-09-08 NOTE — Assessment & Plan Note (Signed)
Risk factors osteoporosis including advancing age, gender, vitamin D deficiency  Last DXA 2021; T score -2.2 at left hip    - due for follow up DXA now  - encouraged adequate calcium intake, continue vitamin D   - encouraged weight bearing activity  - discussed if high risk of FRAX or DXA showing osteoporosis would benefit from medical therapy to prevent further bone degradation

## 2023-09-14 ENCOUNTER — Other Ambulatory Visit: Payer: Self-pay | Admitting: Medical

## 2023-09-14 ENCOUNTER — Other Ambulatory Visit: Payer: Self-pay | Admitting: Nurse Practitioner

## 2023-09-14 ENCOUNTER — Other Ambulatory Visit: Payer: Self-pay | Admitting: Family Medicine

## 2023-09-14 DIAGNOSIS — W57XXXA Bitten or stung by nonvenomous insect and other nonvenomous arthropods, initial encounter: Secondary | ICD-10-CM

## 2023-09-14 NOTE — Telephone Encounter (Signed)
Pt was taken off meds

## 2023-09-20 ENCOUNTER — Other Ambulatory Visit (INDEPENDENT_AMBULATORY_CARE_PROVIDER_SITE_OTHER): Payer: Self-pay

## 2023-09-20 DIAGNOSIS — M8580 Other specified disorders of bone density and structure, unspecified site: Secondary | ICD-10-CM

## 2023-09-20 DIAGNOSIS — E039 Hypothyroidism, unspecified: Secondary | ICD-10-CM

## 2023-09-20 DIAGNOSIS — L659 Nonscarring hair loss, unspecified: Secondary | ICD-10-CM

## 2023-09-20 DIAGNOSIS — E559 Vitamin D deficiency, unspecified: Secondary | ICD-10-CM

## 2023-09-20 LAB — THYROID STIMULATING HORMONE (SENSITIVE TSH): TSH: 1.465

## 2023-09-20 LAB — FERRITIN: FERRITIN: 177

## 2023-09-20 LAB — THYROXINE, FREE (FREE T4): THYROXINE, FREE (FREE T4): 1.29

## 2023-09-20 LAB — VITAMIN D 25 TOTAL: VITAMIN D,25-OH,TOTAL,IA: 64.8

## 2023-09-21 ENCOUNTER — Ambulatory Visit: Payer: Medicare PPO | Admitting: Orthopedic Surgery

## 2023-09-21 ENCOUNTER — Other Ambulatory Visit (INDEPENDENT_AMBULATORY_CARE_PROVIDER_SITE_OTHER): Payer: Self-pay

## 2023-09-21 ENCOUNTER — Telehealth: Payer: Self-pay

## 2023-09-21 DIAGNOSIS — M1712 Unilateral primary osteoarthritis, left knee: Secondary | ICD-10-CM

## 2023-09-21 DIAGNOSIS — M542 Cervicalgia: Secondary | ICD-10-CM | POA: Diagnosis not present

## 2023-09-21 DIAGNOSIS — M25512 Pain in left shoulder: Secondary | ICD-10-CM

## 2023-09-21 DIAGNOSIS — M25562 Pain in left knee: Secondary | ICD-10-CM

## 2023-09-21 NOTE — Telephone Encounter (Signed)
Auth needed for left knee gel 

## 2023-09-24 ENCOUNTER — Encounter: Payer: Self-pay | Admitting: Orthopedic Surgery

## 2023-09-24 MED ORDER — METHYLPREDNISOLONE ACETATE 40 MG/ML IJ SUSP
20.0000 mg | INTRAMUSCULAR | Status: AC | PRN
  Administered 2023-09-21: 20 mg via INTRA_ARTICULAR

## 2023-09-24 MED ORDER — BUPIVACAINE HCL 0.25 % IJ SOLN
4.0000 mL | INTRAMUSCULAR | Status: AC | PRN
  Administered 2023-09-21: 4 mL via INTRA_ARTICULAR

## 2023-09-24 MED ORDER — LIDOCAINE HCL 1 % IJ SOLN
5.0000 mL | INTRAMUSCULAR | Status: AC | PRN
  Administered 2023-09-21: 5 mL

## 2023-09-24 NOTE — Progress Notes (Signed)
Office Visit Note   Patient: Courtney Barry           Date of Birth: [birth year]           MRN: [id number] Visit Date: 09/21/2023 Requested by: Jac Canavan, PA-C [address] [place],  [place] [address] PCP: Jac Canavan, PA-C  Subjective: Chief Complaint  Patient presents with   Left Shoulder - Pain   Left Knee - Pain    HPI: Courtney Barry is a 72 y.o. female who presents to the office reporting left knee pain as well as left shoulder and neck pain.  Patient states that she injured her left knee 07/04/2022.  She stepped wrong off the curb.  Developed some swelling in the knee.  She does report stiffness weakness some "locking" as well as giving way.  Hard for her to fully weight-bear without pain.  No problems before the injury on 1023.  Patient also reports left shoulder and neck pain that comes and goes.  Denies much in the way of radiation down the arm.  Does report some scapular pain as well as neck pain.  Does report decreased range of motion and decreased strength in that left arm.  She did get significant relief with the massage.  Patient does have diabetes with blood glucose running in the 1 30-1 40 range.  Does not take Ozempic injections..                ROS: All systems reviewed are negative as they relate to the chief complaint within the history of present illness.  Patient denies fevers or chills.  Assessment & Plan: Visit Diagnoses:  1. Left shoulder pain, unspecified chronicity   2. Neck pain   3. Left knee pain, unspecified chronicity     Plan: Impression is medial compartment arthritis in the left knee with the injury and October likely exacerbating some existing arthritis.  We will try a diminished dose cortisone injection into the left knee today with aspiration.  Would also like to preapproved for gel injection in that left knee.  Her neck and shoulder currently are stable but she does have degenerative arthritis and loss of lordosis in the  neck with normal shoulder examination and radiographs.  I think this could be radiculopathy.  Since that is better going to hold off on further workup for now.  We could consider diagnostic and therapeutic shoulder injection versus MRI scan and epidural steroid injections into the cervical spine when she comes back for her gel injection in the left knee.  Follow-Up Instructions: No follow-ups on file.   Orders:  Orders Placed This Encounter  Procedures   XR Shoulder Left   XR Cervical Spine 2 or 3 views   XR KNEE 3 VIEW LEFT   No orders of the defined types were placed in this encounter.     Procedures: Large Joint Inj: L knee on 09/21/2023 7:43 AM Indications: diagnostic evaluation, joint swelling and pain Details: 18 G 1.5 in needle, superolateral approach  Arthrogram: No  Medications: 5 mL lidocaine 1 %; 20 mg methylPREDNISolone acetate 40 MG/ML; 4 mL bupivacaine 0.25 % Outcome: tolerated well, no immediate complications Procedure, treatment alternatives, risks and benefits explained, specific risks discussed. Consent was given by the patient. Immediately prior to procedure a time out was called to verify the correct patient, procedure, equipment, support staff and site/side marked as required. Patient was prepped and draped in the usual sterile fashion.  Clinical Data: No additional findings.  Objective: Vital Signs: There were no vitals taken for this visit.  Physical Exam:  Constitutional: Patient appears well-developed HEENT:  Head: Normocephalic Eyes:EOM are normal Neck: Normal range of motion Cardiovascular: Normal rate Pulmonary/chest: Effort normal Neurologic: Patient is alert Skin: Skin is warm Psychiatric: Patient has normal mood and affect  Ortho Exam: Ortho exam demonstrates full cervical spine range of motion.  5 out of 5 grip EPL FPL interosseous wrist flexion extension bicep triceps and deltoid strength.  No definite paresthesias C5-T1.  No masses  lymphadenopathy or skin changes noted in the shoulder girdle region.  She has excellent range of motion of the both shoulders 60/95/170.  Rotator cuff strength is intact to infraspinatus supraspinatus and subscap muscle testing.  No coarseness or grinding with internal and external rotation of the shoulder at 90 degrees of abduction.  No AC joint tenderness.  Left knee demonstrates trace effusion with medial joint line tenderness.  Collateral and cruciate ligaments are stable.  Range of motion is full.  McMurray compression testing positive for medial compartment pathology compared to lateral.  No groin pain on the left with internal and external rotation of the leg.  No other masses lymphadenopathy or skin changes noted in that left knee region. This patient is diagnosed with osteoarthritis of the knee(s).    Radiographs show evidence of joint space narrowing, osteophytes, subchondral sclerosis and/or subchondral cysts.  This patient has knee pain which interferes with functional and activities of daily living.    This patient has experienced inadequate response, adverse effects and/or intolerance with conservative treatments such as acetaminophen, NSAIDS, topical creams, physical therapy or regular exercise, knee bracing and/or weight loss.   This patient has experienced inadequate response or has a contraindication to intra articular steroid injections for at least 3 months.   This patient is not scheduled to have a total knee replacement within 6 months of starting treatment with viscosupplementation.   Specialty Comments:  No specialty comments available.  Imaging: No results found.   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   PVD (peripheral vascular disease) (HCC) 05/21/2021   Abnormal ankle brachial index ([name]) 05/21/2021   Situational  anxiety 11/21/2019   Hyperlipidemia associated with type 2 diabetes mellitus (HCC) 11/21/2019   Uncontrolled type 2 diabetes mellitus with hyperglycemia (HCC) 09/24/2019   Essential hypertension, benign 09/24/2019   Hypothyroidism 09/24/2019   Menopausal vaginal dryness    Cyst of vulva 09/11/2015   Epidermoid cyst of skin 09/11/2015   Melanocytic nevus 09/11/2015   Dysplastic nevus of skin 09/11/2015   Past Medical History:  Diagnosis Date   Arthritis    generalized- RIGHT hip   Diabetes mellitus, type II (HCC)    Encounter for specialized medical examination 09/04/2013   Hyperlipidemia    on meds   Hypertension    on meds   Hypothyroidism    on meds   Menopausal vaginal dryness    Normal gynecologic examination 09/05/2014   Seasonal allergies    Vaccine counseling 05/21/2021    Family History  Problem Relation Age of Onset   Colon polyps Mother 57   Thyroid disease Mother    Cancer Mother    Pancreatic cancer Mother 51   Hypertension Father    Kidney disease Father    Thyroid cancer Sister  died at 1   Colon cancer Neg Hx    Esophageal cancer Neg Hx    Stomach cancer Neg Hx    Rectal cancer Neg Hx     Past Surgical History:  Procedure Laterality Date   ABDOMINAL HYSTERECTOMY     APPENDECTOMY     CHOLECYSTECTOMY     COLONOSCOPY  2014   Dr.Richardson-hems/tics/abnormal rectal exam-good prep   TONSILLECTOMY     Social History   Occupational History   Not on file  Tobacco Use   Smoking status: Never   Smokeless tobacco: Never  Vaping Use   Vaping status: Never Used  Substance and Sexual Activity   Alcohol use: Never   Drug use: Never   Sexual activity: Yes    Partners: Male    Comment: 1st intercourse- 25, partners- 1, married- 44 yrs,

## 2023-09-26 ENCOUNTER — Telehealth: Payer: Self-pay

## 2023-09-26 NOTE — Telephone Encounter (Signed)
Noted. VOB has been submitted for Monovisc, left knee

## 2023-09-26 NOTE — Telephone Encounter (Signed)
Previously submitted  

## 2023-09-26 NOTE — Telephone Encounter (Signed)
-----   Message from Burnard Bunting sent at 09/24/2023  7:50 AM EST ----- Hi Corinna Burkman can you have Marice come back in 6 weeks.  Thanks.  For gel injection.

## 2023-09-26 NOTE — Telephone Encounter (Signed)
Please make sure we have auth for gel per Dr Alfonso Patten note.

## 2023-10-21 ENCOUNTER — Encounter: Payer: Self-pay | Admitting: Obstetrics and Gynecology

## 2023-10-21 ENCOUNTER — Ambulatory Visit: Payer: Medicare PPO | Admitting: Obstetrics & Gynecology

## 2023-10-21 ENCOUNTER — Ambulatory Visit (INDEPENDENT_AMBULATORY_CARE_PROVIDER_SITE_OTHER): Payer: Medicare PPO | Admitting: Obstetrics and Gynecology

## 2023-10-21 VITALS — BP 118/60 | HR 92 | Ht 63.75 in | Wt 168.0 lb

## 2023-10-21 DIAGNOSIS — Z01419 Encounter for gynecological examination (general) (routine) without abnormal findings: Secondary | ICD-10-CM | POA: Insufficient documentation

## 2023-10-21 NOTE — Assessment & Plan Note (Signed)
Cervical cancer screening performed according to ASCCP guidelines. Encouraged annual mammogram screening Colonoscopy UTD DXA UTD Labs and immunizations with her primary Encouraged safe sexual practices as indicated Encouraged healthy lifestyle practices with diet and exercise For patients under 50-70yo, I recommend 1200mg  calcium daily and 600IU of vitamin D daily.

## 2023-10-21 NOTE — Progress Notes (Signed)
72 y.o. G2P2 postmenopausal female s/p TAH, BSO here for annual exam. Married. Retired. Had 3 granddaughters, middle to HS age.  Doing well, no complaints. Stopped using PV estrogen because sx improved.  Postmenopausal bleeding: none Pelvic discharge or pain: none Breast mass, nipple discharge or skin changes : none Last PAP: No results found for: "DIAGPAP", "HPVHIGH", "ADEQPAP" Last mammogram: 01/28/2023 BIRADS 1, density c Last colonoscopy: 07/11/2023, q53yr Last DXA: 10/22/2020 normal Sexually active: no, husband previously dx with prostate cancer Exercising: yes, walking Smoker: no  GYN HISTORY: S/p hysterectomy  OB History  Gravida Para Term Preterm AB Living  2 2       2   SAB IAB Ectopic Multiple Live Births               # Outcome Date GA Lbr Len/2nd Weight Sex Type Anes PTL Lv  2 Para           1 Para             Past Medical History:  Diagnosis Date   Arthritis    generalized- RIGHT hip   Diabetes mellitus, type II (HCC)    Encounter for specialized medical examination 09/04/2013   Hyperlipidemia    on meds   Hypertension    on meds   Hypothyroidism    on meds   Menopausal vaginal dryness    Normal gynecologic examination 09/05/2014   Seasonal allergies    Vaccine counseling 05/21/2021    Past Surgical History:  Procedure Laterality Date   ABDOMINAL HYSTERECTOMY     APPENDECTOMY     CHOLECYSTECTOMY     COLONOSCOPY  2014   Dr.Richardson-hems/tics/abnormal rectal exam-good prep   TONSILLECTOMY      Current Outpatient Medications on File Prior to Visit  Medication Sig Dispense Refill   Accu-Chek Softclix Lancets lancets Use as instructed to monitor glucose twice daily 100 each 12   atorvastatin (LIPITOR) 40 MG tablet TAKE 1 TABLET BY MOUTH EVERY DAY 90 tablet 1   Blood Glucose Monitoring Suppl (ACCU-CHEK GUIDE ME) w/Device KIT Use as directed to check blood glucose twice daily before breakfast and bed. DX E11.65 1 kit 0   Cholecalciferol (VITAMIN  D) 125 MCG (5000 UT) CAPS Take 1 capsule by mouth daily.     glucose blood (ACCU-CHEK GUIDE) test strip USE TO CHECK BLOOD GLUCOSE TWICE DAILY 200 strip 2   Insulin Pen Needle (BD PEN NEEDLE NANO U/F) 32G X 4 MM MISC 1 Units by Does not apply route daily at 2 PM. 100 each 3   loratadine (CLARITIN) 10 MG tablet Take 10 mg by mouth daily as needed for allergies, rhinitis or itching.     losartan (COZAAR) 25 MG tablet TAKE 1 TABLET (25 MG TOTAL) BY MOUTH DAILY. 90 tablet 1   Multiple Vitamin (MULTIVITAMIN) tablet Take 1 tablet by mouth daily.     Semaglutide, 2 MG/DOSE, 8 MG/3ML SOPN Inject 2 mg as directed once a week. 9 mL 1   SYNTHROID 112 MCG tablet TAKE 1 TABLET BY MOUTH EVERY DAY 90 tablet 1   No current facility-administered medications on file prior to visit.    Social History   Socioeconomic History   Marital status: Married    Spouse name: Not on file   Number of children: Not on file   Years of education: Not on file   Highest education level: Not on file  Occupational History   Not on file  Tobacco Use  Smoking status: Never   Smokeless tobacco: Never  Vaping Use   Vaping status: Never Used  Substance and Sexual Activity   Alcohol use: Never   Drug use: Never   Sexual activity: Yes    Partners: Male    Comment: 1st intercourse- 3, partners- 1, married- 44 yrs,  Other Topics Concern   Not on file  Social History Narrative   Not on file   Social Determinants of Health   Financial Resource Strain: Low Risk  (11/23/2022)   Overall Financial Resource Strain (CARDIA)    Difficulty of Paying Living Expenses: Not hard at all  Food Insecurity: No Food Insecurity (11/23/2022)   Hunger Vital Sign    Worried About Running Out of Food in the Last Year: Never true    Ran Out of Food in the Last Year: Never true  Transportation Needs: No Transportation Needs (11/23/2022)   PRAPARE - Administrator, Civil Service (Medical): No    Lack of Transportation (Non-Medical):  No  Physical Activity: Inactive (11/23/2022)   Exercise Vital Sign    Days of Exercise per Week: 0 days    Minutes of Exercise per Session: 0 min  Stress: No Stress Concern Present (11/23/2022)   [employer] of Occupational Health - Occupational Stress Questionnaire    Feeling of Stress : Not at all  Social Connections: Not on file  Intimate Partner Violence: Not on file    Family History  Problem Relation Age of Onset   Colon polyps Mother 80   Thyroid disease Mother    Cancer Mother    Pancreatic cancer Mother 51   Hypertension Father    Kidney disease Father    Thyroid cancer Sister        died at 3   Colon cancer Neg Hx    Esophageal cancer Neg Hx    Stomach cancer Neg Hx    Rectal cancer Neg Hx     Allergies  Allergen Reactions   Latex     Gloves, band aids- causes rash   Sulfa Antibiotics Rash      PE Today's Vitals   10/21/23 0920  BP: 118/60  Pulse: 92  SpO2: 97%  Weight: 168 lb (76.2 kg)  Height: 5' 3.75" (1.619 m)   Body mass index is 29.06 kg/m.  Physical Exam Vitals reviewed. Exam conducted with a chaperone present.  Constitutional:      General: She is not in acute distress.    Appearance: Normal appearance.  HENT:     Head: Normocephalic and atraumatic.     Nose: Nose normal.  Eyes:     Extraocular Movements: Extraocular movements intact.     Conjunctiva/sclera: Conjunctivae normal.  Neck:     Thyroid: No thyroid mass, thyromegaly or thyroid tenderness.  Pulmonary:     Effort: Pulmonary effort is normal.  Chest:     Chest wall: No mass or tenderness.  Breasts:    Right: Normal. No swelling, mass, nipple discharge or tenderness.     Left: Normal. No swelling, mass, nipple discharge or tenderness.  Abdominal:     General: There is no distension.     Palpations: Abdomen is soft.     Tenderness: There is no abdominal tenderness.  Genitourinary:    General: Normal vulva.     Exam position: Lithotomy position.     Urethra: No  prolapse.     Vagina: Normal. No vaginal discharge or bleeding.     Cervix: No lesion.  Adnexa: Right adnexa normal and left adnexa normal.     Comments: Cervix and uterus absent Musculoskeletal:        General: Normal range of motion.     Cervical back: Normal range of motion.  Lymphadenopathy:     Upper Body:     Right upper body: No axillary adenopathy.     Left upper body: No axillary adenopathy.     Lower Body: No right inguinal adenopathy. No left inguinal adenopathy.  Skin:    General: Skin is warm and dry.  Neurological:     General: No focal deficit present.     Mental Status: She is alert.  Psychiatric:        Mood and Affect: Mood normal.        Behavior: Behavior normal.       Assessment and Plan:        Well woman exam with routine gynecological exam Assessment & Plan: Cervical cancer screening performed according to ASCCP guidelines. Encouraged annual mammogram screening Colonoscopy UTD DXA UTD Labs and immunizations with her primary Encouraged safe sexual practices as indicated Encouraged healthy lifestyle practices with diet and exercise For patients under 50-70yo, I recommend 1200mg  calcium daily and 600IU of vitamin D daily.    Rosalyn Gess, MD

## 2023-10-21 NOTE — Patient Instructions (Signed)
For patients under 50-70yo, I recommend 1200mg  calcium daily and 600IU of vitamin D daily. For patients over 70yo, I recommend 1200mg  calcium daily and 800IU of vitamin D daily.  Health Maintenance, Female Adopting a healthy lifestyle and getting preventive care are important in promoting health and wellness. Ask your health care provider about: The right schedule for you to have regular tests and exams. Things you can do on your own to prevent diseases and keep yourself healthy. What should I know about diet, weight, and exercise? Eat a healthy diet  Eat a diet that includes plenty of vegetables, fruits, low-fat dairy products, and lean protein. Do not eat a lot of foods that are high in solid fats, added sugars, or sodium. Maintain a healthy weight Body mass index (BMI) is used to identify weight problems. It estimates body fat based on height and weight. Your health care provider can help determine your BMI and help you achieve or maintain a healthy weight. Get regular exercise Get regular exercise. This is one of the most important things you can do for your health. Most adults should: Exercise for at least 150 minutes each week. The exercise should increase your heart rate and make you sweat (moderate-intensity exercise). Do strengthening exercises at least twice a week. This is in addition to the moderate-intensity exercise. Spend less time sitting. Even light physical activity can be beneficial. Watch cholesterol and blood lipids Have your blood tested for lipids and cholesterol at 70 years of age, then have this test every 5 years. Have your cholesterol levels checked more often if: Your lipid or cholesterol levels are high. You are older than 70 years of age. You are at high risk for heart disease. What should I know about cancer screening? Depending on your health history and family history, you may need to have cancer screening at various ages. This may include screening  for: Breast cancer. Cervical cancer. Colorectal cancer. Skin cancer. Lung cancer. What should I know about heart disease, diabetes, and high blood pressure? Blood pressure and heart disease High blood pressure causes heart disease and increases the risk of stroke. This is more likely to develop in people who have high blood pressure readings or are overweight. Have your blood pressure checked: Every 3-5 years if you are 83-70 years of age. Every year if you are 4 years old or older. Diabetes Have regular diabetes screenings. This checks your fasting blood sugar level. Have the screening done: Once every three years after age 68 if you are at a normal weight and have a low risk for diabetes. More often and at a younger age if you are overweight or have a high risk for diabetes. What should I know about preventing infection? Hepatitis B If you have a higher risk for hepatitis B, you should be screened for this virus. Talk with your health care provider to find out if you are at risk for hepatitis B infection. Hepatitis C Testing is recommended for: Everyone born from [birth year] through 1963. Anyone with known risk factors for hepatitis C. Sexually transmitted infections (STIs) Get screened for STIs, including gonorrhea and chlamydia, if: You are sexually active and are younger than 70 years of age. You are older than 70 years of age and your health care provider tells you that you are at risk for this type of infection. Your sexual activity has changed since you were last screened, and you are at increased risk for chlamydia or gonorrhea. Ask your health care provider if  you are at risk. Ask your health care provider about whether you are at high risk for HIV. Your health care provider may recommend a prescription medicine to help prevent HIV infection. If you choose to take medicine to prevent HIV, you should first get tested for HIV. You should then be tested every 3 months for as long as you  are taking the medicine. Osteoporosis and menopause Osteoporosis is a disease in which the bones lose minerals and strength with aging. This can result in bone fractures. If you are 44 years old or older, or if you are at risk for osteoporosis and fractures, ask your health care provider if you should: Be screened for bone loss. Take a calcium or vitamin D supplement to lower your risk of fractures. Be given hormone replacement therapy (HRT) to treat symptoms of menopause. Follow these instructions at home: Alcohol use Do not drink alcohol if: Your health care provider tells you not to drink. You are pregnant, may be pregnant, or are planning to become pregnant. If you drink alcohol: Limit how much you have to: 0-1 drink a day. Know how much alcohol is in your drink. In the [place] one drink equals one 12 oz bottle of beer (355 mL), one 5 oz glass of wine (148 mL), or one 1 oz glass of hard liquor (44 mL). Lifestyle Do not use any products that contain nicotine or tobacco. These products include cigarettes, chewing tobacco, and vaping devices, such as e-cigarettes. If you need help quitting, ask your health care provider. Do not use street drugs. Do not share needles. Ask your health care provider for help if you need support or information about quitting drugs. General instructions Schedule regular health, dental, and eye exams. Stay current with your vaccines. Tell your health care provider if: You often feel depressed. You have ever been abused or do not feel safe at home. Summary Adopting a healthy lifestyle and getting preventive care are important in promoting health and wellness. Follow your health care provider's instructions about healthy diet, exercising, and getting tested or screened for diseases. Follow your health care provider's instructions on monitoring your cholesterol and blood pressure. This information is not intended to replace advice given to you by your health  care provider. Make sure you discuss any questions you have with your health care provider. Document Revised: 03/23/2021 Document Reviewed: 03/23/2021 Elsevier Patient Education  2024 [employer].

## 2023-11-02 ENCOUNTER — Other Ambulatory Visit: Payer: Self-pay | Admitting: Nurse Practitioner

## 2023-11-02 DIAGNOSIS — Z794 Long term (current) use of insulin: Secondary | ICD-10-CM

## 2023-11-02 DIAGNOSIS — Z7985 Long-term (current) use of injectable non-insulin antidiabetic drugs: Secondary | ICD-10-CM

## 2023-11-02 DIAGNOSIS — E1165 Type 2 diabetes mellitus with hyperglycemia: Secondary | ICD-10-CM

## 2023-11-24 DIAGNOSIS — E039 Hypothyroidism, unspecified: Secondary | ICD-10-CM | POA: Diagnosis not present

## 2023-11-24 DIAGNOSIS — E1165 Type 2 diabetes mellitus with hyperglycemia: Secondary | ICD-10-CM | POA: Diagnosis not present

## 2023-11-25 LAB — T4, FREE: Free T4: 1.67 ng/dL (ref 0.82–1.77)

## 2023-11-25 LAB — COMPREHENSIVE METABOLIC PANEL
ALT: 23 [IU]/L (ref 0–32)
AST: 17 [IU]/L (ref 0–40)
Albumin: 4 g/dL (ref 3.9–4.9)
Alkaline Phosphatase: 84 [IU]/L (ref 44–121)
BUN/Creatinine Ratio: 20 (ref 12–28)
BUN: 16 mg/dL (ref 8–27)
Bilirubin Total: 0.7 mg/dL (ref 0.0–1.2)
CO2: 23 mmol/L (ref 20–29)
Calcium: 9.6 mg/dL (ref 8.7–10.3)
Chloride: 106 mmol/L (ref 96–106)
Creatinine, Ser: 0.81 mg/dL (ref 0.57–1.00)
Globulin, Total: 2.6 g/dL (ref 1.5–4.5)
Glucose: 150 mg/dL — ABNORMAL HIGH (ref 70–99)
Potassium: 4.2 mmol/L (ref 3.5–5.2)
Sodium: 142 mmol/L (ref 134–144)
Total Protein: 6.6 g/dL (ref 6.0–8.5)
eGFR: 78 mL/min/{1.73_m2} (ref >59)

## 2023-11-25 LAB — TSH: TSH: 0.452 u[IU]/mL (ref 0.450–4.500)

## 2023-11-28 ENCOUNTER — Ambulatory Visit: Payer: Medicare PPO | Admitting: Nurse Practitioner

## 2023-12-01 ENCOUNTER — Encounter: Payer: Medicare PPO | Admitting: Nutrition

## 2023-12-01 ENCOUNTER — Ambulatory Visit: Payer: Medicare PPO | Admitting: Nurse Practitioner

## 2023-12-05 ENCOUNTER — Encounter: Payer: Self-pay | Admitting: Nurse Practitioner

## 2023-12-05 ENCOUNTER — Ambulatory Visit: Payer: Medicare PPO | Admitting: Nurse Practitioner

## 2023-12-05 ENCOUNTER — Encounter: Payer: Medicare PPO | Attending: Medical | Admitting: Nutrition

## 2023-12-05 VITALS — BP 119/73 | HR 85 | Ht 63.75 in | Wt 168.0 lb

## 2023-12-05 VITALS — Ht 64.0 in | Wt 168.0 lb

## 2023-12-05 DIAGNOSIS — E039 Hypothyroidism, unspecified: Secondary | ICD-10-CM

## 2023-12-05 DIAGNOSIS — E669 Obesity, unspecified: Secondary | ICD-10-CM | POA: Diagnosis not present

## 2023-12-05 DIAGNOSIS — E1165 Type 2 diabetes mellitus with hyperglycemia: Secondary | ICD-10-CM | POA: Insufficient documentation

## 2023-12-05 DIAGNOSIS — E559 Vitamin D deficiency, unspecified: Secondary | ICD-10-CM | POA: Diagnosis not present

## 2023-12-05 DIAGNOSIS — Z794 Long term (current) use of insulin: Secondary | ICD-10-CM | POA: Diagnosis not present

## 2023-12-05 DIAGNOSIS — E782 Mixed hyperlipidemia: Secondary | ICD-10-CM | POA: Insufficient documentation

## 2023-12-05 DIAGNOSIS — I10 Essential (primary) hypertension: Secondary | ICD-10-CM | POA: Diagnosis not present

## 2023-12-05 DIAGNOSIS — Z7985 Long-term (current) use of injectable non-insulin antidiabetic drugs: Secondary | ICD-10-CM | POA: Diagnosis not present

## 2023-12-05 LAB — POCT GLYCOSYLATED HEMOGLOBIN (HGB A1C): Hemoglobin A1C: 7.7 % — AB (ref 4.0–5.6)

## 2023-12-05 MED ORDER — BD PEN NEEDLE NANO U/F 32G X 4 MM MISC: 1.0000 [IU] | Freq: Every day | 3 refills | Status: AC

## 2023-12-05 MED ORDER — TRESIBA FLEXTOUCH 100 UNIT/ML ~~LOC~~ SOPN: 25.0000 [IU] | PEN_INJECTOR | Freq: Every day | SUBCUTANEOUS | 3 refills | Status: DC

## 2023-12-05 MED ORDER — SYNTHROID 112 MCG PO TABS: 112.0000 ug | ORAL_TABLET | Freq: Every day | ORAL | 1 refills | Status: DC

## 2023-12-05 NOTE — Patient Instructions (Signed)

## 2023-12-05 NOTE — Progress Notes (Unsigned)
Medical Nutrition Therapy:  Appt start time: 1030 end time:   1100  Assessment:  Primary concerns today: Diabetes Type 2 x 15 years.  Reports not doing as well with her food over the holidays. Blood sugars have been elevated due to a cortisone shot . Ozempic 2.0 now. A1C up to 7.7% from 7.5%. No weight loss in the last month. Is constipated some. Saw Ronny Bacon today with [facility].   Wt Readings from Last 3 Encounters:  12/05/23 168 lb (76.2 kg)  10/21/23 168 lb (76.2 kg)  08/29/23 170 lb 6.4 oz (77.3 kg)   Ht Readings from Last 3 Encounters:  12/05/23 5' 3.75" (1.619 m)  10/21/23 5' 3.75" (1.619 m)  08/01/23 5' 3.5" (1.613 m)   There is no height or weight on file to calculate BMI. @BMIFA @ Facility age limit for growth %iles is 20 years. Facility age limit for growth %iles is 20 years.    Lab Results  Component Value Date   HGBA1C 7.7 (A) 12/05/2023   Wt Readings from Last 3 Encounters:  12/05/23 168 lb (76.2 kg)  10/21/23 168 lb (76.2 kg)  08/29/23 170 lb 6.4 oz (77.3 kg)   Ht Readings from Last 3 Encounters:  12/05/23 5' 3.75" (1.619 m)  10/21/23 5' 3.75" (1.619 m)  08/01/23 5' 3.5" (1.613 m)   There is no height or weight on file to calculate BMI. @BMIFA @ Facility age limit for growth %iles is 20 years. Facility age limit for growth %iles is 20 years.    Lab Results  Component Value Date   HGBA1C 7.7 (A) 12/05/2023      Latest Ref Rng & Units 11/24/2023    9:11 AM 03/22/2023    8:48 AM 11/22/2022    8:38 AM  CMP  Glucose 70 - 99 mg/dL 161  096  045   BUN 8 - 27 mg/dL 16  18  15    Creatinine 0.57 - 1.00 mg/dL 4.09  8.11  9.14   Sodium 134 - 144 mmol/L 142  140  142   Potassium 3.5 - 5.2 mmol/L 4.2  4.3  4.2   Chloride 96 - 106 mmol/L 106  102  105   CO2 20 - 29 mmol/L 23  21  21    Calcium 8.7 - 10.3 mg/dL 9.6  9.8  9.4   Total Protein 6.0 - 8.5 g/dL 6.6  7.1  6.8   Total Bilirubin 0.0 - 1.2 mg/dL 0.7  0.7  0.9   Alkaline Phos 44 -  121 IU/L 84  86  81   AST 0 - 40 IU/L 17  21  17    ALT 0 - 32 IU/L 23  32  30      Preferred Learning Style: No preference indicated   Learning Readiness:  Ready Change in progress  MEDICATIONS:    DIETARY INTAKE:   24-hr recall:  B ( AM): English muffin, egg whites, sausage patty, water Lunch: 3 tenders and french fries, water Dinner chicken casserole, blacked eyed peas, green beans, cornbreaf muffin and water    Beverages: water  Usual physical activity: walking.  Estimated energy needs: 1200 calories 135 g carbohydrates 90 g protein 33  g fat  Progress Towards Goal(s):  In progress.   Nutritional Diagnosis:  NB-1.1 Food and nutrition-related knowledge deficit As related to DIabetes Type 2.  As evidenced by A1C 7.4%%    Intervention:  Meal planning, portion control, high fiber diet.  Goals Increase more plant based  foods Get back with silver sneakers.-try water aerobics. Get A1C down to 7% Cut out sweets, desserts and processed foods.   Teaching Method Utilized:  Visual Auditory Hands on  Handouts given during visit include: Discussed using [employer] and [email or web address] websites for recipe ideas.  Barriers to learning/adherence to lifestyle change: none  Demonstrated degree of understanding via:  Teach Back   Monitoring/Evaluation:  Dietary intake, exercise, , and body weight in 3 month(s).

## 2023-12-05 NOTE — Progress Notes (Signed)
12/05/2023, 10:56 AM                            Endocrinology follow-up note  Subjective:    Patient ID: Courtney Barry, female    DOB: 1951/08/02.  Courtney Barry is being seen in follow-up for management of currently uncontrolled symptomatic diabetes requested by  Jac Canavan, PA-C.   Past Medical History:  Diagnosis Date   Arthritis    generalized- RIGHT hip   Diabetes mellitus, type II (HCC)    Encounter for specialized medical examination 09/04/2013   Hyperlipidemia    on meds   Hypertension    on meds   Hypothyroidism    on meds   Menopausal vaginal dryness    Normal gynecologic examination 09/05/2014   Seasonal allergies    Vaccine counseling 05/21/2021    Past Surgical History:  Procedure Laterality Date   ABDOMINAL HYSTERECTOMY     APPENDECTOMY     CHOLECYSTECTOMY     COLONOSCOPY  2014   Dr.Richardson-hems/tics/abnormal rectal exam-good prep   TONSILLECTOMY      Social History   Socioeconomic History   Marital status: Married    Spouse name: Not on file   Number of children: Not on file   Years of education: Not on file   Highest education level: Not on file  Occupational History   Not on file  Tobacco Use   Smoking status: Never   Smokeless tobacco: Never  Vaping Use   Vaping status: Never Used  Substance and Sexual Activity   Alcohol use: Never   Drug use: Never   Sexual activity: Yes    Partners: Male    Comment: 1st intercourse- 73, partners- 1, married- 73 yrs,  Other Topics Concern   Not on file  Social History Narrative   Not on file   Social Drivers of Health   Financial Resource Strain: Low Risk  (11/23/2022)   Overall Financial Resource Strain (CARDIA)    Difficulty of Paying Living Expenses: Not hard at all  Food Insecurity: No Food Insecurity (11/23/2022)   Hunger Vital Sign    Worried About Running Out of Food in the Last Year: Never  true    Ran Out of Food in the Last Year: Never true  Transportation Needs: No Transportation Needs (11/23/2022)   PRAPARE - Administrator, Civil Service (Medical): No    Lack of Transportation (Non-Medical): No  Physical Activity: Inactive (11/23/2022)   Exercise Vital Sign    Days of Exercise per Week: 0 days    Minutes of Exercise per Session: 0 min  Stress: No Stress Concern Present (11/23/2022)   [employer] of Occupational Health - Occupational Stress Questionnaire    Feeling of Stress : Not at all  Social Connections: Not on file    Family History  Problem Relation Age of Onset   Colon polyps Mother 54   Thyroid disease Mother    Cancer Mother    Pancreatic cancer Mother 52   Hypertension Father    Kidney disease Father    Thyroid cancer  Sister        died at 89   Colon cancer Neg Hx    Esophageal cancer Neg Hx    Stomach cancer Neg Hx    Rectal cancer Neg Hx     Outpatient Encounter Medications as of 12/05/2023  Medication Sig   Accu-Chek Softclix Lancets lancets Use as instructed to monitor glucose twice daily   atorvastatin (LIPITOR) 40 MG tablet TAKE 1 TABLET BY MOUTH EVERY DAY   Blood Glucose Monitoring Suppl (ACCU-CHEK GUIDE ME) w/Device KIT Use as directed to check blood glucose twice daily before breakfast and bed. DX E11.65   Cholecalciferol (VITAMIN D) 125 MCG (5000 UT) CAPS Take 1 capsule by mouth daily.   glucose blood (ACCU-CHEK GUIDE) test strip USE TO CHECK BLOOD GLUCOSE TWICE DAILY   loratadine (CLARITIN) 10 MG tablet Take 10 mg by mouth daily as needed for allergies, rhinitis or itching.   losartan (COZAAR) 25 MG tablet TAKE 1 TABLET (25 MG TOTAL) BY MOUTH DAILY.   Multiple Vitamin (MULTIVITAMIN) tablet Take 1 tablet by mouth daily.   Semaglutide, 2 MG/DOSE, 8 MG/3ML SOPN Inject 2 mg as directed once a week.   [DISCONTINUED] Insulin Pen Needle (BD PEN NEEDLE NANO U/F) 32G X 4 MM MISC 1 Units by Does not apply route daily at 2 PM.    [DISCONTINUED] SYNTHROID 112 MCG tablet TAKE 1 TABLET BY MOUTH EVERY DAY   [DISCONTINUED] TRESIBA FLEXTOUCH 100 UNIT/ML FlexTouch Pen Inject 25 Units into the skin at bedtime.   Insulin Pen Needle (BD PEN NEEDLE NANO U/F) 32G X 4 MM MISC 1 Units by Does not apply route daily at 2 PM.   SYNTHROID 112 MCG tablet Take 1 tablet (112 mcg total) by mouth daily.   TRESIBA FLEXTOUCH 100 UNIT/ML FlexTouch Pen Inject 25 Units into the skin at bedtime.   No facility-administered encounter medications on file as of 12/05/2023.    ALLERGIES: Allergies  Allergen Reactions   Latex     Gloves, band aids- causes rash   Sulfa Antibiotics Rash    VACCINATION STATUS: Immunization History  Administered Date(s) Administered   Fluad Quad(high Dose 65+) 09/10/2020, 08/14/2021, 08/03/2022   Fluad Trivalent(High Dose 65+) 08/29/2023   Influenza-Unspecified 08/09/2019   PFIZER Comirnaty(Gray Top)Covid-19 Tri-Sucrose Vaccine 05/28/2021   PFIZER(Purple Top)SARS-COV-2 Vaccination 12/31/2019, 01/02/2020, 08/25/2020   Pneumococcal Conjugate-13 11/24/2020   Pneumococcal Polysaccharide-23 01/19/2022    Diabetes She presents for her follow-up diabetic visit. She has type 2 diabetes mellitus. Onset time: She was diagnosed at approximate age of 47 years. Her disease course has been stable. There are no hypoglycemic associated symptoms. Pertinent negatives for hypoglycemia include no confusion, headaches, nervousness/anxiousness, pallor, seizures or tremors. Pertinent negatives for diabetes include no chest pain, no fatigue, no polydipsia, no polyphagia, no polyuria and no weight loss. There are no hypoglycemic complications. Symptoms are stable. Diabetic complications include nephropathy and retinopathy. Risk factors for coronary artery disease include diabetes mellitus, dyslipidemia, hypertension, post-menopausal, sedentary lifestyle and obesity. Current diabetic treatment includes insulin injections (and Ozempic). She is  compliant with treatment most of the time. Her weight is fluctuating minimally. She is following a generally healthy diet. When asked about meal planning, she reported none. She has not had a previous visit with a dietitian. She participates in exercise intermittently. Her home blood glucose trend is fluctuating minimally. Her breakfast blood glucose range is generally 130-140 mg/dl. Her bedtime blood glucose range is generally 140-180 mg/dl. (She presents today with her meter and logs showing  mostly at goal glycemic profile overall.  Her POCT A1c today is 7.7%, increasing slightly from last visit of 7.5%.  Analysis of her meter shows 7-day average of 136, 14-day average of 146, 30-day average of 149, 90-day average of 153.  She denies any hypoglycemia.  She did get steroid shot for her knee between visits, due to have the other knee injected on Wednesday.) An ACE inhibitor/angiotensin II receptor blocker is being taken. She does not see a podiatrist.Eye exam is current.  Hyperlipidemia This is a chronic problem. The current episode started more than 1 year ago. The problem is controlled. Recent lipid tests were reviewed and are normal. Exacerbating diseases include diabetes. There are no known factors aggravating her hyperlipidemia. Pertinent negatives include no chest pain, myalgias or shortness of breath. Current antihyperlipidemic treatment includes statins. The current treatment provides moderate improvement of lipids. There are no compliance problems.  Risk factors for coronary artery disease include diabetes mellitus, dyslipidemia, hypertension, a sedentary lifestyle and post-menopausal.  Thyroid Problem Presents for follow-up visit. Patient reports no anxiety, cold intolerance, constipation, depressed mood, diarrhea, fatigue, heat intolerance, leg swelling, palpitations, tremors, weight gain or weight loss. The symptoms have been stable. Her past medical history is significant for diabetes and  hyperlipidemia.  Hypertension This is a chronic problem. The current episode started more than 1 year ago. The problem has been resolved since onset. The problem is controlled. Pertinent negatives include no chest pain, headaches, palpitations or shortness of breath. Agents associated with hypertension include thyroid hormones. Risk factors for coronary artery disease include diabetes mellitus, dyslipidemia, family history, post-menopausal state and sedentary lifestyle. Past treatments include angiotensin blockers. The current treatment provides moderate improvement. There are no compliance problems.  Hypertensive end-organ damage includes retinopathy. Identifiable causes of hypertension include a thyroid problem.    Review of systems  Constitutional: + stable body weight,  current Body mass index is 29.06 kg/m. , + fatigue, no subjective hypothermia Eyes: no blurry vision, no xerophthalmia ENT: no sore throat, no nodules palpated in throat, no dysphagia/odynophagia, no hoarseness Cardiovascular: no chest pain, no shortness of breath, no palpitations, no leg swelling Respiratory: no cough, no shortness of breath Gastrointestinal: no nausea/vomiting/diarrhea Musculoskeletal: left hip pain, bilateral knee pain Skin: no rashes, no hyperemia Neurological: no tremors, no numbness, no tingling, no dizziness Psychiatric: no depression, no anxiety   Objective:    BP 119/73 (BP Location: Left Arm, Patient Position: Sitting, Cuff Size: Large)   Pulse 85   Ht 5' 3.75" (1.619 m)   Wt 168 lb (76.2 kg)   BMI 29.06 kg/m   Wt Readings from Last 3 Encounters:  12/05/23 168 lb (76.2 kg)  12/05/23 168 lb (76.2 kg)  10/21/23 168 lb (76.2 kg)    BP Readings from Last 3 Encounters:  12/05/23 119/73  10/21/23 118/60  08/29/23 122/80     Physical Exam- Limited  Constitutional:  Body mass index is 29.06 kg/m. , not in acute distress, normal state of mind Eyes:  EOMI, no  exophthalmos Musculoskeletal: no gross deformities, strength intact in all four extremities, no gross restriction of joint movements Skin:  no rashes, no hyperemia Neurological: no tremor with outstretched hands     Recent Results (from the past 2160 hours)  TSH     Status: None   Collection Time: 11/24/23  9:11 AM  Result Value Ref Range   TSH 0.452 0.450 - 4.500 uIU/mL  T4, free     Status: None   Collection Time:  11/24/23  9:11 AM  Result Value Ref Range   Free T4 1.67 0.82 - 1.77 ng/dL  Comprehensive metabolic panel     Status: Abnormal   Collection Time: 11/24/23  9:11 AM  Result Value Ref Range   Glucose 150 (H) 70 - 99 mg/dL   BUN 16 8 - 27 mg/dL   Creatinine, Ser 4.09 0.57 - 1.00 mg/dL   eGFR 78 >81 XB/JYN/8.29   BUN/Creatinine Ratio 20 12 - 28   Sodium 142 134 - 144 mmol/L   Potassium 4.2 3.5 - 5.2 mmol/L   Chloride 106 96 - 106 mmol/L   CO2 23 20 - 29 mmol/L   Calcium 9.6 8.7 - 10.3 mg/dL   Total Protein 6.6 6.0 - 8.5 g/dL   Albumin 4.0 3.9 - 4.9 g/dL   Globulin, Total 2.6 1.5 - 4.5 g/dL   Bilirubin Total 0.7 0.0 - 1.2 mg/dL   Alkaline Phosphatase 84 44 - 121 IU/L   AST 17 0 - 40 IU/L   ALT 23 0 - 32 IU/L  HgB A1c     Status: Abnormal   Collection Time: 12/05/23  9:37 AM  Result Value Ref Range   Hemoglobin A1C 7.7 (A) 4.0 - 5.6 %   HbA1c POC (<> result, manual entry)     HbA1c, POC (prediabetic range)     HbA1c, POC (controlled diabetic range)          Assessment & Plan:   1) Uncontrolled type 2 diabetes mellitus with hyperglycemia (HCC)  - Courtney Barry has currently controlled asymptomatic type 2 DM since  73 years of age.  She presents today with her meter and logs showing mostly at goal glycemic profile overall.  Her POCT A1c today is 7.7%, increasing slightly from last visit of 7.5%.  Analysis of her meter shows 7-day average of 136, 14-day average of 146, 30-day average of 149, 90-day average of 153.  She denies any hypoglycemia.  She did  get steroid shot for her knee between visits, due to have the other knee injected on Wednesday.  -her diabetes is complicated by retinopathy obesity/sedentary life and she remains at a high risk for more acute and chronic complications which include CAD, CVA, CKD, retinopathy, and neuropathy. These are all discussed in detail with her.  - Nutritional counseling repeated at each appointment due to patients tendency to fall back in to old habits.  - The patient admits there is a room for improvement in their diet and drink choices. -  Suggestion is made for the patient to avoid simple carbohydrates from their diet including Cakes, Sweet Desserts / Pastries, Ice Cream, Soda (diet and regular), Sweet Tea, Candies, Chips, Cookies, Sweet Pastries, Store Bought Juices, Alcohol in Excess of 1-2 drinks a day, Artificial Sweeteners, Coffee Creamer, and "Sugar-free" Products. This will help patient to have stable blood glucose profile and potentially avoid unintended weight gain.   - I encouraged the patient to switch to unprocessed or minimally processed complex starch and increased protein intake (animal or plant source), fruits, and vegetables.   - Patient is advised to stick to a routine mealtimes to eat 3 meals a day and avoid unnecessary snacks (to snack only to correct hypoglycemia).  - I have approached her with the following individualized plan to manage  her diabetes and patient agrees:   -She will continue to benefit from simplified treatment regimen, given her tight control of diabetes.    -She is advised to continue her Ozempi  2 mg SQ weekly and Tresiba 25 units SQ nightly.  She can increase her Tresiba to 35 units for a few days after steroid injection to help prevent A1c increases due to steroid.    -She is encouraged to routinely fingerstick twice daily, before breakfast and before bed, and to call the clinic if she has readings less than 70 or above 300 for 3 tests in a row.  She did not  tolerate CGM (adhesive allergy).  - She has allergy to sulfa medications, therefore not a candidate for glipizide.  She did have significant diarrhea with Metformin, therefore will avoid that as well.  - Specific targets for  A1c;  LDL, HDL, Triglycerides,  were discussed with the patient.  2) Blood Pressure /Hypertension:  Her blood pressure is controlled to target.  She is advised to continue Losartan 25 mg po daily.  3) Lipids/Hyperlipidemia:  Her most recent lipid panel from 04/13/22 shows controlled LDL at 50.  She is advised to continue Lipitor 40 mg po daily at bedtime.  Side effects and precautions discussed with her.    4) Hypothyroidism- longstanding diagnosis since at least from her 9s.  Her previsit TFTs are consistent with appropriate hormone replacement.   She is advised to continue Synthroid 112 mcg po daily before breakfast.  She does have family history of thyroid cancer (she is unsure of which type).  This may help determine her risk factor and if she needs to stop GLP1 therapy in the future.   - We discussed about the correct intake of her thyroid hormone, on empty stomach at fasting, with water, separated by at least 30 minutes from breakfast and other medications,  and separated by more than 4 hours from calcium, iron, multivitamins, acid reflux medications (PPIs). -Patient is made aware of the fact that thyroid hormone replacement is needed for life, dose to be adjusted by periodic monitoring of thyroid function tests.  5)  Weight/Diet:  Her Body mass index is 29.06 kg/m.-   she is a candidate for some weight loss. I discussed with her the fact that loss of 5 - 10% of her  current body weight will have the most impact on her diabetes management.  Exercise, and detailed carbohydrates information provided  -  detailed on discharge instructions.  6) Chronic Care/Health Maintenance: -she is on ACEI/ARB and Statin medications and is encouraged to initiate and continue to  follow up with Ophthalmology, Dentist, Podiatrist at least yearly or according to recommendations, and advised to stay away from smoking. I have recommended yearly flu vaccine and pneumonia vaccine at least every 5 years; moderate intensity exercise for up to 150 minutes weekly; and sleep for at least 7 hours a day.  - she is advised to maintain close follow up with Tysinger, Kermit Balo, PA-C for primary care needs, as well as her other providers for optimal and coordinated care.     I spent  35  minutes in the care of the patient today including review of labs from CMP, Lipids, Thyroid Function, Hematology (current and previous including abstractions from other facilities); face-to-face time discussing  her blood glucose readings/logs, discussing hypoglycemia and hyperglycemia episodes and symptoms, medications doses, her options of short and long term treatment based on the latest standards of care / guidelines;  discussion about incorporating lifestyle medicine;  and documenting the encounter. Risk reduction counseling performed per USPSTF guidelines to reduce obesity and cardiovascular risk factors.     Please refer to Patient Instructions for  Blood Glucose Monitoring and Insulin/Medications Dosing Guide"  in media tab for additional information. Please  also refer to " Patient Self Inventory" in the Media  tab for reviewed elements of pertinent patient history.  Cloe Jocalyn Carolus participated in the discussions, expressed understanding, and voiced agreement with the above plans.  All questions were answered to her satisfaction. she is encouraged to contact clinic should she have any questions or concerns prior to her return visit.    Follow up plan: - Return in about 4 months (around 04/03/2024) for Diabetes F/U with A1c in office, No previsit labs, Bring meter and logs.   Ronny Bacon, [facility] [address] [place], [place] [address] Phone:  [phone] Fax: [phone]  12/05/2023, 10:56 AM

## 2023-12-06 ENCOUNTER — Encounter: Payer: Self-pay | Admitting: Nutrition

## 2023-12-06 NOTE — Patient Instructions (Signed)
Goals Increase more plant based foods Get back with silver sneakers.-try water aerobics. Get A1C down to 7% Cut out sweets, desserts and processed foods.

## 2023-12-07 ENCOUNTER — Other Ambulatory Visit: Payer: Self-pay

## 2023-12-07 ENCOUNTER — Encounter: Payer: Self-pay | Admitting: Orthopedic Surgery

## 2023-12-07 ENCOUNTER — Other Ambulatory Visit (INDEPENDENT_AMBULATORY_CARE_PROVIDER_SITE_OTHER): Payer: Self-pay

## 2023-12-07 ENCOUNTER — Ambulatory Visit: Payer: Medicare PPO | Admitting: Orthopedic Surgery

## 2023-12-07 DIAGNOSIS — M25551 Pain in right hip: Secondary | ICD-10-CM | POA: Diagnosis not present

## 2023-12-07 DIAGNOSIS — M1611 Unilateral primary osteoarthritis, right hip: Secondary | ICD-10-CM | POA: Diagnosis not present

## 2023-12-07 MED ORDER — BUPIVACAINE HCL 0.25 % IJ SOLN
4.0000 mL | INTRAMUSCULAR | Status: AC | PRN
  Administered 2023-12-07: 4 mL via INTRA_ARTICULAR

## 2023-12-07 MED ORDER — METHYLPREDNISOLONE ACETATE 80 MG/ML IJ SUSP
80.0000 mg | INTRAMUSCULAR | Status: AC | PRN
  Administered 2023-12-07: 80 mg via INTRA_ARTICULAR

## 2023-12-07 MED ORDER — LIDOCAINE HCL 1 % IJ SOLN
5.0000 mL | INTRAMUSCULAR | Status: AC | PRN
  Administered 2023-12-07: 5 mL

## 2023-12-07 NOTE — Progress Notes (Signed)
Office Visit Note   Patient: Courtney Barry           Date of Birth: 1951/01/29           MRN: [id number] Visit Date: 12/07/2023 Requested by: Jac Canavan, PA-C [address] [place],  [place] [address] PCP: Jac Canavan, PA-C  Subjective: Chief Complaint  Patient presents with   Right Hip - Pain    HPI: Joscelin Cassi Boney is a 73 y.o. female who presents to the office reporting right hip and groin pain.  Not constant.  Describes general dull ache worse with certain movements.  Thigh can be sore at times.  Harder to sleep on that right side than the left side.  Denies any history of injury and no prior surgery.  All Taryn gel and Tylenol also help.  Hard going up and down stairs.  Does not report much low back pain or radicular symptoms.  Hemoglobin A1c 7.7.  Has had symptoms for about a month..                ROS: All systems reviewed are negative as they relate to the chief complaint within the history of present illness.  Patient denies fevers or chills.  Assessment & Plan: Visit Diagnoses:  1. Arthritis of right hip   2. Pain in right hip     Plan: Impression is right groin pain with moderate arthritis on plain radiographs.  Intra-articular hip injection performed today.  Will see how she does with that.  Does not look like it is bad enough for any type of hip replacement but an episodic injection as she gets for her left knee may help this right hip.  We will see her back as needed.  Follow-Up Instructions: No follow-ups on file.   Orders:  Orders Placed This Encounter  Procedures   XR HIP UNILAT W OR W/O PELVIS 2-3 VIEWS RIGHT   US Guided Needle Placement - No Linked Charges   No orders of the defined types were placed in this encounter.     Procedures: Large Joint Inj: R hip joint on 12/07/2023 10:30 AM Indications: pain and diagnostic evaluation Details: 18 G 3.5 in needle, ultrasound-guided lateral approach  Arthrogram: No  Medications: 5 mL  lidocaine 1 %; 80 mg methylPREDNISolone acetate 80 MG/ML; 4 mL bupivacaine 0.25 % Outcome: tolerated well, no immediate complications Procedure, treatment alternatives, risks and benefits explained, specific risks discussed. Consent was given by the patient. Immediately prior to procedure a time out was called to verify the correct patient, procedure, equipment, support staff and site/side marked as required. Patient was prepped and draped in the usual sterile fashion.       Clinical Data: No additional findings.  Objective: Vital Signs: There were no vitals taken for this visit.  Physical Exam:  Constitutional: Patient appears well-developed HEENT:  Head: Normocephalic Eyes:EOM are normal Neck: Normal range of motion Cardiovascular: Normal rate Pulmonary/chest: Effort normal Neurologic: Patient is alert Skin: Skin is warm Psychiatric: Patient has normal mood and affect  Ortho Exam: Ortho exam demonstrates normal gait alignment.  Leg lengths equal.  Pedal pulses palpable.  Does have mild groin pain with internal/external rotation of that right leg but no real restriction of motion.  Hip flexion strength is symmetric bilaterally.  No discrete trochanteric tenderness.  Specialty Comments:  No specialty comments available.  Imaging: US Guided Needle Placement - No Linked Charges Result Date: 12/07/2023 Ultrasound imaging demonstrates needle placement into the right  hip joint with extravasation of fluid into the joint and no complicating features  XR HIP UNILAT W OR W/O PELVIS 2-3 VIEWS RIGHT Result Date: 12/07/2023 AP pelvis lateral radiographs right hip reviewed.  Moderate arthritis with joint space narrowing is present.  No acute fracture.  Degenerative changes also noted in the lower lumbar spine.  SI joints appear intact.    PMFS History: Patient Active Problem List   Diagnosis Date Noted   Well woman exam with routine gynecological exam 10/21/2023   Vaccine counseling  08/03/2022   Body mass index (BMI) of 29.0-29.9 in adult 03/29/2022   Primary insomnia 01/21/2022   PVD (peripheral vascular disease) (HCC) 05/21/2021   Abnormal ankle brachial index ([name]) 05/21/2021   Situational anxiety 11/21/2019   Hyperlipidemia associated with type 2 diabetes mellitus (HCC) 11/21/2019   Uncontrolled type 2 diabetes mellitus with hyperglycemia (HCC) 09/24/2019   Essential hypertension, benign 09/24/2019   Hypothyroidism 09/24/2019   Cyst of vulva 09/11/2015   Epidermoid cyst of skin 09/11/2015   Melanocytic nevus 09/11/2015   Dysplastic nevus of skin 09/11/2015   Past Medical History:  Diagnosis Date   Arthritis    generalized- RIGHT hip   Diabetes mellitus, type II (HCC)    Encounter for specialized medical examination 09/04/2013   Hyperlipidemia    on meds   Hypertension    on meds   Hypothyroidism    on meds   Menopausal vaginal dryness    Normal gynecologic examination 09/05/2014   Seasonal allergies    Vaccine counseling 05/21/2021    Family History  Problem Relation Age of Onset   Colon polyps Mother 40   Thyroid disease Mother    Cancer Mother    Pancreatic cancer Mother 79   Hypertension Father    Kidney disease Father    Thyroid cancer Sister        died at 58   Colon cancer Neg Hx    Esophageal cancer Neg Hx    Stomach cancer Neg Hx    Rectal cancer Neg Hx     Past Surgical History:  Procedure Laterality Date   ABDOMINAL HYSTERECTOMY     APPENDECTOMY     CHOLECYSTECTOMY     COLONOSCOPY  2014   Dr.Richardson-hems/tics/abnormal rectal exam-good prep   TONSILLECTOMY     Social History   Occupational History   Not on file  Tobacco Use   Smoking status: Never   Smokeless tobacco: Never  Vaping Use   Vaping status: Never Used  Substance and Sexual Activity   Alcohol use: Never   Drug use: Never   Sexual activity: Yes    Partners: Male    Comment: 1st intercourse- 38, partners- 1, married- 44 yrs,

## 2023-12-09 ENCOUNTER — Other Ambulatory Visit (INDEPENDENT_AMBULATORY_CARE_PROVIDER_SITE_OTHER): Payer: Self-pay

## 2023-12-09 DIAGNOSIS — E039 Hypothyroidism, unspecified: Secondary | ICD-10-CM

## 2023-12-09 DIAGNOSIS — M8580 Other specified disorders of bone density and structure, unspecified site: Secondary | ICD-10-CM

## 2023-12-09 LAB — THYROXINE, FREE (FREE T4): THYROXINE, FREE (FREE T4): 1.53

## 2023-12-09 LAB — THYROID STIMULATING HORMONE (SENSITIVE TSH): TSH: 1.036

## 2023-12-12 ENCOUNTER — Ambulatory Visit: Payer: Medicare PPO

## 2023-12-12 ENCOUNTER — Encounter (INDEPENDENT_AMBULATORY_CARE_PROVIDER_SITE_OTHER): Payer: Self-pay

## 2023-12-12 ENCOUNTER — Other Ambulatory Visit: Payer: Self-pay

## 2023-12-12 VITALS — BP 128/80 | HR 73 | Temp 97.3°F | Wt 138.8 lb

## 2023-12-12 DIAGNOSIS — M810 Age-related osteoporosis without current pathological fracture: Secondary | ICD-10-CM

## 2023-12-12 DIAGNOSIS — E559 Vitamin D deficiency, unspecified: Secondary | ICD-10-CM

## 2023-12-12 DIAGNOSIS — L659 Nonscarring hair loss, unspecified: Secondary | ICD-10-CM | POA: Insufficient documentation

## 2023-12-12 DIAGNOSIS — E039 Hypothyroidism, unspecified: Secondary | ICD-10-CM | POA: Insufficient documentation

## 2023-12-12 MED ORDER — LEVOTHYROXINE 75 MCG TABLET
ORAL_TABLET | ORAL | 3 refills | Status: DC
Start: 2023-12-12 — End: 2024-08-09

## 2023-12-12 NOTE — Assessment & Plan Note (Signed)
Prior level 64  - discussed goal >30ng/mL  - stop vitamin D 5000 units daily, continue bone restore supplement with total 1000 units

## 2023-12-12 NOTE — Assessment & Plan Note (Signed)
Chronic  Clinically euthyroid  On levothyroxine 6 days a week, skipping Sundays  TSH mid normal range, Free T4 slightly elevated however 2 hours post administration    - given age and symptoms goal TSH; mid- high normal   - as clinically improving; continue levothyroxine 1 tablet 6 days a week  - likely mildly elevated t4 given peak absorption within 2 hour after taking   - follow up labs in 3 months to ensure stability

## 2023-12-12 NOTE — Progress Notes (Signed)
ENDOCRINOLOGY, [facility]  [address]  [place] [address]  Operated by [facility]    Endocrinology Follow up Patient Visit      NAME: Kristin Kennedy DOS: 12/12/2023   MRN: [id number] DOB: 1951-07-27     Reason for Visit: Follow Up (F/u thyroid lab results)    LOV 09/08/2023    PCP - Aliene Beams, MD    Subjective: Kristin Kennedy is a 73 y.o. female with PMH of Hypothyroidism, Osteoporosis, vitamin D deficiency for follow up.     Overall doing really well. Denies any concerns today. Feels like since she reduced the levothyroxine, 1 tab less per week she has less anxiety/jitters, reduced hair loss.     She is now taking a bone restore vitamin; 4 capsules daily includes 1000 units vitamin D daily and total 700mg  calcium. She is still taking 5000 units vitamin D too.    She had her lab work done recently about 2 hours after taking levothyroxine. She denies use of any biotin or hair skin nail supplements.     Has dental implants which her current dentist feels these need to come out and by replaced some time soon.   She is planning on joining the [employer] in [place].   -------  Current med: Levothyroxine 1 tablet 6 days a week, taking it appropriately on an empty stomach, waiting an hour to eat or drink anything.  Not missing doses. Has been on this dose for few years.     Denies palpiataions, tremors, diarrhea.   Weights has been stable.   Still swimming 0.66mile daily.   Menstrual cycles; s/p hysterectomy     Current calcium and Vit D intake:  Dietary sources: Milk; 1 glasses a day. Cheese: 0 servings a week, Yogurt: 0 servings a week.   Life extension bone restore; 4 tablets daily   supplements: Ca: 0 mg a day  Vitamin D: 5000 IU a day  Multivitamins: 0 tabs   Fortified orange juice:    PPI Use:  no    No known family history of thyroid disease, thyroid cancer, diabetes.     Used to work as Social worker.     Denies history of tobacco use. Denies alcohol  intake.       The following portions of the patient's history were reviewed and updated as appropriate: allergies, current medications, past family history, past medical history, past social history, past surgical history and problem list.     Past Medical History:   Diagnosis Date    BCC (basal cell carcinoma), eyelid     Cervical dysplasia     Hypothyroidism     Miscarriage     Thalassemia minor          Past Surgical History:   Procedure Laterality Date    BASAL CELL CARCINOMA EXCISION      EYELID    HX BREAST BIOPSY      HX COLPOSCOPY      HX CRYOSURGERY OF CERVIX      HX HYSTERECTOMY      TVH WO BSO    OTHER SURGICAL HISTORY      FX OF FEMUR     Family Medical History:       Problem Relation (Age of Onset)    Breast Cancer Maternal Aunt, Maternal cousin    Diabetes Paternal Grandfather    High Cholesterol Father    Hypertension (High Blood Pressure) Mother  Melanoma Paternal Grandfather             Social History     Socioeconomic History    Marital status: Married   Tobacco Use    Smoking status: Never    Smokeless tobacco: Never   Vaping Use    Vaping status: Never Used   Substance and Sexual Activity    Alcohol use: Never    Drug use: Never    Sexual activity: Yes     Partners: Male     Birth control/protection: Surgical       Allergies:  No Known Allergies    Medications:  Current Outpatient Medications   Medication Instructions    cholecalciferol (Vitamin D3) 5,000 Units, Oral    ferrous sulfate (FERATAB) 324 mg, Oral    levothyroxine (SYNTHROID) 75 mcg Oral Tablet Take 1 tablet 6 days a week, skip Sundays.    omega 3-dha-epa-fish oil 1,000 mg (250 mg-750 mg)/5 mL Oral Liquid 1,000 mg, Oral       Problem List:  Patient Active Problem List    Diagnosis Date Noted    Hypothyroidism 09/08/2023    Age-related osteoporosis without current pathological fracture 09/08/2023    Vitamin D deficiency 09/08/2023    Hair loss 09/08/2023    Dense breast 02/04/2022    Family history of breast cancer 02/04/2022          Review of Systems:  All systems reviewed. All negative save for those detailed in HPI.     Objective:  BP 128/80   Pulse 73   Temp 36.3 C (97.3 F)   Wt 63 kg (138 lb 12.8 oz)   SpO2 99%   BMI 24.59 kg/m       Wt Readings from Last 3 Encounters:   12/12/23 63 kg (138 lb 12.8 oz)   09/08/23 63.6 kg (140 lb 3.2 oz)   12/22/22 62.6 kg (138 lb)      Patient examined today, changes as noted below.    GENERAL APPEARANCE: alert, cooperative, oriented, and in no acute distress.    HEENT: Head Normocephalic, Atraumatic.    EYES: conjunctivae/corneas clear. PERRL   NECK: Neck supple. No cervical adenopathy.    CHEST AND LUNGS: Clear to auscultation bilaterally  CARDIOVASCULAR: Regular rate and rhythm, no murmurs  ABDOMEN: Soft, non-tender.   EXTREMITIES: No edema in lower extremities b/l.    SKIN: normal color and texture  NEUROLOGIC: Gait normal. No tremor noted with outstretched arms.  PSYCH: Normal mood and affect    Labs:                No results found for: "HA1C"  Lab Results   Component Value Date    TSH 1.036 12/09/2023    FREET4 1.53 12/09/2023     No results found for: "WBC", "HGB", "HCT", "MCV", "MCH", "PLT"  No results found for: "NA", "K", "CO2", "BUN", "CREATININE", "CALCIUM"  No components found for: "GFRAA", "GFRNONAA"  No results found for: "ALT", "AST", "ALKPHOS", "ALBUMIN"  No results found for: "CHOLESTEROL", "HDLCHOL", "LDLCHOL", "TRIG"  No results found for: "MICALBCRERAT"  No results found for: "TSI"  No results found for: "CALCIUM"    Imaging:    Dxa 11/2023      Dxa 2021      Cytology/Pathology:      Assessment & Plan:    Vitamin D deficiency  Prior level 64  - discussed goal >30ng/mL  - stop vitamin D 5000 units daily, continue bone restore supplement  with total 1000 units        Hypothyroidism  Chronic  Clinically euthyroid  On levothyroxine 6 days a week, skipping Sundays  TSH mid normal range, Free T4 slightly elevated however 2 hours post administration    - given age and  symptoms goal TSH; mid- high normal   - as clinically improving; continue levothyroxine 1 tablet 6 days a week  - likely mildly elevated t4 given peak absorption within 2 hour after taking   - follow up labs in 3 months to ensure stability     Age-related osteoporosis without current pathological fracture  Risk factors osteoporosis including advancing age, gender, vitamin D deficiency  T score -2.6 at Left hip ; DXA 11/2023    - Discussed the diagnosis of osteoporosis and available treatments including anti-resorptive therapy and anabolic therapy. I also discussed the rare risk of ONJ and atypical femoral fractures associated with anti-resorptive therapy.    - recommend obtaining the following additional laboratories: complete metabolic panel, phosphorus, 24 hour urinary calcium, PTH, 25 hydroxyvitamin D, TSH, free T4, SPEP/UPEP.     Recommend vitamin D intake of 1000 units per day and a total of 1000 mg/day of calcium, including both dietary sources and supplements.     Advised to see dentist and see if there any procedures that need to be done prior to starting an anti-resorptive agent and inform them of plan to start anti-resorptive agent.    Encouraged weight-bearing, balance and muscle strengthening exercises.     Discussed fall precautions.    DXA due 11/2025    - would consider Reclast or fosamax given age    Hair loss  Now improving  TSH normal, testosterone normal   Iron deficiency ruled out         Follow up in 3 months     All questions answered. Patient expressed understanding and agreement.   I appreciate the opportunity to participate in this patient's care.    Dr. Malachy Chamber  Staff Endocrinologist  [facility]    [facility]  [address]   [place] [address]  P: [phone]  F: [phone]    [facility]  [address]  [place], [place] [address]    On the day of the encounter, a total of 45 minutes was spent on  this patient encounter including review of historical information, examination, documentation and post-visit activities. The time documented excludes procedural time.    Electronically signed by Malachy Chamber, MD    12/12/23

## 2023-12-12 NOTE — Assessment & Plan Note (Addendum)
Risk factors osteoporosis including advancing age, gender, vitamin D deficiency  T score -2.6 at Left hip ; DXA 11/2023    - Discussed the diagnosis of osteoporosis and available treatments including anti-resorptive therapy and anabolic therapy. I also discussed the rare risk of ONJ and atypical femoral fractures associated with anti-resorptive therapy.    - recommend obtaining the following additional laboratories: complete metabolic panel, phosphorus, 24 hour urinary calcium, PTH, 25 hydroxyvitamin D, TSH, free T4, SPEP/UPEP.     Recommend vitamin D intake of 1000 units per day and a total of 1000 mg/day of calcium, including both dietary sources and supplements.     Advised to see dentist and see if there any procedures that need to be done prior to starting an anti-resorptive agent and inform them of plan to start anti-resorptive agent.    Encouraged weight-bearing, balance and muscle strengthening exercises.     Discussed fall precautions.    DXA due 11/2025    - would consider Reclast or fosamax given age

## 2023-12-12 NOTE — Assessment & Plan Note (Addendum)
Now improving  TSH normal, testosterone normal   Iron deficiency ruled out

## 2023-12-12 NOTE — Patient Instructions (Addendum)
Continue levothyroxine 1 tablet 6 days a week.     Thyroid hormone should be taken daily on an empty stomach 30-60 minutes before breakfast and separated from supplements such as multivitamins, calcium, and iron by at least four hours.     Antacids, such as Nexium, Prilosec, Protonix, should be taken at least one hour apart from levothyroxine and caffeine should be avoided for 30-60 minutes following levothyroxine.    Aim to get at least 1000 mg calcium intake daily in diet. If not able to get through diet, can supplement with calcium carbonate or calcium citrate 500 mg daily over the counter.       Continue current vitamin D therapy.       Please get labs evaluating for secondary causes of osteoporosis.     Instructions for 24 hour urine collection:     Empty your bladder into the toilet when you get up in the morning. Do not include this urine in your timed collection. Record the time and date on your container label.    For the remainder of the 24-hour period, collect all urine you pass and immediately add it to the special collection container.   The test ends 24-hours from the first (uncollected) specimen the day before. Record the end time on the container label as "Date and Time Completed."   If you are unable to collect a urine sample at the time you are to end your test do not extend the collection time.   Unless instructed otherwise by laboratory personnel, the urine container must be kept refrigerated or in a cool place during the collection.   Return the 24-hour urine container to the laboratory as soon as possible after the collection is completed.            Can consider Reclast (once a year infusion), Fosamax (1 tablet weekly) or Prolia (subcutaneous injection every 6 months), which decreases bone break down.     - for you, I think fosamax or reclast      With long-term use and usually at higher doses, can increase risk of osteonecrosis of jaw (after an invasive dental procedure, with poor  calcium intake)   Long term use of bisphosphonate's can lead to atypical femoral fractures.       The following organizations are good sources of general information about osteoporosis:     National Osteoporosis Foundation ([place])  Phone: 6404125044  Web: [employer].ca     National Institutes of Health, Osteoporosis and Related Bone Diseases ~ [employer]: 410-073-4616  Web: www.bones.[employer].fr     2012 Surgeon General's Report on Bone Health and Osteoporosis: What It Means To You Booklet  Web: www.bones.[employer].it     Osteoporosis [place]  Phone: (907)760-5763 (English - Toll Free [place])  Web: www.osteoporosis.ca       If taking biotin or multivitamin, please hold 4 days prior to lab work.

## 2023-12-21 NOTE — Progress Notes (Signed)
 OB/GYN, [facility] [address]  [place] [address]  Operated by [facility]  Progress Note    Name: Kristin Kennedy MRN:  [id number]   Date: 12/27/2023 Age: 73 y.o.        ANNUAL VISIT    PATIENT: Kristin Kennedy  CHART NUMBER: [id number]  DATE OF SERVICE: 12/27/2023    CC: "Annual exam"  Chief Complaint   Patient presents with    Annual Exam     Patient is a 73 yo who presents today for a yearly check-up. She states that had a Dexa Scan and it showed osteoporosis. She would like to discuss treatment. The results are scanned in [employer].        HPI: Kristin Kennedy is a 73 y.o. year old No obstetric history on file..  She presents to clinic for her annual exam. She is doing well.  No LMP recorded. Patient has had a hysterectomy.          REVIEW OF SYSTEM  Pertinent review of system as per HPI.    OB History:  OB History   Gravida Para Term Preterm AB Living   1 0 0  1    SAB IAB Ectopic Multiple Live Births   1          # Outcome Date GA Lbr Len/2nd Weight Sex Type Anes PTL Lv   1 SAB                PAST MEDICAL HISTORY:  Past Medical History:   Diagnosis Date    BCC (basal cell carcinoma), eyelid     Cervical dysplasia     Hypothyroidism     Miscarriage     Osteoporosis     Thalassemia minor            PAST SURGICAL HISTORY:  Past Surgical History:   Procedure Laterality Date    BASAL CELL CARCINOMA EXCISION      EYELID    HX BREAST BIOPSY      HX COLPOSCOPY      HX CRYOSURGERY OF CERVIX      HX HYSTERECTOMY      TVH WO BSO    OTHER SURGICAL HISTORY      FX OF FEMUR           FAMILY HISTORY:  Family Medical History:       Problem Relation (Age of Onset)    Breast Cancer Maternal Aunt, Maternal cousin    Diabetes Paternal Grandfather    High Cholesterol Father    Hypertension (High Blood Pressure) Mother    Melanoma Paternal Grandfather              SOCIAL HISTORY:  Social History     Socioeconomic History    Marital status: Married   Tobacco Use    Smoking status: Never     Smokeless tobacco: Never   Vaping Use    Vaping status: Never Used   Substance and Sexual Activity    Alcohol use: Never    Drug use: Never    Sexual activity: Yes     Partners: Male     Birth control/protection: Surgical        CURRENT MEDICATIONS:   Current Outpatient Medications   Medication Sig    cholecalciferol, Vitamin D3, 125 mcg (5,000 unit) Oral Tablet Take 1 Tablet (5,000 Units total) by mouth    ferrous sulfate (FERATAB) 324 mg (65 mg iron)  Oral Tablet, Delayed Release (E.C.) Take 1 Tablet (324 mg total) by mouth    levothyroxine (SYNTHROID) 75 mcg Oral Tablet Take 1 tablet 6 days a week, skip Sundays. Indications: a condition with low thyroid hormone levels    omega 3-dha-epa-fish oil 1,000 mg (250 mg-750 mg)/5 mL Oral Liquid Take 1,000 mg by mouth    UNKNOWN MEDICATION (UNKNOWN MEDICATION) BONE RESTORE WITH CALCIUM. VIT D AND K       ALLERGIES:  Patient has no known allergies.     PHYSICAL EXAMINATION:   Vitals:    12/27/23 0922   BP: 110/68   Weight: 63.5 kg (140 lb)   Height: 1.6 m (5\' 3" )   BMI: 24.8             Body mass index is 24.8 kg/m.   Physical Exam  Constitutional:       General: She is awake.      Appearance: Normal appearance.   Genitourinary:      Bladder and urethral meatus normal.      No lesions in the vagina.      Right Labia: No rash, tenderness, lesions, skin changes or Bartholin's cyst.     Left Labia: No tenderness, lesions, skin changes, Bartholin's cyst or rash.     Vaginal cuff intact.     No vaginal discharge, erythema, tenderness, bleeding, ulceration or granulation tissue.      No vaginal prolapse present.     No vaginal atrophy present.       Right Adnexa: not tender, not full and no mass present.     Left Adnexa: not tender, not full and no mass present.     Cervix is absent.      No cervical discharge, friability, lesion or polyp.      Uterus is not enlarged or tender.      Uterus is absent.   Breasts:     Breasts are symmetrical.      Breasts are soft.     Right: No  swelling, mass, nipple discharge, skin change or tenderness.      Left: No swelling, mass, nipple discharge, skin change or tenderness.   Neck:      Thyroid: No thyroid mass, thyromegaly or thyroid tenderness.   Cardiovascular:      Rate and Rhythm: Normal rate and regular rhythm.      Heart sounds: Normal heart sounds.   Pulmonary:      Effort: Pulmonary effort is normal. No tachypnea, bradypnea, accessory muscle usage or prolonged expiration.      Breath sounds: Normal breath sounds and air entry.   Abdominal:      General: There is no distension.      Palpations: Abdomen is soft. There is no hepatomegaly or mass.      Tenderness: There is no abdominal tenderness.      Hernia: No hernia is present.   Musculoskeletal:      Cervical back: Normal range of motion.   Lymphadenopathy:      Cervical:      Right cervical: No superficial, deep or posterior cervical adenopathy.     Left cervical: No superficial, deep or posterior cervical adenopathy.   Neurological:      General: No focal deficit present.      Mental Status: She is alert and oriented to person, place, and time.   Skin:     General: Skin is warm.      Findings: Rash is not crusting, nodular  or purpuric.   Psychiatric:         Attention and Perception: Attention normal.         Mood and Affect: Mood normal.         Behavior: Behavior normal. Behavior is cooperative.   Vitals and nursing note reviewed.          ASSESSMENT:  (Z01.419) Encounter for well woman exam with routine gynecological exam  (primary encounter diagnosis)    (Z12.31) Encounter for screening mammogram for malignant neoplasm of breast    (M81.0) Osteoporosis, unspecified osteoporosis type, unspecified pathological fracture presence       PLAN:  Full annual check up completed.    Denies bone fractures within the past 6 months.  Endocrinology following DXA scan.  DXA scan 2021 and 1/25 showed osteoporosis. Following with Malachy Chamber and has urine and labs ordered for further evaluation.   Also now working out once weekly at [employer] with [employer].   Following with the breast center for dense breasts.  MRI and mammograms now Q 6 months.    Had colonoscopy 2020.  Due again 2030.    Normal pap 2015 and 2017 and 2019.  Cotesting done per patient request and negative 2022.  No longer needs paps.  S/P hysterectomy for benign reasons.    No questions or concerns.  RTO in 1 year or prn.      No orders of the defined types were placed in this encounter.         Landis Martins, PA-C

## 2023-12-27 ENCOUNTER — Other Ambulatory Visit: Payer: Self-pay

## 2023-12-27 ENCOUNTER — Ambulatory Visit: Payer: 59 | Attending: Medical | Admitting: Medical

## 2023-12-27 ENCOUNTER — Encounter (INDEPENDENT_AMBULATORY_CARE_PROVIDER_SITE_OTHER): Payer: Self-pay | Admitting: Medical

## 2023-12-27 VITALS — BP 110/68 | Ht 63.0 in | Wt 140.0 lb

## 2023-12-27 DIAGNOSIS — Z01411 Encounter for gynecological examination (general) (routine) with abnormal findings: Secondary | ICD-10-CM

## 2023-12-27 DIAGNOSIS — Z1231 Encounter for screening mammogram for malignant neoplasm of breast: Secondary | ICD-10-CM | POA: Insufficient documentation

## 2023-12-27 DIAGNOSIS — M810 Age-related osteoporosis without current pathological fracture: Secondary | ICD-10-CM | POA: Insufficient documentation

## 2023-12-27 DIAGNOSIS — Z01419 Encounter for gynecological examination (general) (routine) without abnormal findings: Secondary | ICD-10-CM | POA: Insufficient documentation

## 2024-01-02 ENCOUNTER — Other Ambulatory Visit: Payer: Self-pay | Admitting: Medical

## 2024-01-02 DIAGNOSIS — Z1231 Encounter for screening mammogram for malignant neoplasm of breast: Secondary | ICD-10-CM

## 2024-01-15 ENCOUNTER — Other Ambulatory Visit: Payer: Self-pay | Admitting: Nurse Practitioner

## 2024-01-18 ENCOUNTER — Ambulatory Visit: Admitting: Medical

## 2024-01-18 ENCOUNTER — Encounter: Payer: Self-pay | Admitting: Medical

## 2024-01-18 VITALS — BP 122/68 | HR 98 | Ht 64.0 in | Wt 166.6 lb

## 2024-01-18 DIAGNOSIS — Z7185 Encounter for immunization safety counseling: Secondary | ICD-10-CM

## 2024-01-18 DIAGNOSIS — E1169 Type 2 diabetes mellitus with other specified complication: Secondary | ICD-10-CM

## 2024-01-18 DIAGNOSIS — E039 Hypothyroidism, unspecified: Secondary | ICD-10-CM | POA: Diagnosis not present

## 2024-01-18 DIAGNOSIS — E1165 Type 2 diabetes mellitus with hyperglycemia: Secondary | ICD-10-CM

## 2024-01-18 DIAGNOSIS — Z136 Encounter for screening for cardiovascular disorders: Secondary | ICD-10-CM | POA: Diagnosis not present

## 2024-01-18 DIAGNOSIS — Z1231 Encounter for screening mammogram for malignant neoplasm of breast: Secondary | ICD-10-CM | POA: Diagnosis not present

## 2024-01-18 DIAGNOSIS — Z13220 Encounter for screening for lipoid disorders: Secondary | ICD-10-CM

## 2024-01-18 DIAGNOSIS — E785 Hyperlipidemia, unspecified: Secondary | ICD-10-CM

## 2024-01-18 DIAGNOSIS — Z1389 Encounter for screening for other disorder: Secondary | ICD-10-CM

## 2024-01-18 DIAGNOSIS — I10 Essential (primary) hypertension: Secondary | ICD-10-CM | POA: Diagnosis not present

## 2024-01-18 DIAGNOSIS — Z0000 Encounter for general adult medical examination without abnormal findings: Secondary | ICD-10-CM | POA: Diagnosis not present

## 2024-01-18 DIAGNOSIS — Z780 Asymptomatic menopausal state: Secondary | ICD-10-CM

## 2024-01-18 DIAGNOSIS — I739 Peripheral vascular disease, unspecified: Secondary | ICD-10-CM | POA: Diagnosis not present

## 2024-01-18 LAB — POCT URINALYSIS DIP (PROADVANTAGE DEVICE)
Bilirubin, UA: NEGATIVE
Blood, UA: NEGATIVE
Glucose, UA: NEGATIVE mg/dL
Ketones, POC UA: NEGATIVE mg/dL
Leukocytes, UA: NEGATIVE
Nitrite, UA: NEGATIVE
Protein Ur, POC: NEGATIVE mg/dL
Specific Gravity, Urine: 1.02
Urobilinogen, Ur: 0.2
pH, UA: 6 (ref 5.0–8.0)

## 2024-01-18 LAB — LIPID PANEL

## 2024-01-18 MED ORDER — ERYTHROMYCIN 5 MG/GM OP OINT: 1.0000 | TOPICAL_OINTMENT | Freq: Every day | OPHTHALMIC | 0 refills | Status: DC

## 2024-01-18 NOTE — Progress Notes (Signed)
 Subjective:   HPI  Courtney Barry is a 73 y.o. female who presents for Chief Complaint  Patient presents with   Annual Exam    CPE needs to schedule MWV with [place], fasting labs, possible stye on lt. Eye hurts and itches, had a rash on the back of neck, sour burps at night,     Patient Care Team: Oryn Casanova, Kermit Balo, PA-C as PCP - General (Family Medicine) Dani Gobble, NP as Nurse Practitioner (Nurse Practitioner) Genia Del, MD as Consulting Physician (Gynecologic Oncology) Lynann Bologna, MD as Consulting Physician (Gastroenterology) [facility], [facility] August Saucer, Corrie Mckusick, MD as Consulting Physician (Orthopedic Surgery)    Concerns: No concerns about memory  Sees endocrinology for diabetes and thyroid  Compliant with BP medication, cholesterol medication, vit D supplement   Reviewed their medical, surgical, family, social, medication, and allergy history and updated chart as appropriate.  Allergies  Allergen Reactions   Latex     Gloves, band aids- causes rash   Sulfa Antibiotics Rash    Past Medical History:  Diagnosis Date   Arthritis    generalized- RIGHT hip   Diabetes mellitus, type II (HCC)    Encounter for specialized medical examination 09/04/2013   Hyperlipidemia    on meds   Hypertension    on meds   Hypothyroidism    on meds   Menopausal vaginal dryness    Normal gynecologic examination 09/05/2014   Seasonal allergies    Vaccine counseling 05/21/2021    Current Outpatient Medications on File Prior to Visit  Medication Sig Dispense Refill   Accu-Chek Softclix Lancets lancets Use as instructed to monitor glucose twice daily 100 each 12   atorvastatin (LIPITOR) 40 MG tablet TAKE 1 TABLET BY MOUTH EVERY DAY 90 tablet 1   Blood Glucose Monitoring Suppl (ACCU-CHEK GUIDE ME) w/Device KIT Use as directed to check blood glucose twice daily before breakfast and bed. DX E11.65 1 kit 0    Cholecalciferol (VITAMIN D) 125 MCG (5000 UT) CAPS Take 1 capsule by mouth daily.     glucose blood (ACCU-CHEK GUIDE) test strip USE TO CHECK BLOOD GLUCOSE TWICE DAILY 200 strip 2   Insulin Pen Needle (BD PEN NEEDLE NANO U/F) 32G X 4 MM MISC 1 Units by Does not apply route daily at 2 PM. 100 each 3   loratadine (CLARITIN) 10 MG tablet Take 10 mg by mouth daily as needed for allergies, rhinitis or itching.     losartan (COZAAR) 25 MG tablet TAKE 1 TABLET (25 MG TOTAL) BY MOUTH DAILY. 90 tablet 1   Multiple Vitamin (MULTIVITAMIN) tablet Take 1 tablet by mouth daily.     Semaglutide, 2 MG/DOSE, (OZEMPIC, 2 MG/DOSE,) 8 MG/3ML SOPN INJECT 2 MG AS DIRECTED ONCE A WEEK. 9 mL 0   SYNTHROID 112 MCG tablet Take 1 tablet (112 mcg total) by mouth daily. 90 tablet 1   TRESIBA FLEXTOUCH 100 UNIT/ML FlexTouch Pen Inject 25 Units into the skin at bedtime. 15 mL 3   No current facility-administered medications on file prior to visit.      Current Outpatient Medications:    Accu-Chek Softclix Lancets lancets, Use as instructed to monitor glucose twice daily, Disp: 100 each, Rfl: 12   atorvastatin (LIPITOR) 40 MG tablet, TAKE 1 TABLET BY MOUTH EVERY DAY, Disp: 90 tablet, Rfl: 1   Blood Glucose Monitoring Suppl (ACCU-CHEK GUIDE ME) w/Device KIT, Use as directed to check blood glucose twice daily before breakfast and  bed. DX E11.65, Disp: 1 kit, Rfl: 0   Cholecalciferol (VITAMIN D) 125 MCG (5000 UT) CAPS, Take 1 capsule by mouth daily., Disp: , Rfl:    erythromycin ophthalmic ointment, Place 1 Application into the left eye at bedtime., Disp: 3.5 g, Rfl: 0   glucose blood (ACCU-CHEK GUIDE) test strip, USE TO CHECK BLOOD GLUCOSE TWICE DAILY, Disp: 200 strip, Rfl: 2   Insulin Pen Needle (BD PEN NEEDLE NANO U/F) 32G X 4 MM MISC, 1 Units by Does not apply route daily at 2 PM., Disp: 100 each, Rfl: 3   loratadine (CLARITIN) 10 MG tablet, Take 10 mg by mouth daily as needed for allergies, rhinitis or itching., Disp: ,  Rfl:    losartan (COZAAR) 25 MG tablet, TAKE 1 TABLET (25 MG TOTAL) BY MOUTH DAILY., Disp: 90 tablet, Rfl: 1   Multiple Vitamin (MULTIVITAMIN) tablet, Take 1 tablet by mouth daily., Disp: , Rfl:    Semaglutide, 2 MG/DOSE, (OZEMPIC, 2 MG/DOSE,) 8 MG/3ML SOPN, INJECT 2 MG AS DIRECTED ONCE A WEEK., Disp: 9 mL, Rfl: 0   SYNTHROID 112 MCG tablet, Take 1 tablet (112 mcg total) by mouth daily., Disp: 90 tablet, Rfl: 1   TRESIBA FLEXTOUCH 100 UNIT/ML FlexTouch Pen, Inject 25 Units into the skin at bedtime., Disp: 15 mL, Rfl: 3  Family History  Problem Relation Age of Onset   Colon polyps Mother 3   Thyroid disease Mother    Cancer Mother    Pancreatic cancer Mother 31   Hypertension Father    Kidney disease Father    Thyroid cancer Sister        died at 25   Colon cancer Neg Hx    Esophageal cancer Neg Hx    Stomach cancer Neg Hx    Rectal cancer Neg Hx     Past Surgical History:  Procedure Laterality Date   ABDOMINAL HYSTERECTOMY     APPENDECTOMY     CHOLECYSTECTOMY     COLONOSCOPY  2014   Dr.Richardson-hems/tics/abnormal rectal exam-good prep   COLONOSCOPY  06/2023   Dr. Lynann Bologna, diverticulosis and polpys, repeat 7 years   TONSILLECTOMY      Review of Systems  Constitutional:  Negative for chills, fever, malaise/fatigue and weight loss.  HENT:  Negative for congestion, ear pain, hearing loss, sore throat and tinnitus.   Eyes:  Positive for redness. Negative for blurred vision and pain.  Respiratory:  Negative for cough, hemoptysis and shortness of breath.   Cardiovascular:  Negative for chest pain, palpitations, orthopnea, claudication and leg swelling.  Gastrointestinal:  Negative for abdominal pain, blood in stool, constipation, diarrhea, nausea and vomiting.  Genitourinary:  Negative for dysuria, flank pain, frequency, hematuria and urgency.  Musculoskeletal:  Negative for falls, joint pain and myalgias.  Skin:  Negative for itching and rash.  Neurological:  Negative  for dizziness, tingling, speech change, weakness and headaches.  Endo/Heme/Allergies:  Negative for polydipsia. Does not bruise/bleed easily.  Psychiatric/Behavioral:  Negative for depression and memory loss. The patient is not nervous/anxious and does not have insomnia.        Objective:  BP 122/68   Pulse 98   Ht 5\' 4"  (1.626 m)   Wt 166 lb 9.6 oz (75.6 kg)   BMI 28.60 kg/m   Wt Readings from Last 3 Encounters:  01/18/24 166 lb 9.6 oz (75.6 kg)  12/05/23 168 lb (76.2 kg)  12/05/23 168 lb (76.2 kg)   BP Readings from Last 3 Encounters:  01/18/24 122/68  12/05/23 119/73  10/21/23 118/60   General appearance: alert, no distress, WD/WN, African American female Skin: right mid abdomen with 2 small 2mm diameter follicular lesions, erythema, Numerous scattered skin tags of the anterior neck, no other worrisome lesions HEENT: normocephalic, conjunctiva/corneas normal, sclerae anicteric, left upper eye lid with mild swelling and mild erythema, suggestive of gland swollen precursor to stye, otherwise PERRLA, EOMi, nares patent, no discharge or erythema, pharynx normal Oral cavity: MMM, tongue normal, teeth in good repair Neck: supple, no lymphadenopathy, no thyromegaly, no masses, normal ROM, no bruits Chest: non tender, normal shape and expansion Heart: RRR, normal S1, S2, no murmurs Lungs: CTA bilaterally, no wheezes, rhonchi, or rales Abdomen: +bs, soft, non tender, non distended, no masses, no hepatomegaly, no splenomegaly, no bruits Back: non tender, normal ROM, no scoliosis Musculoskeletal: upper extremities non tender, no obvious deformity, normal ROM throughout, lower extremities non tender, no obvious deformity, normal ROM throughout Extremities: no edema, no cyanosis, no clubbing Pulses: 2+ symmetric, upper and lower extremities, normal cap refill Neurological: alert, oriented x 3, CN2-12 intact, strength normal upper extremities and lower extremities, sensation normal  throughout, DTRs 2+ throughout, no cerebellar signs, gait normal Psychiatric: normal affect, behavior normal, pleasant  Breast/gyn/rectal - deferred to gynecology   Diabetic Foot Exam - Simple   Simple Foot Form Diabetic Foot exam was performed with the following findings: Yes 01/18/2024  9:55 AM  Visual Inspection No deformities, no ulcerations, no other skin breakdown bilaterally: Yes Sensation Testing Intact to touch and monofilament testing bilaterally: Yes Pulse Check See comments: Yes Comments 1+ pedal pulses      Assessment and Plan :   Encounter Diagnoses  Name Primary?   Encounter for health maintenance examination in adult Yes   Postmenopausal estrogen deficiency    Screening for hematuria or proteinuria    Encounter for lipid screening for cardiovascular disease    Screening mammogram for breast cancer    Uncontrolled type 2 diabetes mellitus with hyperglycemia (HCC)    PVD (peripheral vascular disease) (HCC)    Hypothyroidism, unspecified type    Hyperlipidemia associated with type 2 diabetes mellitus (HCC)    Essential hypertension, benign    Vaccine counseling       This visit was a preventative care visit, also known as wellness visit or routine physical.   Topics typically include healthy lifestyle, diet, exercise, preventative care, vaccinations, sick and well care, proper use of emergency dept and after hours care, as well as other concerns.    Separate significant issues discussed: Diabetes and hypothyroidism -managed by endocrinology  Hypertension-continue current medication losartan 25 mg daily  Hyperlipidemia-continue Lipitor 40 mg daily  Folliculitis -bumps on right abdomen.  Use some Neosporin over the next week.  If this does not clear up or gets worse then recheck  Blephararitis -use warm compresses as you are doing several more days but if worse over the next few days then you can begin E-Mycin ointment at night x 5 to 7 days.  Practice good  handwashing has been    General Recommendations: Continue to return yearly for your annual wellness and preventative care visits.  This gives [place] a chance to discuss healthy lifestyle, exercise, vaccinations, review your chart record, and perform screenings where appropriate.  I recommend you see your eye doctor yearly for routine vision care.  I recommend you see your dentist yearly for routine dental care including hygiene visits twice yearly.   Vaccination recommendations were reviewed Immunization History  Administered Date(s)  Administered   Fluad Quad(high Dose 65+) 09/10/2020, 08/14/2021, 08/03/2022   Fluad Trivalent(High Dose 65+) 08/29/2023   Influenza-Unspecified 08/09/2019   PFIZER Comirnaty(Gray Top)Covid-19 Tri-Sucrose Vaccine 05/28/2021   PFIZER(Purple Top)SARS-COV-2 Vaccination 12/31/2019, 01/02/2020, 08/25/2020   Pneumococcal Conjugate-13 11/24/2020   Pneumococcal Polysaccharide-23 01/19/2022    Vaccine recommendations: Tdap tetanus Shingrix Covid booster RSV vaccine  Get these at your pharmacy   Screening for cancer: Colon cancer screening: I reviewed your August 2024 colonoscopy with Dr. Lynann Bologna, there was moderate sigmoid diverticulosis and multiple polyps in the colon.  You are due repeat in 7 years, 2031  Breast cancer screening: You should perform a self breast exam monthly.   Get your mammogram in April as planned.  We typically do mammograms up to age 37 and you should still do a self breast exam monthly.  It is recommended to have a yearly clinical breast exam  S/P hysterectomy  Skin cancer screening: Check your skin regularly for new changes, growing lesions, or other lesions of concern Come in for evaluation if you have skin lesions of concern.  Lung cancer screening: If you have a greater than 20 pack year history of tobacco use, then you may qualify for lung cancer screening with a chest CT scan.   Please call your insurance company to  inquire about coverage for this test.  Pancreatic cancer: no current screening test is available routinely recommended.  (Risk factors: Smoking, overweight or obese, diabetes, chronic pancreatitis, work Nurse, mental health, Solicitor, 38 year old or greater, female greater than female, African-American, family history of pancreatic cancer, hereditary breast, ovarian, melanoma, Lynch, Peutz-jeghers).  We currently don't have screenings for other cancers besides breast, cervical, colon, and lung cancers.  If you have a strong family history of cancer or have other cancer screening concerns, please let me know.    Bone health: Get at least 150 minutes of aerobic exercise weekly Get weight bearing exercise at least once weekly Bone density test:  A bone density test is an imaging test that uses a type of X-ray to measure the amount of calcium and other minerals in your bones. The test may be used to diagnose or screen you for a condition that causes weak or thin bones (osteoporosis), predict your risk for a broken bone (fracture), or determine how well your osteoporosis treatment is working. The bone density test is recommended for females 65 and older, or females or males <65 if certain risk factors such as thyroid disease, long term use of steroids such as for asthma or rheumatological issues, vitamin D deficiency, estrogen deficiency, family history of osteoporosis, self or family history of fragility fracture in first degree relative.  You are due for bone density repeat scan.  Please call to schedule  Please call to schedule your bone density test with either breast center or you can repeat this at the gynecology office you had done last time.   [facility]  [phone] [address], [address] [place], [place] [address]   Heart health: Get at least 150 minutes of aerobic exercise weekly Limit alcohol It is important to maintain a healthy blood pressure  and healthy cholesterol numbers  Heart disease screening: Screening for heart disease includes screening for blood pressure, fasting lipids, glucose/diabetes screening, BMI height to weight ratio, reviewed of smoking status, physical activity, and diet.    Goals include blood pressure 120/80 or less, maintaining a healthy lipid/cholesterol profile, preventing diabetes or keeping diabetes numbers under good control, not smoking  or using tobacco products, exercising most days per week or at least 150 minutes per week of exercise, and eating healthy variety of fruits and vegetables, healthy oils, and avoiding unhealthy food choices like fried food, fast food, high sugar and high cholesterol foods.    Other tests may possibly include EKG test, CT coronary calcium score, echocardiogram, exercise treadmill stress test.   Consider CT coronary heart screening test   Vascular disease screening: For high risk individuals including smokers, diabetes, patients with known heart disease or high blood pressure, kidney disease, and others, screening for vascular disease or atherosclerosis of the arteries is available.  Examples may include carotid ultrasound, abdominal aortic ultrasound, [name] blood flow screening in the legs, thoracic aorta screening.  [name] normal 06/2021   Medical care options: I recommend you continue to seek care here first for routine care.  We try really hard to have available appointments Monday through Friday daytime hours for sick visits, acute visits, and physicals.  Urgent care should be used for after hours and weekends for significant issues that cannot wait till the next day.  The emergency department should be used for significant potentially life-threatening emergencies.  The emergency department is expensive, can often have long wait times for less significant concerns, so try to utilize primary care, urgent care, or telemedicine when possible to avoid unnecessary trips to the  emergency department.  Virtual visits and telemedicine have been introduced since the pandemic started in 2020, and can be convenient ways to receive medical care.  We offer virtual appointments as well to assist you in a variety of options to seek medical care.   Legal Take the time to do a Last Will and Testament, advanced directives including Healthcare Power of Attorney and Living Will documents.  Do not leave your family with burdens that can be handled ahead of time.   Advanced Directives: I recommend you consider completing a Health Care Power of Attorney and Living Will.   These documents respect your wishes and help alleviate burdens on your loved ones if you were to become terminally ill or be in a position to need those documents enforced.    You can complete Advanced Directives yourself, have them notarized, then have copies made for our office, for you and for anybody you feel should have them in safe keeping.  Or, you can have an attorney prepare these documents.   If you haven't updated your Last Will and Testament in a while, it may be worthwhile having an attorney prepare these documents together and save on some costs.       Spiritual and Emotional Health Keeping a healthy spiritual life can help you better manage your physical health. Your spiritual life can help you to cope with any issues that may arise with your physical health.  Balance can keep [place] healthy and help [place] to recover.  If you are struggling with your spiritual health there are questions that you may want to ask yourself:  What makes me feel most complete? When do I feel most connected to the rest of the world? Where do I find the most inner strength? What am I doing when I feel whole?  Helpful tips: Being in nature. Some people feel very connected and at peace when they are walking outdoors or are outside. Helping others. Some feel the largest sense of wellbeing when they are of service to others. Being  of service can take on many forms. It can be doing volunteer work,  being kind to strangers, or offering a hand to a friend in need. Gratitude. Some people find they feel the most connected when they remain grateful. They may make lists of all the things they are grateful for or say a thank you out loud for all they have.    Emotional Health Are you in tune with your emotional health?  Check out this link: http://www.marquez-love.com/   Financial Health Make sure you use a budget for your personal finances Make sure you are insured against risks (health insurance, life insurance, auto insurance, etc) Save more, spend less Set financial goals If you need help in this area, good resources include counseling through [employer] or other community resources, have a meeting with a Social research officer, government, and a good resource is Scientist, physiological    Ethell was seen today for annual exam.  Diagnoses and all orders for this visit:  Encounter for health maintenance examination in adult -     CBC -     Lipid panel -     Microalbumin/Creatinine Ratio, Urine -     POCT Urinalysis DIP (Proadvantage Device)  Postmenopausal estrogen deficiency  Screening for hematuria or proteinuria  Encounter for lipid screening for cardiovascular disease -     Lipid panel  Screening mammogram for breast cancer  Uncontrolled type 2 diabetes mellitus with hyperglycemia (HCC) -     Microalbumin/Creatinine Ratio, Urine -     POCT Urinalysis DIP (Proadvantage Device)  PVD (peripheral vascular disease) (HCC)  Hypothyroidism, unspecified type  Hyperlipidemia associated with type 2 diabetes mellitus (HCC)  Essential hypertension, benign -     POCT Urinalysis DIP (Proadvantage Device)  Vaccine counseling  Other orders -     erythromycin ophthalmic ointment; Place 1 Application into the left eye at bedtime.    Spent > 45 minutes face to face with patient in discussion of symptoms,  evaluation, plan and recommendations.     Follow-up pending labs, yearly for physical

## 2024-01-18 NOTE — Patient Instructions (Signed)
 This visit was a preventative care visit, also known as wellness visit or routine physical.   Topics typically include healthy lifestyle, diet, exercise, preventative care, vaccinations, sick and well care, proper use of emergency dept and after hours care, as well as other concerns.    Separate significant issues discussed: Diabetes and hypothyroidism -managed by endocrinology  Hypertension-continue current medication losartan 25 mg daily  Hyperlipidemia-continue Lipitor 40 mg daily  Folliculitis -bumps on right abdomen.  Use some Neosporin over the next week.  If this does not clear up or gets worse then recheck  Blephararitis -use warm compresses as you are doing several more days but if worse over the next few days then you can begin E-Mycin ointment at night x 5 to 7 days.  Practice good handwashing has been    General Recommendations: Continue to return yearly for your annual wellness and preventative care visits.  This gives [place] a chance to discuss healthy lifestyle, exercise, vaccinations, review your chart record, and perform screenings where appropriate.  I recommend you see your eye doctor yearly for routine vision care.  I recommend you see your dentist yearly for routine dental care including hygiene visits twice yearly.   Vaccination recommendations were reviewed Immunization History  Administered Date(s) Administered   Fluad Quad(high Dose 65+) 09/10/2020, 08/14/2021, 08/03/2022   Fluad Trivalent(High Dose 65+) 08/29/2023   Influenza-Unspecified 08/09/2019   PFIZER Comirnaty(Gray Top)Covid-19 Tri-Sucrose Vaccine 05/28/2021   PFIZER(Purple Top)SARS-COV-2 Vaccination 12/31/2019, 01/02/2020, 08/25/2020   Pneumococcal Conjugate-13 11/24/2020   Pneumococcal Polysaccharide-23 01/19/2022    Vaccine recommendations: Tdap tetanus Shingrix Covid booster RSV vaccine  Get these at your pharmacy   Screening for cancer: Colon cancer screening: I reviewed your August  2024 colonoscopy with Dr. Lynann Bologna, there was moderate sigmoid diverticulosis and multiple polyps in the colon.  You are due repeat in 7 years, 2031  Breast cancer screening: You should perform a self breast exam monthly.   Get your mammogram in April as planned.  We typically do mammograms up to age 63 and you should still do a self breast exam monthly.  It is recommended to have a yearly clinical breast exam  S/P hysterectomy  Skin cancer screening: Check your skin regularly for new changes, growing lesions, or other lesions of concern Come in for evaluation if you have skin lesions of concern.  Lung cancer screening: If you have a greater than 20 pack year history of tobacco use, then you may qualify for lung cancer screening with a chest CT scan.   Please call your insurance company to inquire about coverage for this test.  Pancreatic cancer: no current screening test is available routinely recommended.  (Risk factors: Smoking, overweight or obese, diabetes, chronic pancreatitis, work Nurse, mental health, Solicitor, 70 year old or greater, female greater than female, African-American, family history of pancreatic cancer, hereditary breast, ovarian, melanoma, Lynch, Peutz-jeghers).  We currently don't have screenings for other cancers besides breast, cervical, colon, and lung cancers.  If you have a strong family history of cancer or have other cancer screening concerns, please let me know.    Bone health: Get at least 150 minutes of aerobic exercise weekly Get weight bearing exercise at least once weekly Bone density test:  A bone density test is an imaging test that uses a type of X-ray to measure the amount of calcium and other minerals in your bones. The test may be used to diagnose or screen you for a condition that causes weak or thin bones (osteoporosis), predict  your risk for a broken bone (fracture), or determine how well your osteoporosis treatment is working. The bone  density test is recommended for females 65 and older, or females or males <65 if certain risk factors such as thyroid disease, long term use of steroids such as for asthma or rheumatological issues, vitamin D deficiency, estrogen deficiency, family history of osteoporosis, self or family history of fragility fracture in first degree relative.  You are due for bone density repeat scan.  Please call to schedule  Please call to schedule your bone density test with either breast center or you can repeat this at the gynecology office you had done last time.   [facility]  [phone] [address] [place], [place] [address]   Heart health: Get at least 150 minutes of aerobic exercise weekly Limit alcohol It is important to maintain a healthy blood pressure and healthy cholesterol numbers  Heart disease screening: Screening for heart disease includes screening for blood pressure, fasting lipids, glucose/diabetes screening, BMI height to weight ratio, reviewed of smoking status, physical activity, and diet.    Goals include blood pressure 120/80 or less, maintaining a healthy lipid/cholesterol profile, preventing diabetes or keeping diabetes numbers under good control, not smoking or using tobacco products, exercising most days per week or at least 150 minutes per week of exercise, and eating healthy variety of fruits and vegetables, healthy oils, and avoiding unhealthy food choices like fried food, fast food, high sugar and high cholesterol foods.    Other tests may possibly include EKG test, CT coronary calcium score, echocardiogram, exercise treadmill stress test.   Consider CT coronary heart screening test   Vascular disease screening: For high risk individuals including smokers, diabetes, patients with known heart disease or high blood pressure, kidney disease, and others, screening for vascular disease or atherosclerosis of the arteries is  available.  Examples may include carotid ultrasound, abdominal aortic ultrasound, [name] blood flow screening in the legs, thoracic aorta screening.  [name] normal 06/2021   Medical care options: I recommend you continue to seek care here first for routine care.  We try really hard to have available appointments Monday through Friday daytime hours for sick visits, acute visits, and physicals.  Urgent care should be used for after hours and weekends for significant issues that cannot wait till the next day.  The emergency department should be used for significant potentially life-threatening emergencies.  The emergency department is expensive, can often have long wait times for less significant concerns, so try to utilize primary care, urgent care, or telemedicine when possible to avoid unnecessary trips to the emergency department.  Virtual visits and telemedicine have been introduced since the pandemic started in 2020, and can be convenient ways to receive medical care.  We offer virtual appointments as well to assist you in a variety of options to seek medical care.   Legal Take the time to do a Last Will and Testament, advanced directives including Healthcare Power of Attorney and Living Will documents.  Do not leave your family with burdens that can be handled ahead of time.   Advanced Directives: I recommend you consider completing a Health Care Power of Attorney and Living Will.   These documents respect your wishes and help alleviate burdens on your loved ones if you were to become terminally ill or be in a position to need those documents enforced.    You can complete Advanced Directives yourself, have them notarized, then have  copies made for our office, for you and for anybody you feel should have them in safe keeping.  Or, you can have an attorney prepare these documents.   If you haven't updated your Last Will and Testament in a while, it may be worthwhile having an attorney prepare these  documents together and save on some costs.       Spiritual and Emotional Health Keeping a healthy spiritual life can help you better manage your physical health. Your spiritual life can help you to cope with any issues that may arise with your physical health.  Balance can keep [place] healthy and help [place] to recover.  If you are struggling with your spiritual health there are questions that you may want to ask yourself:  What makes me feel most complete? When do I feel most connected to the rest of the world? Where do I find the most inner strength? What am I doing when I feel whole?  Helpful tips: Being in nature. Some people feel very connected and at peace when they are walking outdoors or are outside. Helping others. Some feel the largest sense of wellbeing when they are of service to others. Being of service can take on many forms. It can be doing volunteer work, being kind to strangers, or offering a hand to a friend in need. Gratitude. Some people find they feel the most connected when they remain grateful. They may make lists of all the things they are grateful for or say a thank you out loud for all they have.    Emotional Health Are you in tune with your emotional health?  Check out this link: http://www.marquez-love.com/   Financial Health Make sure you use a budget for your personal finances Make sure you are insured against risks (health insurance, life insurance, auto insurance, etc) Save more, spend less Set financial goals If you need help in this area, good resources include counseling through [employer] or other community resources, have a meeting with a Social research officer, government, and a good resource is [employer]

## 2024-01-19 ENCOUNTER — Other Ambulatory Visit: Payer: Self-pay | Admitting: Medical

## 2024-01-19 DIAGNOSIS — E785 Hyperlipidemia, unspecified: Secondary | ICD-10-CM

## 2024-01-19 LAB — CBC
Hematocrit: 40.7 % (ref 34.0–46.6)
Hemoglobin: 13.1 g/dL (ref 11.1–15.9)
MCH: 25.9 pg — ABNORMAL LOW (ref 26.6–33.0)
MCHC: 32.2 g/dL (ref 31.5–35.7)
MCV: 81 fL (ref 79–97)
Platelets: 198 10*3/uL (ref 150–450)
RBC: 5.05 x10E6/uL (ref 3.77–5.28)
RDW: 13.8 % (ref 11.7–15.4)
WBC: 5 10*3/uL (ref 3.4–10.8)

## 2024-01-19 LAB — MICROALBUMIN / CREATININE URINE RATIO
Creatinine, Urine: 146.2 mg/dL
Microalb/Creat Ratio: 4 mg/g{creat} (ref 0–29)
Microalbumin, Urine: 5.7 ug/mL

## 2024-01-19 LAB — LIPID PANEL
Cholesterol, Total: 114 mg/dL (ref 100–199)
HDL: 45 mg/dL (ref >39)
LDL CALC COMMENT:: 2.5 ratio (ref 0.0–4.4)
LDL Chol Calc (NIH): 56 mg/dL (ref 0–99)
Triglycerides: 61 mg/dL (ref 0–149)
VLDL Cholesterol Cal: 13 mg/dL (ref 5–40)

## 2024-01-19 MED ORDER — LOSARTAN POTASSIUM 25 MG PO TABS: 25.0000 mg | ORAL_TABLET | Freq: Every day | ORAL | 2 refills | Status: DC

## 2024-01-19 MED ORDER — ATORVASTATIN CALCIUM 40 MG PO TABS: 40.0000 mg | ORAL_TABLET | Freq: Every day | ORAL | 2 refills | Status: DC

## 2024-01-19 NOTE — Progress Notes (Signed)
 Results sent through MyChart

## 2024-01-30 ENCOUNTER — Telehealth: Payer: Self-pay | Admitting: Internal Medicine

## 2024-01-30 NOTE — Telephone Encounter (Signed)
 Pt was notified.

## 2024-01-30 NOTE — Telephone Encounter (Signed)
 Copied from CRM [id number]. Topic: Clinical - Medication Question >> Jan 30, 2024  8:11 AM Antwanette L wrote: Reason for CRM: Patient has been  using erythromycin ophthalmic ointment for the past six days and the patient has an upcoming on Wednesday(3/19) with her optometrist. The patient wants to know if she could continue to use the ointment. Patient is requesting a callback at [phone].

## 2024-02-01 DIAGNOSIS — E119 Type 2 diabetes mellitus without complications: Secondary | ICD-10-CM | POA: Diagnosis not present

## 2024-02-01 DIAGNOSIS — H40033 Anatomical narrow angle, bilateral: Secondary | ICD-10-CM | POA: Diagnosis not present

## 2024-02-01 DIAGNOSIS — H00014 Hordeolum externum left upper eyelid: Secondary | ICD-10-CM | POA: Diagnosis not present

## 2024-02-02 ENCOUNTER — Ambulatory Visit: Payer: Medicare PPO | Admitting: Medical

## 2024-02-04 ENCOUNTER — Other Ambulatory Visit: Payer: Self-pay | Admitting: Nurse Practitioner

## 2024-02-20 ENCOUNTER — Ambulatory Visit
Admission: RE | Admit: 2024-02-20 | Discharge: 2024-02-20 | Disposition: A | Discharge status: Home / Self Care | Payer: Medicare PPO | Source: Ambulatory Visit | Attending: Medical | Admitting: Medical

## 2024-02-20 DIAGNOSIS — Z1231 Encounter for screening mammogram for malignant neoplasm of breast: Secondary | ICD-10-CM | POA: Diagnosis not present

## 2024-03-07 ENCOUNTER — Ambulatory Visit (INDEPENDENT_AMBULATORY_CARE_PROVIDER_SITE_OTHER): Payer: Self-pay

## 2024-03-07 ENCOUNTER — Other Ambulatory Visit: Payer: Self-pay

## 2024-03-07 ENCOUNTER — Ambulatory Visit

## 2024-03-07 DIAGNOSIS — M810 Age-related osteoporosis without current pathological fracture: Secondary | ICD-10-CM | POA: Insufficient documentation

## 2024-03-07 LAB — CREATININE URINE, TIMED
CREATININE TIMED URINE: 25.87 mg/dL
CREATININE/SPECIMEN: 815 mg/{specimen} (ref 600–1800)
URINE COLLECTION DURATION: 24 h
URINE VOLUME: 3150 mL

## 2024-03-07 LAB — CALCIUM,TIMED URINE
CALCIUM TIMED URINE: 7.2 mg/dL
CALCIUM/SPECIMEN: 226.8 mg/{specimen} (ref 42.0–353.0)
URINE COLLECTION DURATION: 24 h
URINE VOLUME: 3150 mL

## 2024-03-10 ENCOUNTER — Other Ambulatory Visit: Payer: Self-pay

## 2024-03-10 ENCOUNTER — Ambulatory Visit

## 2024-03-12 ENCOUNTER — Other Ambulatory Visit: Payer: Self-pay

## 2024-03-12 ENCOUNTER — Ambulatory Visit

## 2024-03-12 DIAGNOSIS — M810 Age-related osteoporosis without current pathological fracture: Secondary | ICD-10-CM | POA: Insufficient documentation

## 2024-03-12 DIAGNOSIS — I519 Heart disease, unspecified: Secondary | ICD-10-CM | POA: Insufficient documentation

## 2024-03-12 DIAGNOSIS — E039 Hypothyroidism, unspecified: Secondary | ICD-10-CM | POA: Insufficient documentation

## 2024-03-12 LAB — COMPREHENSIVE METABOLIC PANEL, NON-FASTING
ALBUMIN: 3.8 g/dL (ref 3.5–4.8)
ALKALINE PHOSPHATASE: 85 U/L (ref 45–117)
ALT (SGPT): 20 U/L (ref 10–49)
ANION GAP: 9 mmol/L (ref 3–16)
AST (SGOT): 26 U/L (ref 12–45)
BILIRUBIN TOTAL: 0.6 mg/dL (ref <=1.0)
BUN/CREA RATIO: 23 (ref 8–30)
BUN: 19 mg/dL (ref <=22)
CALCIUM: 9.3 mg/dL (ref 8.5–10.5)
CHLORIDE: 106 mmol/L (ref 96–107)
CO2 TOTAL: 28 mmol/L (ref 23–35)
CREATININE: 0.81 mg/dL (ref 0.55–1.02)
ESTIMATED GFR: >60 mL/min/{1.73_m2} (ref >59)
GLOBULIN: 3.6 g/dL — ABNORMAL HIGH (ref 2.3–3.5)
GLUCOSE: 92 mg/dL (ref 70–100)
POTASSIUM: 4 mmol/L (ref 3.5–5.0)
PROTEIN TOTAL: 7.4 g/dL (ref 6.4–8.2)
SODIUM: 143 mmol/L (ref 135–145)

## 2024-03-12 LAB — THYROID STIMULATING HORMONE (SENSITIVE TSH): TSH: 1.17 u[IU]/mL (ref 0.350–4.940)

## 2024-03-12 LAB — THYROXINE, FREE (FREE T4): THYROXINE (T4), FREE: 1.2 ng/dL (ref 0.76–1.46)

## 2024-03-12 LAB — MAGNESIUM: MAGNESIUM: 2 mg/dL (ref 1.6–2.4)

## 2024-03-12 LAB — PHOSPHORUS: PHOSPHORUS: 3.6 mg/dL (ref 2.4–4.3)

## 2024-03-12 LAB — PARATHYROID HORMONE (PTH): PTH: 25.9 pg/mL (ref 18.4–80.1)

## 2024-03-12 LAB — PROTEIN FOR ELECTROPHORESIS: PROTEIN TOTAL: 7.2 g/dL (ref 5.6–7.6)

## 2024-03-12 LAB — ALBUMIN FOR ELECTROPHORESIS: ALBUMIN: 3.7 g/dL (ref 3.4–4.8)

## 2024-03-13 LAB — MONOCLONAL GAMMOPATHY PROFILE WITH IMMUNOTYPING REFLEX
ALBUMIN: 3.7 g/dL (ref 3.4–4.8)
KAPPA FREE LIGHT CHAINS: 2.33 mg/dL (ref 1.25–3.25)
KAPPA/LAMBDA FLC RATIO: 1.69 (ref 0.80–2.10)
LAMBDA FREE LIGHT CHAINS: 1.38 mg/dL (ref 0.60–2.70)
TOTAL PROTEIN: 7.2 g/dL

## 2024-03-13 LAB — KAPPA AND LAMBDA FREE LIGHT CHAINS, SERUM
KAPPA FREE LIGHT CHAINS: 2.33 mg/dL (ref 1.25–3.25)
KAPPA/LAMBDA FLC RATIO: 1.69 (ref 0.80–2.10)
LAMBDA FREE LIGHT CHAINS: 1.38 mg/dL (ref 0.60–2.70)

## 2024-03-14 ENCOUNTER — Ambulatory Visit: Payer: Self-pay

## 2024-03-14 ENCOUNTER — Other Ambulatory Visit: Payer: Self-pay

## 2024-03-14 ENCOUNTER — Encounter (INDEPENDENT_AMBULATORY_CARE_PROVIDER_SITE_OTHER): Payer: Self-pay

## 2024-03-14 VITALS — BP 102/64 | HR 74 | Temp 98.4°F | Wt 142.2 lb

## 2024-03-14 DIAGNOSIS — E039 Hypothyroidism, unspecified: Secondary | ICD-10-CM | POA: Insufficient documentation

## 2024-03-14 DIAGNOSIS — M810 Age-related osteoporosis without current pathological fracture: Secondary | ICD-10-CM | POA: Insufficient documentation

## 2024-03-14 DIAGNOSIS — E559 Vitamin D deficiency, unspecified: Secondary | ICD-10-CM | POA: Insufficient documentation

## 2024-03-14 DIAGNOSIS — Z79890 Hormone replacement therapy (postmenopausal): Secondary | ICD-10-CM

## 2024-03-14 NOTE — Assessment & Plan Note (Addendum)
 Prior level 64    - discussed goal >30ng/mL  - continue bone restore supplement with total 1000 units

## 2024-03-14 NOTE — Assessment & Plan Note (Addendum)
 Chronic  Clinically euthyroid  On levothyroxine  112mcg 6 days a week, skipping Sundays    - given age and symptoms goal TSH; mid- high normal   -  TSH within range, continue levothyroxine  75mcg 1 tablet 6 days a week

## 2024-03-14 NOTE — Assessment & Plan Note (Addendum)
 Risk factors osteoporosis including advancing age, gender, vitamin D  deficiency  T score -2.6 at Left hip ; DXA 11/2023  Other secondary causes ruled out including hyperparathyroidism and multiple myeloma.       - Discussed the diagnosis of osteoporosis and available treatments including anti-resorptive therapy and anabolic therapy. I also discussed the rare risk of ONJ and atypical femoral fractures associated with anti-resorptive therapy.    - Recommend continuing vitamin D  intake of 1000 units per day and a total of 1000 mg/day of calcium, including both dietary sources and supplements.     Advised to see dentist and see if there any procedures that need to be done prior to starting an anti-resorptive agent and inform them of plan to start anti-resorptive agent.    Encouraged weight-bearing, balance and muscle strengthening exercises.     Discussed fall precautions.    - at this time patient would like to continue lifestyle changes with exercise only  - discussed no conclusive evidence regarding exercise programs with improvement in BMD or reduction of fracture risk   - would benefit from fosmax or reclast  DXA due 11/2025

## 2024-03-14 NOTE — Patient Instructions (Addendum)
 Continue levothyroxine  75mcg 1 tablet 6 days a week.     Thyroid  hormone should be taken daily on an empty stomach 30-60 minutes before breakfast and separated from supplements such as multivitamins, calcium, and iron by at least four hours.     Antacids, such as Nexium, Prilosec, Protonix, should be taken at least one hour apart from levothyroxine  and caffeine should be avoided for 30-60 minutes following levothyroxine .    Aim to get at least 1000 mg calcium intake daily in diet.       Continue current vitamin D  therapy.       If taking biotin or multivitamin, please hold 4 days prior to lab work.       - will be due for repeat DXA scan 12/08/2025

## 2024-03-14 NOTE — Progress Notes (Signed)
 ENDOCRINOLOGY, [facility]  [address]  [place] [address]  Operated by [facility]    Endocrinology Follow up Patient Visit      NAME: Kristin Kennedy DOS: 03/14/2024   MRN: [id number] DOB: July 14, 1951     Reason for Visit: Follow Up (F/U Thyroid )    LOV 11/2023    PCP - Cort Dine, MD    Subjective: Kristin Kennedy is a 73 y.o. female with PMH of Hypothyroidism, Osteoporosis, vitamin D  deficiency for follow up.     Since last office visit has really increased calcium intake, has also started biotin. Has improved nail strength, and less hair shedding. Is working with the osteo strong team in [place] and is enjoying this experience.   -------  Current med: Levothyroxine  75mcg 1 tablet 6 days a week, taking it appropriately on an empty stomach, waiting an hour to eat or drink anything.  Not missing doses. Has been on this dose for few years.     Denies palpitations, tremors, diarrhea.   Menstrual cycles; s/p hysterectomy     Current calcium and Vit D intake:  Dietary sources: Milk; 1 glasses a day. Cheese: 0 servings a week, Yogurt: 0 servings a week.   Life extension bone restore; 4 tablets daily   supplements: Ca: in bone restore  Vitamin D : in bone restore   Multivitamins: 0 tabs     No known family history of thyroid  disease, thyroid  cancer, diabetes.     Used to work as Social worker.     Denies history of tobacco use. Denies alcohol intake.     The following portions of the patient's history were reviewed and updated as appropriate: allergies, current medications, past family history, past medical history, past social history, past surgical history and problem list.     Past Medical History:   Diagnosis Date    BCC (basal cell carcinoma), eyelid     Cervical dysplasia     Hypothyroidism     Miscarriage     Osteoporosis     Thalassemia minor          Past Surgical History:   Procedure Laterality Date    BASAL CELL CARCINOMA EXCISION      EYELID    HX BREAST BIOPSY      HX  COLPOSCOPY      HX CRYOSURGERY OF CERVIX      HX HYSTERECTOMY      TVH WO BSO    OTHER SURGICAL HISTORY      FX OF FEMUR     Family Medical History:       Problem Relation (Age of Onset)    Breast Cancer Maternal Aunt, Maternal cousin    Diabetes Paternal Grandfather    High Cholesterol Father    Hypertension (High Blood Pressure) Mother    Melanoma Paternal Grandfather             Social History     Socioeconomic History    Marital status: Married   Tobacco Use    Smoking status: Never    Smokeless tobacco: Never   Vaping Use    Vaping status: Never Used   Substance and Sexual Activity    Alcohol use: Never    Drug use: Never    Sexual activity: Yes     Partners: Male     Birth control/protection: Surgical     Social Determinants of Health      Received from [facility] [place]  [facility])    Transportation       Allergies:  No Known Allergies    Medications:  Current Outpatient Medications   Medication Instructions    BIOTIN ORAL Daily    cholecalciferol (Vitamin D3) 5,000 Units    ferrous sulfate (FERATAB) 324 mg, EVERY OTHER DAY    levothyroxine  (SYNTHROID) 75 mcg Oral Tablet Take 1 tablet 6 days a week, skip Sundays.    Magnesium 200 mg, Daily    omega 3-dha-epa-fish oil 1,000 mg (250 mg-750 mg)/5 mL Oral Liquid 1,000 mg    UNKNOWN MEDICATION (UNKNOWN MEDICATION) BONE RESTORE WITH CALCIUM. VIT D AND K       Problem List:  Patient Active Problem List    Diagnosis Date Noted    Hypothyroidism 09/08/2023    Age-related osteoporosis without current pathological fracture 09/08/2023    Vitamin D  deficiency 09/08/2023    Dense breast 02/04/2022    Family history of breast cancer 02/04/2022         Review of Systems:  All systems reviewed. All negative save for those detailed in HPI.     Objective:  BP 102/64 (Site: Left Arm, Patient Position: Sitting)   Pulse 74   Temp 36.9 C (98.4 F)   Wt 64.5 kg (142 lb 3.2 oz)   SpO2 98%   BMI 25.19 kg/m       Wt Readings from Last 3  Encounters:   03/14/24 64.5 kg (142 lb 3.2 oz)   12/27/23 63.5 kg (140 lb)   12/12/23 63 kg (138 lb 12.8 oz)      Patient examined today, changes as noted below.    GENERAL APPEARANCE: alert, cooperative, oriented, and in no acute distress.    HEENT: Head Normocephalic, Atraumatic.    EYES: conjunctivae/corneas clear. PERRL   NECK: Neck supple. No cervical adenopathy.    CHEST AND LUNGS: Clear to auscultation bilaterally  CARDIOVASCULAR: Regular rate and rhythm, no murmurs  ABDOMEN: Soft, non-tender.   EXTREMITIES: No edema in lower extremities b/l.    SKIN: normal color and texture  NEUROLOGIC: Gait normal. No tremor noted with outstretched arms.  PSYCH: Normal mood and affect    Labs:      No results found for: "HA1C"  Lab Results   Component Value Date    TSH 1.170 03/12/2024    FREET4 1.20 03/12/2024     No results found for: "WBC", "HGB", "HCT", "MCV", "MCH", "PLT"  Lab Results   Component Value Date    CO2 28 03/12/2024    BUN 19 03/12/2024    CREATININE 0.81 03/12/2024    CALCIUM 9.3 03/12/2024     No components found for: "GFRAA", "GFRNONAA"  Lab Results   Component Value Date    ALT 20 03/12/2024    AST 26 03/12/2024    ALKPHOS 85 03/12/2024    ALBUMIN 3.8 03/12/2024    ALBUMIN 3.7 03/12/2024     No results found for: "CHOLESTEROL", "HDLCHOL", "LDLCHOL", "TRIG"  No results found for: "MICALBCRERAT"  No results found for: "TSI"  Lab Results   Component Value Date    CALCIUM 9.3 03/12/2024       Imaging:    Dxa 11/2023      Dxa 2021      Cytology/Pathology:      Assessment & Plan:    Hypothyroidism  Chronic  Clinically euthyroid  On levothyroxine  6 days a week, skipping Sundays    - given age and symptoms  goal TSH; mid- high normal   -  TSH within range, continue levothyroxine  75mcg 1 tablet 6 days a week      Vitamin D  deficiency  Prior level 64    - discussed goal >30ng/mL  - continue bone restore supplement with total 1000 units        Age-related osteoporosis without current pathological  fracture  Risk factors osteoporosis including advancing age, gender, vitamin D  deficiency  T score -2.6 at Left hip ; DXA 11/2023  Other secondary causes ruled out including hyperparathyroidism and multiple myeloma.       - Discussed the diagnosis of osteoporosis and available treatments including anti-resorptive therapy and anabolic therapy. I also discussed the rare risk of ONJ and atypical femoral fractures associated with anti-resorptive therapy.    - Recommend continuing vitamin D  intake of 1000 units per day and a total of 1000 mg/day of calcium, including both dietary sources and supplements.     Advised to see dentist and see if there any procedures that need to be done prior to starting an anti-resorptive agent and inform them of plan to start anti-resorptive agent.    Encouraged weight-bearing, balance and muscle strengthening exercises.     Discussed fall precautions.    - at this time patient would like to continue lifestyle changes with exercise only  - discussed no conclusive evidence regarding exercise programs with improvement in BMD or reduction of fracture risk   - would benefit from fosmax or reclast  DXA due 11/2025      Follow up in 6 months     All questions answered. Patient expressed understanding and agreement.   I appreciate the opportunity to participate in this patient's care.    Dr. Armin Landing  Staff Endocrinologist  [facility], Diabetes & Metabolism    [facility]  [address]   [place] [address]  P: [phone]  F: [phone]    [facility]  [address]  [place], [place] [address]    On the day of the encounter, a total of 30 minutes was spent on this patient encounter including review of historical information, examination, documentation and post-visit activities. The time documented excludes procedural time.    Electronically signed by Armin Landing, MD    03/14/24

## 2024-03-26 DIAGNOSIS — E119 Type 2 diabetes mellitus without complications: Secondary | ICD-10-CM | POA: Diagnosis not present

## 2024-03-26 DIAGNOSIS — H40033 Anatomical narrow angle, bilateral: Secondary | ICD-10-CM | POA: Diagnosis not present

## 2024-03-26 LAB — HM DIABETES EYE EXAM

## 2024-04-04 ENCOUNTER — Ambulatory Visit: Payer: Medicare PPO | Admitting: Nurse Practitioner

## 2024-04-04 ENCOUNTER — Encounter: Payer: Medicare PPO | Attending: Medical | Admitting: Nutrition

## 2024-04-04 ENCOUNTER — Encounter: Payer: Self-pay | Admitting: Nurse Practitioner

## 2024-04-04 VITALS — BP 116/66 | HR 76 | Ht 63.75 in | Wt 167.8 lb

## 2024-04-04 VITALS — Ht 64.0 in | Wt 167.0 lb

## 2024-04-04 DIAGNOSIS — E782 Mixed hyperlipidemia: Secondary | ICD-10-CM

## 2024-04-04 DIAGNOSIS — Z794 Long term (current) use of insulin: Secondary | ICD-10-CM | POA: Diagnosis not present

## 2024-04-04 DIAGNOSIS — E1165 Type 2 diabetes mellitus with hyperglycemia: Secondary | ICD-10-CM

## 2024-04-04 DIAGNOSIS — I10 Essential (primary) hypertension: Secondary | ICD-10-CM | POA: Diagnosis not present

## 2024-04-04 DIAGNOSIS — E559 Vitamin D deficiency, unspecified: Secondary | ICD-10-CM

## 2024-04-04 DIAGNOSIS — E039 Hypothyroidism, unspecified: Secondary | ICD-10-CM

## 2024-04-04 DIAGNOSIS — Z7985 Long-term (current) use of injectable non-insulin antidiabetic drugs: Secondary | ICD-10-CM

## 2024-04-04 LAB — POCT GLYCOSYLATED HEMOGLOBIN (HGB A1C): Hemoglobin A1C: 7.8 % — AB (ref 4.0–5.6)

## 2024-04-04 MED ORDER — TRESIBA FLEXTOUCH 100 UNIT/ML ~~LOC~~ SOPN
28.0000 [IU] | PEN_INJECTOR | Freq: Every day | SUBCUTANEOUS | 3 refills | Status: DC
Start: 2024-04-04 — End: 2024-08-02

## 2024-04-04 MED ORDER — OZEMPIC (2 MG/DOSE) 8 MG/3ML ~~LOC~~ SOPN: 2.0000 mg | PEN_INJECTOR | SUBCUTANEOUS | 3 refills | Status: DC

## 2024-04-04 NOTE — Progress Notes (Unsigned)
 Medical Nutrition Therapy:  0932 Appt start time: end time:   1018  Assessment:  Primary concerns today: Diabetes Type 2 x 15 years.   Eating more kale, baked chicken, vegetables and fruit. Saw Hulon Magic today with [facility]. Bowels are much better since on Ozempic  and not on Metformin  No more diarrhea. Is aware of potential constipation on Ozempic . Encouraged high fiber foods and hydration. A1C up to 7.8%. More recent blood sugars are better. Started water aerobics. Got a knee injection recently. CMG shows blood sugars near 150's. Wt stable. Trying to cook more at home. Still eats out twice a day. Trying to make better choices when eating out and cutting down on higher fat and salty foods.  Lab Results  Component Value Date   HGBA1C 7.8 (A) 04/04/2024    Wt Readings from Last 3 Encounters:  04/04/24 167 lb 12.8 oz (76.1 kg)  01/18/24 166 lb 9.6 oz (75.6 kg)  12/05/23 168 lb (76.2 kg)   Ht Readings from Last 3 Encounters:  04/04/24 5' 3.75" (1.619 m)  01/18/24 5\' 4"  (1.626 m)  12/05/23 5\' 4"  (1.626 m)   There is no height or weight on file to calculate BMI. @BMIFA @ Facility age limit for growth %iles is 20 years. Facility age limit for growth %iles is 20 years.    Lab Results  Component Value Date   HGBA1C 7.8 (A) 04/04/2024      Latest Ref Rng & Units 11/24/2023    9:11 AM 03/22/2023    8:48 AM 11/22/2022    8:38 AM  CMP  Glucose 70 - 99 mg/dL 244  010  272   BUN 8 - 27 mg/dL 16  18  15    Creatinine 0.57 - 1.00 mg/dL 5.36  6.44  0.34   Sodium 134 - 144 mmol/L 142  140  142   Potassium 3.5 - 5.2 mmol/L 4.2  4.3  4.2   Chloride 96 - 106 mmol/L 106  102  105   CO2 20 - 29 mmol/L 23  21  21    Calcium  8.7 - 10.3 mg/dL 9.6  9.8  9.4   Total Protein 6.0 - 8.5 g/dL 6.6  7.1  6.8   Total Bilirubin 0.0 - 1.2 mg/dL 0.7  0.7  0.9   Alkaline Phos 44 - 121 IU/L 84  86  81   AST 0 - 40 IU/L 17  21  17    ALT 0 - 32 IU/L 23  32  30      Preferred  Learning Style: No preference indicated   Learning Readiness:  Ready Change in progress  MEDICATIONS:    DIETARY INTAKE:   24-hr recall:  B ( AM): English muffin, egg whites, sausage patty, water Lunch: 3 tenders and french fries, water Dinner chicken casserole, blacked eyed peas, green beans, cornbreaf muffin and water    Beverages: water  Usual physical activity: walking.  Estimated energy needs: 1200 calories 135 g carbohydrates 90 g protein 33  g fat  Progress Towards Goal(s):  In progress.   Nutritional Diagnosis:  NB-1.1 Food and nutrition-related knowledge deficit As related to DIabetes Type 2.  As evidenced by A1C 7.4%%    Intervention:  Meal planning, portion control, high fiber diet.  Cut out high fat foods. Continue to increase high fiber foods from fruits, vegetables and whole grains-- foods from garden Go to [employer] 3 times per week Keep up the great job. Get A1C to  7%  Teaching Method Utilized:  Visual Auditory Hands on  Handouts given during visit include: Discussed using [employer] and [email or web address] websites for recipe ideas.  Barriers to learning/adherence to lifestyle change: none  Demonstrated degree of understanding via:  Teach Back   Monitoring/Evaluation:  Dietary intake, exercise, , and body weight in 3 month(s).

## 2024-04-04 NOTE — Progress Notes (Signed)
 04/04/2024, 9:42 AM                            Endocrinology follow-up note  Subjective:    Patient ID: Courtney Barry, female    DOB: [birth year].  Courtney Barry is being seen in follow-up for management of currently uncontrolled symptomatic diabetes requested by  Claudene Crystal, PA-C.   Past Medical History:  Diagnosis Date   Arthritis    generalized- RIGHT hip   Diabetes mellitus, type II (HCC)    Encounter for specialized medical examination 09/04/2013   Hyperlipidemia    on meds   Hypertension    on meds   Hypothyroidism    on meds   Menopausal vaginal dryness    Normal gynecologic examination 09/05/2014   Seasonal allergies    Vaccine counseling 05/21/2021    Past Surgical History:  Procedure Laterality Date   ABDOMINAL HYSTERECTOMY     APPENDECTOMY     CHOLECYSTECTOMY     COLONOSCOPY  2014   Dr.Richardson-hems/tics/abnormal rectal exam-good prep   COLONOSCOPY  06/2023   Dr. Lajuan Pila, diverticulosis and polpys, repeat 7 years   TONSILLECTOMY      Social History   Socioeconomic History   Marital status: Married    Spouse name: Not on file   Number of children: Not on file   Years of education: Not on file   Highest education level: Not on file  Occupational History   Not on file  Tobacco Use   Smoking status: Never   Smokeless tobacco: Never  Vaping Use   Vaping status: Never Used  Substance and Sexual Activity   Alcohol use: Never   Drug use: Never   Sexual activity: Yes    Partners: Male    Comment: 1st intercourse- 60, partners- 1, married- 44 yrs,  Other Topics Concern   Not on file  Social History Narrative   Lives at home with husband .  Was prior teacher and counselor, taught business courses in high school.  Exercise - silver sneakers.  Hobbies - golf some.  01/2024.   Social Drivers of Corporate investment banker Strain: Low Risk  (01/18/2024)    Overall Financial Resource Strain (CARDIA)    Difficulty of Paying Living Expenses: Not hard at all  Food Insecurity: No Food Insecurity (01/18/2024)   Hunger Vital Sign    Worried About Running Out of Food in the Last Year: Never true    Ran Out of Food in the Last Year: Never true  Transportation Needs: No Transportation Needs (01/18/2024)   PRAPARE - Administrator, Civil Service (Medical): No    Lack of Transportation (Non-Medical): No  Physical Activity: Sufficiently Active (01/18/2024)   Exercise Vital Sign    Days of Exercise per Week: 2 days    Minutes of Exercise per Session: 90 min  Stress: No Stress Concern Present (01/18/2024)   [employer] of Occupational Health - Occupational Stress Questionnaire    Feeling of Stress : Not at all  Social Connections: Socially Integrated (01/18/2024)   Social Connection and  Isolation Panel [NHANES]    Frequency of Communication with Friends and Family: Once a week    Frequency of Social Gatherings with Friends and Family: Three times a week    Attends Religious Services: More than 4 times per year    Active Member of Clubs or Organizations: Yes    Attends Engineer, structural: More than 4 times per year    Marital Status: Married    Family History  Problem Relation Age of Onset   Colon polyps Mother 59   Thyroid  disease Mother    Cancer Mother    Pancreatic cancer Mother 21   Hypertension Father    Kidney disease Father    Thyroid  cancer Sister        died at 60   Colon cancer Neg Hx    Esophageal cancer Neg Hx    Stomach cancer Neg Hx    Rectal cancer Neg Hx     Outpatient Encounter Medications as of 04/04/2024  Medication Sig   ACCU-CHEK GUIDE TEST test strip USE TO CHECK BLOOD GLUCOSE TWICE DAILY   Accu-Chek Softclix Lancets lancets Use as instructed to monitor glucose twice daily   atorvastatin  (LIPITOR) 40 MG tablet Take 1 tablet (40 mg total) by mouth daily.   Blood Glucose Monitoring Suppl  (ACCU-CHEK GUIDE ME) w/Device KIT Use as directed to check blood glucose twice daily before breakfast and bed. DX E11.65   Cholecalciferol (VITAMIN D ) 125 MCG (5000 UT) CAPS Take 1 capsule by mouth daily.   Insulin  Pen Needle (BD PEN NEEDLE NANO U/F) 32G X 4 MM MISC 1 Units by Does not apply route daily at 2 PM.   losartan  (COZAAR ) 25 MG tablet Take 1 tablet (25 mg total) by mouth daily.   Multiple Vitamin (MULTIVITAMIN) tablet Take 1 tablet by mouth daily.   SYNTHROID  112 MCG tablet Take 1 tablet (112 mcg total) by mouth daily.   [DISCONTINUED] Semaglutide , 2 MG/DOSE, (OZEMPIC , 2 MG/DOSE,) 8 MG/3ML SOPN INJECT 2 MG AS DIRECTED ONCE A WEEK.   [DISCONTINUED] TRESIBA  FLEXTOUCH 100 UNIT/ML FlexTouch Pen Inject 25 Units into the skin at bedtime.   loratadine (CLARITIN) 10 MG tablet Take 10 mg by mouth daily as needed for allergies, rhinitis or itching.   Semaglutide , 2 MG/DOSE, (OZEMPIC , 2 MG/DOSE,) 8 MG/3ML SOPN Inject 2 mg into the skin once a week.   TRESIBA  FLEXTOUCH 100 UNIT/ML FlexTouch Pen Inject 28 Units into the skin at bedtime.   [DISCONTINUED] erythromycin  ophthalmic ointment Place 1 Application into the left eye at bedtime. (Patient not taking: Reported on 04/04/2024)   No facility-administered encounter medications on file as of 04/04/2024.    ALLERGIES: Allergies  Allergen Reactions   Latex     Gloves, band aids- causes rash   Sulfa Antibiotics Rash    VACCINATION STATUS: Immunization History  Administered Date(s) Administered   Fluad Quad(high Dose 65+) 09/10/2020, 08/14/2021, 08/03/2022   Fluad Trivalent(High Dose 65+) 08/29/2023   Influenza-Unspecified 08/09/2019   PFIZER Comirnaty(Gray Top)Covid-19 Tri-Sucrose Vaccine 05/28/2021   PFIZER(Purple Top)SARS-COV-2 Vaccination 12/31/2019, 01/02/2020, 08/25/2020   Pneumococcal Conjugate-13 11/24/2020   Pneumococcal Polysaccharide-23 01/19/2022    Diabetes She presents for her follow-up diabetic visit. She has type 2 diabetes  mellitus. Onset time: She was diagnosed at approximate age of 73 years. Her disease course has been stable. There are no hypoglycemic associated symptoms. Pertinent negatives for hypoglycemia include no confusion, headaches, nervousness/anxiousness, pallor, seizures or tremors. Pertinent negatives for diabetes include no chest pain, no fatigue,  no polydipsia, no polyphagia, no polyuria and no weight loss. There are no hypoglycemic complications. Symptoms are stable. Diabetic complications include nephropathy and retinopathy. Risk factors for coronary artery disease include diabetes mellitus, dyslipidemia, hypertension, post-menopausal, sedentary lifestyle and obesity. Current diabetic treatment includes insulin  injections (and Ozempic ). She is compliant with treatment most of the time. Her weight is fluctuating minimally. She is following a generally healthy diet. When asked about meal planning, she reported none. She has not had a previous visit with a dietitian. She participates in exercise intermittently. Her home blood glucose trend is fluctuating minimally. Her breakfast blood glucose range is generally 130-140 mg/dl. Her bedtime blood glucose range is generally 140-180 mg/dl. (She presents today with her meter and logs showing mostly at goal glycemic profile overall.  Her POCT A1c today is 7.8%, increasing slightly from last visit of 7.7%.  Analysis of her meter shows 7-day average of 150, 14-day average of 149, 30-day average of 136, 90-day average of 155.  She denies any hypoglycemia.  She has started water aerobics to help with knee pain.  She notes she will be getting gel injected into her knee soon.  She has been cooking more suppers recently, but does eat breakfast and lunch at restaurants mostly.) An ACE inhibitor/angiotensin II receptor blocker is being taken. She does not see a podiatrist.Eye exam is current.  Hyperlipidemia This is a chronic problem. The current episode started more than 1 year  ago. The problem is controlled. Recent lipid tests were reviewed and are normal. Exacerbating diseases include diabetes. There are no known factors aggravating her hyperlipidemia. Pertinent negatives include no chest pain, myalgias or shortness of breath. Current antihyperlipidemic treatment includes statins. The current treatment provides moderate improvement of lipids. There are no compliance problems.  Risk factors for coronary artery disease include diabetes mellitus, dyslipidemia, hypertension, a sedentary lifestyle and post-menopausal.  Thyroid  Problem Presents for follow-up visit. Patient reports no anxiety, cold intolerance, constipation, depressed mood, diarrhea, fatigue, heat intolerance, leg swelling, palpitations, tremors, weight gain or weight loss. The symptoms have been stable. Her past medical history is significant for diabetes.  Hypertension This is a chronic problem. The current episode started more than 1 year ago. The problem has been resolved since onset. The problem is controlled. Pertinent negatives include no chest pain, headaches, palpitations or shortness of breath. Agents associated with hypertension include thyroid  hormones. Risk factors for coronary artery disease include diabetes mellitus, dyslipidemia, family history, post-menopausal state and sedentary lifestyle. Past treatments include angiotensin blockers. The current treatment provides moderate improvement. There are no compliance problems.  Hypertensive end-organ damage includes retinopathy. Identifiable causes of hypertension include a thyroid  problem.    Review of systems  Constitutional: + stable body weight,  current Body mass index is 29.03 kg/m. , + fatigue, no subjective hypothermia Eyes: no blurry vision, no xerophthalmia ENT: no sore throat, no nodules palpated in throat, no dysphagia/odynophagia, no hoarseness Cardiovascular: no chest pain, no shortness of breath, no palpitations, no leg  swelling Respiratory: no cough, no shortness of breath Gastrointestinal: no nausea/vomiting/diarrhea Musculoskeletal: left hip pain, bilateral knee pain- doing water aerobics now Skin: no rashes, no hyperemia Neurological: no tremors, no numbness, no tingling, no dizziness Psychiatric: no depression, no anxiety   Objective:    BP 116/66 (BP Location: Right Arm, Patient Position: Sitting, Cuff Size: Large)   Pulse 76   Ht 5' 3.75" (1.619 m)   Wt 167 lb 12.8 oz (76.1 kg)   BMI 29.03 kg/m   Wt  Readings from Last 3 Encounters:  04/04/24 167 lb 12.8 oz (76.1 kg)  01/18/24 166 lb 9.6 oz (75.6 kg)  12/05/23 168 lb (76.2 kg)    BP Readings from Last 3 Encounters:  04/04/24 116/66  01/18/24 122/68  12/05/23 119/73     Physical Exam- Limited  Constitutional:  Body mass index is 29.03 kg/m. , not in acute distress, normal state of mind Eyes:  EOMI, no exophthalmos Musculoskeletal: no gross deformities, strength intact in all four extremities, no gross restriction of joint movements Skin:  no rashes, no hyperemia Neurological: no tremor with outstretched hands     Recent Results (from the past 2160 hours)  CBC     Status: Abnormal   Collection Time: 01/18/24  9:37 AM  Result Value Ref Range   WBC 5.0 3.4 - 10.8 x10E3/uL   RBC 5.05 3.77 - 5.28 x10E6/uL   Hemoglobin 13.1 11.1 - 15.9 g/dL   Hematocrit 16.1 09.6 - 46.6 %   MCV 81 79 - 97 fL   MCH 25.9 (L) 26.6 - 33.0 pg   MCHC 32.2 31.5 - 35.7 g/dL   RDW 04.5 40.9 - 81.1 %   Platelets 198 150 - 450 x10E3/uL  Lipid panel     Status: None   Collection Time: 01/18/24  9:37 AM  Result Value Ref Range   Cholesterol, Total 114 100 - 199 mg/dL   Triglycerides 61 0 - 149 mg/dL   HDL 45 >91 mg/dL   VLDL Cholesterol Cal 13 5 - 40 mg/dL   LDL Chol Calc (NIH) 56 0 - 99 mg/dL   Chol/HDL Ratio 2.5 0.0 - 4.4 ratio    Comment:                                   T. Chol/HDL Ratio                                             Men  Women                                1/2 Avg.Risk  3.4    3.3                                   Avg.Risk  5.0    4.4                                2X Avg.Risk  9.6    7.1                                3X Avg.Risk 23.4   11.0   Microalbumin/Creatinine Ratio, Urine     Status: None   Collection Time: 01/18/24  9:37 AM  Result Value Ref Range   Creatinine, Urine 146.2 Not Estab. mg/dL   Microalbumin, Urine 5.7 Not Estab. ug/mL   Microalb/Creat Ratio 4 0 - 29 mg/g creat    Comment:  Normal:                0 -  29                        Moderately increased: 30 - 300                        Severely increased:       >300   POCT Urinalysis DIP (Proadvantage Device)     Status: None   Collection Time: 01/18/24 10:03 AM  Result Value Ref Range   Color, UA yellow yellow   Clarity, UA clear clear   Glucose, UA negative negative mg/dL   Bilirubin, UA negative negative   Ketones, POC UA negative negative mg/dL   Specific Gravity, Urine 1.020    Blood, UA negative negative   pH, UA 6.0 5.0 - 8.0   Protein Ur, POC negative negative mg/dL   Urobilinogen, Ur 0.2    Nitrite, UA Negative Negative   Leukocytes, UA Negative Negative  HM DIABETES EYE EXAM     Status: None   Collection Time: 03/26/24 11:42 AM  Result Value Ref Range   HM Diabetic Eye Exam No Retinopathy No Retinopathy    Comment: Abstracted by HIM  HgB A1c     Status: Abnormal   Collection Time: 04/04/24  9:14 AM  Result Value Ref Range   Hemoglobin A1C 7.8 (A) 4.0 - 5.6 %   HbA1c POC (<> result, manual entry)     HbA1c, POC (prediabetic range)     HbA1c, POC (controlled diabetic range)          Assessment & Plan:   1) Uncontrolled type 2 diabetes mellitus with hyperglycemia (HCC)  - Rhiann Hayes Rehfeldt has currently controlled asymptomatic type 2 DM since  73 years of age.  She presents today with her meter and logs showing mostly at goal glycemic profile overall.  Her POCT A1c today is 7.8%, increasing  slightly from last visit of 7.7%.  Analysis of her meter shows 7-day average of 150, 14-day average of 149, 30-day average of 136, 90-day average of 155.  She denies any hypoglycemia.  She has started water aerobics to help with knee pain.  She notes she will be getting gel injected into her knee soon.  She has been cooking more suppers recently, but does eat breakfast and lunch at restaurants mostly.  -her diabetes is complicated by retinopathy obesity/sedentary life and she remains at a high risk for more acute and chronic complications which include CAD, CVA, CKD, retinopathy, and neuropathy. These are all discussed in detail with her.  - Nutritional counseling repeated at each appointment due to patients tendency to fall back in to old habits.  - The patient admits there is a room for improvement in their diet and drink choices. -  Suggestion is made for the patient to avoid simple carbohydrates from their diet including Cakes, Sweet Desserts / Pastries, Ice Cream, Soda (diet and regular), Sweet Tea, Candies, Chips, Cookies, Sweet Pastries, Store Bought Juices, Alcohol in Excess of 1-2 drinks a day, Artificial Sweeteners, Coffee Creamer, and "Sugar-free" Products. This will help patient to have stable blood glucose profile and potentially avoid unintended weight gain.   - I encouraged the patient to switch to unprocessed or minimally processed complex starch and increased protein intake (animal or plant source), fruits, and vegetables.   - Patient is advised to stick to  a routine mealtimes to eat 3 meals a day and avoid unnecessary snacks (to snack only to correct hypoglycemia).  - I have approached her with the following individualized plan to manage  her diabetes and patient agrees:   -She will continue to benefit from simplified treatment regimen, given her tight control of diabetes.    -She is advised to continue her Ozempic  2 mg SQ weekly and increase her Tresiba  slightly to 28 units SQ  nightly.    -She is encouraged to routinely fingerstick twice daily, before breakfast and before bed, and to call the clinic if she has readings less than 70 or above 300 for 3 tests in a row.  She did not tolerate CGM (adhesive allergy).  - She has allergy to sulfa medications, therefore not a candidate for glipizide.  She did have significant diarrhea with Metformin , therefore will avoid that as well.  - Specific targets for  A1c;  LDL, HDL, Triglycerides,  were discussed with the patient.  2) Blood Pressure /Hypertension:  Her blood pressure is controlled to target.  She is advised to continue Losartan  25 mg po daily.  3) Lipids/Hyperlipidemia:  Her most recent lipid panel from 04/13/22 shows controlled LDL at 50.  She is advised to continue Lipitor 40 mg po daily at bedtime.  Side effects and precautions discussed with her.    4) Hypothyroidism- longstanding diagnosis since at least from her 43s.  There are no recent TFTs to review.   She is advised to continue Synthroid  112 mcg po daily before breakfast.  She does have family history of thyroid  cancer (she is unsure of which type).  This may help determine her risk factor and if she needs to stop GLP1 therapy in the future.  Will recheck TFTs prior to next visit.   - We discussed about the correct intake of her thyroid  hormone, on empty stomach at fasting, with water, separated by at least 30 minutes from breakfast and other medications,  and separated by more than 4 hours from calcium , iron, multivitamins, acid reflux medications (PPIs). -Patient is made aware of the fact that thyroid  hormone replacement is needed for life, dose to be adjusted by periodic monitoring of thyroid  function tests.  5)  Weight/Diet:  Her Body mass index is 29.03 kg/m.-   she is a candidate for some weight loss. I discussed with her the fact that loss of 5 - 10% of her  current body weight will have the most impact on her diabetes management.  Exercise, and  detailed carbohydrates information provided  -  detailed on discharge instructions.  6) Chronic Care/Health Maintenance: -she is on ACEI/ARB and Statin medications and is encouraged to initiate and continue to follow up with Ophthalmology, Dentist, Podiatrist at least yearly or according to recommendations, and advised to stay away from smoking. I have recommended yearly flu vaccine and pneumonia vaccine at least every 5 years; moderate intensity exercise for up to 150 minutes weekly; and sleep for at least 7 hours a day.  - she is advised to maintain close follow up with Tysinger, Christiane Cowing, PA-C for primary care needs, as well as her other providers for optimal and coordinated care.     I spent  44  minutes in the care of the patient today including review of labs from CMP, Lipids, Thyroid  Function, Hematology (current and previous including abstractions from other facilities); face-to-face time discussing  her blood glucose readings/logs, discussing hypoglycemia and hyperglycemia episodes and symptoms, medications doses, her  options of short and long term treatment based on the latest standards of care / guidelines;  discussion about incorporating lifestyle medicine;  and documenting the encounter. Risk reduction counseling performed per USPSTF guidelines to reduce obesity and cardiovascular risk factors.     Please refer to Patient Instructions for Blood Glucose Monitoring and Insulin /Medications Dosing Guide"  in media tab for additional information. Please  also refer to " Patient Self Inventory" in the Media  tab for reviewed elements of pertinent patient history.  Kayin Orah Sonnen participated in the discussions, expressed understanding, and voiced agreement with the above plans.  All questions were answered to her satisfaction. she is encouraged to contact clinic should she have any questions or concerns prior to her return visit.    Follow up plan: - Return in about 4 months (around  08/05/2024) for Diabetes F/U with A1c in office, Thyroid  follow up, Previsit labs, Bring meter and logs.   Hulon Magic, [facility] [address] [place], [place] [address] Phone: [phone] Fax: [phone]  04/04/2024, 9:42 AM

## 2024-04-04 NOTE — Patient Instructions (Signed)
 Cut out high fat foods. Continue to increase high fiber foods from fruits, vegetables and whole grains-- foods from garden Go to [employer] 3 times per week Keep up the great job. Get A1C to 7%.

## 2024-04-11 ENCOUNTER — Encounter: Payer: Self-pay | Admitting: Nutrition

## 2024-05-07 ENCOUNTER — Encounter: Payer: Self-pay | Admitting: Nurse Practitioner

## 2024-05-07 ENCOUNTER — Other Ambulatory Visit: Payer: Self-pay | Admitting: Nurse Practitioner

## 2024-05-16 ENCOUNTER — Ambulatory Visit: Admitting: Medical

## 2024-05-16 VITALS — BP 118/68 | HR 103 | Temp 98.5°F | Ht 64.0 in | Wt 165.4 lb

## 2024-05-16 DIAGNOSIS — H00014 Hordeolum externum left upper eyelid: Secondary | ICD-10-CM | POA: Diagnosis not present

## 2024-05-16 MED ORDER — ERYTHROMYCIN 5 MG/GM OP OINT: 1.0000 | TOPICAL_OINTMENT | Freq: Every day | OPHTHALMIC | 1 refills | Status: DC

## 2024-05-16 NOTE — Progress Notes (Signed)
 Subjective:  Courtney Barry is a 73 y.o. female who presents for Chief Complaint  Patient presents with   Acute Visit    Eye stye, started Sunday, painful, swollen. Had it back in march when she was here     Here for stye of the left eye.  She has had this before.  The last time she had it she ended up having a go to eye doctor to have it lanced.  She is trying to prevent having to do that this time.  She is using some topical triple antibiotic externally and some Pataday eyedrops internally  No other aggravating or relieving factors.    No other c/o.  Past Medical History:  Diagnosis Date   Arthritis    generalized- RIGHT hip   Diabetes mellitus, type II (HCC)    Encounter for specialized medical examination 09/04/2013   Hyperlipidemia    on meds   Hypertension    on meds   Hypothyroidism    on meds   Menopausal vaginal dryness    Normal gynecologic examination 09/05/2014   Seasonal allergies    Vaccine counseling 05/21/2021   Current Outpatient Medications on File Prior to Visit  Medication Sig Dispense Refill   atorvastatin  (LIPITOR) 40 MG tablet Take 1 tablet (40 mg total) by mouth daily. 90 tablet 2   Cholecalciferol (VITAMIN D ) 125 MCG (5000 UT) CAPS Take 1 capsule by mouth daily.     loratadine (CLARITIN) 10 MG tablet Take 10 mg by mouth daily as needed for allergies, rhinitis or itching.     losartan  (COZAAR ) 25 MG tablet Take 1 tablet (25 mg total) by mouth daily. 90 tablet 2   Multiple Vitamin (MULTIVITAMIN) tablet Take 1 tablet by mouth daily.     Semaglutide , 2 MG/DOSE, (OZEMPIC , 2 MG/DOSE,) 8 MG/3ML SOPN Inject 2 mg into the skin once a week. 9 mL 3   SYNTHROID  112 MCG tablet Take 1 tablet (112 mcg total) by mouth daily. 90 tablet 1   TRESIBA  FLEXTOUCH 100 UNIT/ML FlexTouch Pen Inject 28 Units into the skin at bedtime. 25 mL 3   ACCU-CHEK GUIDE TEST test strip USE TO CHECK BLOOD GLUCOSE TWICE DAILY 200 strip 2   Accu-Chek Softclix Lancets lancets USE AS  INSTRUCTED TO MONITOR GLUCOSE TWICE DAILY 100 each 12   Blood Glucose Monitoring Suppl (ACCU-CHEK GUIDE ME) w/Device KIT Use as directed to check blood glucose twice daily before breakfast and bed. DX E11.65 1 kit 0   Insulin  Pen Needle (BD PEN NEEDLE NANO U/F) 32G X 4 MM MISC 1 Units by Does not apply route daily at 2 PM. 100 each 3   No current facility-administered medications on file prior to visit.     The following portions of the patient's history were reviewed and updated as appropriate: allergies, current medications, past family history, past medical history, past social history, past surgical history and problem list.  ROS Otherwise as in subjective above  Objective: BP 118/68   Pulse (!) 103   Temp 98.5 F (36.9 C)   Ht 5' 4 (1.626 m)   Wt 165 lb 6.4 oz (75 kg)   SpO2 98%   BMI 28.39 kg/m   General appearance: alert, no distress, well developed, well nourished Left upper eyelid with raised nodule without fluctuance or erythema consistent with stye.  Otherwise eyes normal appearing, EOMI, PERRLA, no conjunctival erythema pus or drainage    Assessment: Encounter Diagnosis  Name Primary?   Hordeolum externum of  left upper eyelid Yes     Plan: Continue warm compresses, can continue Pataday in the morning, begin E-Mycin  ointment nightly, use mild soap and water for normal hygiene in the face daily.  If not much improved within the next 7 days then go ahead and get in with your eye doctor   Elaijah was seen today for acute visit.  Diagnoses and all orders for this visit:  Hordeolum externum of left upper eyelid  Other orders -     erythromycin  ophthalmic ointment; Place 1 Application into the left eye at bedtime.    Follow up: As needed

## 2024-05-23 ENCOUNTER — Ambulatory Visit: Admitting: Medical

## 2024-06-04 ENCOUNTER — Telehealth (INDEPENDENT_AMBULATORY_CARE_PROVIDER_SITE_OTHER): Payer: Self-pay | Admitting: Medical

## 2024-06-04 DIAGNOSIS — Z1231 Encounter for screening mammogram for malignant neoplasm of breast: Secondary | ICD-10-CM

## 2024-06-04 NOTE — Telephone Encounter (Signed)
 Notified patient that order put in. Patient was seen this year in February.Avelina Rex, MA

## 2024-06-04 NOTE — Telephone Encounter (Signed)
 Mammogram order placed.  Please let patient know breast and pelvic exams are recommended annually, especially since she is no longer seeing the breast center.  Woodie Gully, PA-C

## 2024-06-04 NOTE — Telephone Encounter (Signed)
 Patient called in for a mammogram order. She was last seen in 12/22/2022 and comes back 12/27/2024. Patient will just need the order for this year. She gets them done at [facility].     Preferred Pharmacy       [facility] [address], [place] - [address]    [address] [place] [address]    Phone: [phone] Fax: [phone]    Hours: Not open 24 hours          Kristin Kennedy

## 2024-06-04 NOTE — Telephone Encounter (Signed)
 Kristin Kennedy, patient was last seen in the office 12-27-23. [facility] was [facility]. In their last note they said that patient would prefer to have this done by her gyne doctor. What to do for patient?Avelina Rex, MA

## 2024-07-27 ENCOUNTER — Encounter (INDEPENDENT_AMBULATORY_CARE_PROVIDER_SITE_OTHER): Payer: Self-pay

## 2024-07-27 ENCOUNTER — Ambulatory Visit
Admission: RE | Admit: 2024-07-27 | Discharge: 2024-07-27 | Disposition: A | Discharge status: Home / Self Care | Payer: Self-pay | Source: Ambulatory Visit | Attending: Medical | Admitting: Medical

## 2024-07-27 ENCOUNTER — Other Ambulatory Visit: Payer: Self-pay

## 2024-07-27 DIAGNOSIS — Z1231 Encounter for screening mammogram for malignant neoplasm of breast: Secondary | ICD-10-CM | POA: Insufficient documentation

## 2024-07-31 DIAGNOSIS — E1165 Type 2 diabetes mellitus with hyperglycemia: Secondary | ICD-10-CM | POA: Diagnosis not present

## 2024-07-31 DIAGNOSIS — E039 Hypothyroidism, unspecified: Secondary | ICD-10-CM | POA: Diagnosis not present

## 2024-08-01 LAB — COMPREHENSIVE METABOLIC PANEL WITH GFR
ALT: 22 IU/L (ref 0–32)
AST: 16 IU/L (ref 0–40)
Albumin: 4 g/dL (ref 3.9–4.9)
Alkaline Phosphatase: 85 IU/L (ref 49–135)
BUN/Creatinine Ratio: 18 (ref 12–28)
BUN: 13 mg/dL (ref 8–27)
Bilirubin Total: 0.6 mg/dL (ref 0.0–1.2)
CO2: 22 mmol/L (ref 20–29)
Calcium: 9.3 mg/dL (ref 8.7–10.3)
Chloride: 105 mmol/L (ref 96–106)
Creatinine, Ser: 0.72 mg/dL (ref 0.57–1.00)
Globulin, Total: 2.6 g/dL (ref 1.5–4.5)
Glucose: 135 mg/dL — ABNORMAL HIGH (ref 70–99)
Potassium: 4.1 mmol/L (ref 3.5–5.2)
Sodium: 141 mmol/L (ref 134–144)
Total Protein: 6.6 g/dL (ref 6.0–8.5)
eGFR: 90 mL/min/1.73 (ref >59)

## 2024-08-01 LAB — TSH: TSH: 0.322 u[IU]/mL — ABNORMAL LOW (ref 0.450–4.500)

## 2024-08-01 LAB — T4, FREE: Free T4: 1.66 ng/dL (ref 0.82–1.77)

## 2024-08-02 ENCOUNTER — Other Ambulatory Visit: Payer: Self-pay | Admitting: Nurse Practitioner

## 2024-08-02 DIAGNOSIS — E1165 Type 2 diabetes mellitus with hyperglycemia: Secondary | ICD-10-CM

## 2024-08-02 DIAGNOSIS — Z794 Long term (current) use of insulin: Secondary | ICD-10-CM

## 2024-08-02 DIAGNOSIS — Z7985 Long-term (current) use of injectable non-insulin antidiabetic drugs: Secondary | ICD-10-CM

## 2024-08-07 ENCOUNTER — Ambulatory Visit: Admitting: Nurse Practitioner

## 2024-08-07 ENCOUNTER — Encounter: Payer: Self-pay | Admitting: Nutrition

## 2024-08-07 ENCOUNTER — Encounter: Payer: Self-pay | Admitting: Nurse Practitioner

## 2024-08-07 ENCOUNTER — Other Ambulatory Visit: Payer: Self-pay | Admitting: *Deleted

## 2024-08-07 ENCOUNTER — Encounter: Attending: Medical | Admitting: Nutrition

## 2024-08-07 VITALS — BP 120/64 | HR 94 | Ht 64.0 in | Wt 165.2 lb

## 2024-08-07 VITALS — Ht 64.0 in | Wt 165.0 lb

## 2024-08-07 DIAGNOSIS — E039 Hypothyroidism, unspecified: Secondary | ICD-10-CM | POA: Diagnosis not present

## 2024-08-07 DIAGNOSIS — E1165 Type 2 diabetes mellitus with hyperglycemia: Secondary | ICD-10-CM | POA: Diagnosis not present

## 2024-08-07 DIAGNOSIS — E782 Mixed hyperlipidemia: Secondary | ICD-10-CM

## 2024-08-07 DIAGNOSIS — Z7985 Long-term (current) use of injectable non-insulin antidiabetic drugs: Secondary | ICD-10-CM | POA: Diagnosis not present

## 2024-08-07 DIAGNOSIS — Z794 Long term (current) use of insulin: Secondary | ICD-10-CM | POA: Diagnosis not present

## 2024-08-07 DIAGNOSIS — E559 Vitamin D deficiency, unspecified: Secondary | ICD-10-CM

## 2024-08-07 DIAGNOSIS — I10 Essential (primary) hypertension: Secondary | ICD-10-CM

## 2024-08-07 LAB — POCT GLYCOSYLATED HEMOGLOBIN (HGB A1C): Hemoglobin A1C: 7.6 % — AB (ref 4.0–5.6)

## 2024-08-07 MED ORDER — TRESIBA FLEXTOUCH 100 UNIT/ML ~~LOC~~ SOPN: 28.0000 [IU] | PEN_INJECTOR | Freq: Every day | SUBCUTANEOUS | 3 refills | Status: AC

## 2024-08-07 MED ORDER — OZEMPIC (2 MG/DOSE) 8 MG/3ML ~~LOC~~ SOPN: 2.0000 mg | PEN_INJECTOR | SUBCUTANEOUS | 3 refills | Status: DC

## 2024-08-07 MED ORDER — SYNTHROID 112 MCG PO TABS: 112.0000 ug | ORAL_TABLET | Freq: Every day | ORAL | 3 refills | Status: AC

## 2024-08-07 NOTE — Progress Notes (Signed)
 08/07/2024, 9:42 AM                            Endocrinology follow-up note  Subjective:    Patient ID: Courtney Barry, female    DOB: Jul 19, 1951.  Courtney Barry is being seen in follow-up for management of currently uncontrolled symptomatic diabetes requested by  Bulah Alm RAMAN, PA-C.   Past Medical History:  Diagnosis Date   Arthritis    generalized- RIGHT hip   Diabetes mellitus, type II (HCC)    Encounter for specialized medical examination 09/04/2013   Hyperlipidemia    on meds   Hypertension    on meds   Hypothyroidism    on meds   Menopausal vaginal dryness    Normal gynecologic examination 09/05/2014   Seasonal allergies    Vaccine counseling 05/21/2021    Past Surgical History:  Procedure Laterality Date   ABDOMINAL HYSTERECTOMY     APPENDECTOMY     CHOLECYSTECTOMY     COLONOSCOPY  2014   Dr.Richardson-hems/tics/abnormal rectal exam-good prep   COLONOSCOPY  06/2023   Dr. Lynnie Bring, diverticulosis and polpys, repeat 7 years   TONSILLECTOMY      Social History   Socioeconomic History   Marital status: Married    Spouse name: Not on file   Number of children: Not on file   Years of education: Not on file   Highest education level: Not on file  Occupational History   Not on file  Tobacco Use   Smoking status: Never   Smokeless tobacco: Never  Vaping Use   Vaping status: Never Used  Substance and Sexual Activity   Alcohol use: Never   Drug use: Never   Sexual activity: Yes    Partners: Male    Comment: 1st intercourse- 43, partners- 1, married- 44 yrs,  Other Topics Concern   Not on file  Social History Narrative   Lives at home with husband .  Was prior teacher and counselor, taught business courses in high school.  Exercise - silver sneakers.  Hobbies - golf some.  01/2024.   Social Drivers of Corporate investment banker Strain: Low Risk  (01/18/2024)    Overall Financial Resource Strain (CARDIA)    Difficulty of Paying Living Expenses: Not hard at all  Food Insecurity: No Food Insecurity (01/18/2024)   Hunger Vital Sign    Worried About Running Out of Food in the Last Year: Never true    Ran Out of Food in the Last Year: Never true  Transportation Needs: No Transportation Needs (01/18/2024)   PRAPARE - Administrator, Civil Service (Medical): No    Lack of Transportation (Non-Medical): No  Physical Activity: Sufficiently Active (01/18/2024)   Exercise Vital Sign    Days of Exercise per Week: 2 days    Minutes of Exercise per Session: 90 min  Stress: No Stress Concern Present (01/18/2024)   [employer] of Occupational Health - Occupational Stress Questionnaire    Feeling of Stress : Not at all  Social Connections: Socially Integrated (01/18/2024)   Social Connection and  Isolation Panel    Frequency of Communication with Friends and Family: Once a week    Frequency of Social Gatherings with Friends and Family: Three times a week    Attends Religious Services: More than 4 times per year    Active Member of Clubs or Organizations: Yes    Attends Engineer, structural: More than 4 times per year    Marital Status: Married    Family History  Problem Relation Age of Onset   Colon polyps Mother 41   Thyroid  disease Mother    Cancer Mother    Pancreatic cancer Mother 57   Hypertension Father    Kidney disease Father    Thyroid  cancer Sister        died at 50   Colon cancer Neg Hx    Esophageal cancer Neg Hx    Stomach cancer Neg Hx    Rectal cancer Neg Hx     Outpatient Encounter Medications as of 08/07/2024  Medication Sig   atorvastatin  (LIPITOR) 40 MG tablet Take 1 tablet (40 mg total) by mouth daily. (Patient taking differently: Take 20 mg by mouth daily.)   Blood Glucose Monitoring Suppl (ACCU-CHEK GUIDE ME) w/Device KIT Use as directed to check blood glucose twice daily before breakfast and bed. DX E11.65    Cholecalciferol (VITAMIN D ) 125 MCG (5000 UT) CAPS Take 1 capsule by mouth daily.   Insulin  Pen Needle (BD PEN NEEDLE NANO U/F) 32G X 4 MM MISC 1 Units by Does not apply route daily at 2 PM.   loratadine (CLARITIN) 10 MG tablet Take 10 mg by mouth daily as needed for allergies, rhinitis or itching.   losartan  (COZAAR ) 25 MG tablet Take 1 tablet (25 mg total) by mouth daily.   Multiple Vitamin (MULTIVITAMIN) tablet Take 1 tablet by mouth daily.   [DISCONTINUED] Semaglutide , 2 MG/DOSE, (OZEMPIC , 2 MG/DOSE,) 8 MG/3ML SOPN Inject 2 mg into the skin once a week.   [DISCONTINUED] SYNTHROID  112 MCG tablet Take 1 tablet (112 mcg total) by mouth daily.   [DISCONTINUED] TRESIBA  FLEXTOUCH 100 UNIT/ML FlexTouch Pen Inject 28 Units into the skin at bedtime.   ACCU-CHEK GUIDE TEST test strip USE TO CHECK BLOOD GLUCOSE TWICE DAILY   Accu-Chek Softclix Lancets lancets USE AS INSTRUCTED TO MONITOR GLUCOSE TWICE DAILY   Semaglutide , 2 MG/DOSE, (OZEMPIC , 2 MG/DOSE,) 8 MG/3ML SOPN Inject 2 mg into the skin once a week.   SYNTHROID  112 MCG tablet Take 1 tablet (112 mcg total) by mouth daily.   TRESIBA  FLEXTOUCH 100 UNIT/ML FlexTouch Pen Inject 28 Units into the skin at bedtime.   [DISCONTINUED] erythromycin  ophthalmic ointment Place 1 Application into the left eye at bedtime.   No facility-administered encounter medications on file as of 08/07/2024.    ALLERGIES: Allergies  Allergen Reactions   Latex     Gloves, band aids- causes rash   Sulfa Antibiotics Rash    VACCINATION STATUS: Immunization History  Administered Date(s) Administered   Fluad Quad(high Dose 65+) 09/10/2020, 08/14/2021, 08/03/2022   Fluad Trivalent(High Dose 65+) 08/29/2023   Influenza-Unspecified 08/09/2019   PFIZER Comirnaty(Gray Top)Covid-19 Tri-Sucrose Vaccine 05/28/2021   PFIZER(Purple Top)SARS-COV-2 Vaccination 12/31/2019, 01/02/2020, 08/25/2020   Pneumococcal Conjugate-13 11/24/2020   Pneumococcal Polysaccharide-23 01/19/2022     Diabetes She presents for her follow-up diabetic visit. She has type 2 diabetes mellitus. Onset time: She was diagnosed at approximate age of 47 years. Her disease course has been stable. There are no hypoglycemic associated symptoms. Pertinent negatives for hypoglycemia  include no confusion, nervousness/anxiousness, pallor, seizures or tremors. Pertinent negatives for diabetes include no fatigue, no polydipsia, no polyphagia, no polyuria and no weight loss. There are no hypoglycemic complications. Symptoms are stable. Diabetic complications include nephropathy. Risk factors for coronary artery disease include diabetes mellitus, dyslipidemia, hypertension, post-menopausal, sedentary lifestyle and obesity. Current diabetic treatment includes insulin  injections (and Ozempic ). She is compliant with treatment most of the time. Her weight is fluctuating minimally. She is following a generally healthy diet. When asked about meal planning, she reported none. She has not had a previous visit with a dietitian. She participates in exercise intermittently. Her home blood glucose trend is fluctuating minimally. Her breakfast blood glucose range is generally 140-180 mg/dl. Her bedtime blood glucose range is generally 140-180 mg/dl. (She presents today with her meter and logs showing mostly at goal glycemic profile overall.  Her POCT A1c today is 7.6%, improving slightly from last visit of 7.8%.  Analysis of her meter shows 7-day average of 160, 14-day average of 157, 30-day average of 153, 90-day average of 151.  She denies any hypoglycemia.  She has been injecting only 26 units of insulin  to see what would happen with glucose.) An ACE inhibitor/angiotensin II receptor blocker is being taken. She does not see a podiatrist.Eye exam is current.  Thyroid  Problem Presents for follow-up visit. Patient reports no anxiety, cold intolerance, constipation, depressed mood, diarrhea, fatigue, heat intolerance, leg swelling,  tremors, weight gain or weight loss. The symptoms have been stable.    Review of systems  Constitutional: + stable body weight,  current Body mass index is 28.36 kg/m. , + fatigue, no subjective hypothermia Eyes: no blurry vision, no xerophthalmia ENT: no sore throat, no nodules palpated in throat, no dysphagia/odynophagia, no hoarseness Cardiovascular: no chest pain, no shortness of breath, no palpitations, no leg swelling Respiratory: no cough, no shortness of breath Gastrointestinal: no nausea/vomiting/diarrhea Musculoskeletal: left hip pain, bilateral knee pain- doing water aerobics Skin: no rashes, no hyperemia Neurological: no tremors, no numbness, no tingling, no dizziness Psychiatric: no depression, no anxiety   Objective:    BP 120/64 (BP Location: Left Arm, Patient Position: Sitting, Cuff Size: Large)   Pulse 94   Ht 5' 4 (1.626 m)   Wt 165 lb 3.2 oz (74.9 kg)   BMI 28.36 kg/m   Wt Readings from Last 3 Encounters:  08/07/24 165 lb 3.2 oz (74.9 kg)  05/16/24 165 lb 6.4 oz (75 kg)  04/04/24 167 lb (75.8 kg)    BP Readings from Last 3 Encounters:  08/07/24 120/64  05/16/24 118/68  04/04/24 116/66     Physical Exam- Limited  Constitutional:  Body mass index is 28.36 kg/m. , not in acute distress, normal state of mind Eyes:  EOMI, no exophthalmos Musculoskeletal: no gross deformities, strength intact in all four extremities, no gross restriction of joint movements Skin:  no rashes, no hyperemia Neurological: no tremor with outstretched hands   Diabetic Foot Exam - Simple   No data filed      Recent Results (from the past 2160 hours)  Comprehensive metabolic panel with GFR     Status: Abnormal   Collection Time: 07/31/24  8:29 AM  Result Value Ref Range   Glucose 135 (H) 70 - 99 mg/dL   BUN 13 8 - 27 mg/dL   Creatinine, Ser 9.27 0.57 - 1.00 mg/dL   eGFR 90 >40 fO/fpw/8.26   BUN/Creatinine Ratio 18 12 - 28   Sodium 141 134 - 144 mmol/L  Potassium  4.1 3.5 - 5.2 mmol/L   Chloride 105 96 - 106 mmol/L   CO2 22 20 - 29 mmol/L   Calcium  9.3 8.7 - 10.3 mg/dL   Total Protein 6.6 6.0 - 8.5 g/dL   Albumin 4.0 3.9 - 4.9 g/dL   Globulin, Total 2.6 1.5 - 4.5 g/dL   Bilirubin Total 0.6 0.0 - 1.2 mg/dL   Alkaline Phosphatase 85 49 - 135 IU/L    Comment:               **Please note reference interval change**   AST 16 0 - 40 IU/L   ALT 22 0 - 32 IU/L  TSH     Status: Abnormal   Collection Time: 07/31/24  8:29 AM  Result Value Ref Range   TSH 0.322 (L) 0.450 - 4.500 uIU/mL  T4, free     Status: None   Collection Time: 07/31/24  8:29 AM  Result Value Ref Range   Free T4 1.66 0.82 - 1.77 ng/dL  HgB J8r     Status: Abnormal   Collection Time: 08/07/24  9:25 AM  Result Value Ref Range   Hemoglobin A1C 7.6 (A) 4.0 - 5.6 %   HbA1c POC (<> result, manual entry)     HbA1c, POC (prediabetic range)     HbA1c, POC (controlled diabetic range)          Assessment & Plan:   1) Uncontrolled type 2 diabetes mellitus with hyperglycemia (HCC)  - Regan Haneen Bernales has currently controlled asymptomatic type 2 DM since  73 years of age.  She presents today with her meter and logs showing mostly at goal glycemic profile overall.  Her POCT A1c today is 7.6%, improving slightly from last visit of 7.8%.  Analysis of her meter shows 7-day average of 160, 14-day average of 157, 30-day average of 153, 90-day average of 151.  She denies any hypoglycemia.  She has been injecting only 26 units of insulin  to see what would happen with glucose.  -her diabetes is complicated by retinopathy obesity/sedentary life and she remains at a high risk for more acute and chronic complications which include CAD, CVA, CKD, retinopathy, and neuropathy. These are all discussed in detail with her.  - Nutritional counseling repeated at each appointment due to patients tendency to fall back in to old habits.  - The patient admits there is a room for improvement in their diet and  drink choices. -  Suggestion is made for the patient to avoid simple carbohydrates from their diet including Cakes, Sweet Desserts / Pastries, Ice Cream, Soda (diet and regular), Sweet Tea, Candies, Chips, Cookies, Sweet Pastries, Store Bought Juices, Alcohol in Excess of 1-2 drinks a day, Artificial Sweeteners, Coffee Creamer, and Sugar-free Products. This will help patient to have stable blood glucose profile and potentially avoid unintended weight gain.   - I encouraged the patient to switch to unprocessed or minimally processed complex starch and increased protein intake (animal or plant source), fruits, and vegetables.   - Patient is advised to stick to a routine mealtimes to eat 3 meals a day and avoid unnecessary snacks (to snack only to correct hypoglycemia).  - I have approached her with the following individualized plan to manage  her diabetes and patient agrees:   -She will continue to benefit from simplified treatment regimen, given her tight control of diabetes.    -She is advised to continue her Ozempic  2 mg SQ weekly and increase her Tresiba  slightly  to 28 units SQ nightly.    -She is encouraged to routinely fingerstick twice daily, before breakfast and before bed, and to call the clinic if she has readings less than 70 or above 300 for 3 tests in a row.  She did not tolerate CGM (adhesive allergy).  - She has allergy to sulfa medications, therefore not a candidate for glipizide.  She did have significant diarrhea with Metformin , therefore will avoid that as well.  - Specific targets for  A1c;  LDL, HDL, Triglycerides,  were discussed with the patient.  2) Blood Pressure /Hypertension:  Her blood pressure is controlled to target.  She is advised to continue meds as prescribed by her PCP.  3) Lipids/Hyperlipidemia:  Her most recent lipid panel from 01/18/24 shows controlled LDL at 56.  She is advised to continue Lipitor 40 mg po daily at bedtime.  Side effects and precautions  discussed with her.    4) Hypothyroidism- longstanding diagnosis since at least from her 27s.  Her previsit TFTs are consistent with appropriate hormone replacement (TSH slightly suppressed but Free T4 is normal and she denies any symptoms of over-replacement).   She is advised to continue Synthroid  112 mcg po daily before breakfast.  She does have family history of thyroid  cancer (she is unsure of which type).     - We discussed about the correct intake of her thyroid  hormone, on empty stomach at fasting, with water, separated by at least 30 minutes from breakfast and other medications,  and separated by more than 4 hours from calcium , iron, multivitamins, acid reflux medications (PPIs). -Patient is made aware of the fact that thyroid  hormone replacement is needed for life, dose to be adjusted by periodic monitoring of thyroid  function tests.  5)  Weight/Diet:  Her Body mass index is 28.36 kg/m.-   she is a candidate for some weight loss. I discussed with her the fact that loss of 5 - 10% of her  current body weight will have the most impact on her diabetes management.  Exercise, and detailed carbohydrates information provided  -  detailed on discharge instructions.  6) Chronic Care/Health Maintenance: -she is on ACEI/ARB and Statin medications and is encouraged to initiate and continue to follow up with Ophthalmology, Dentist, Podiatrist at least yearly or according to recommendations, and advised to stay away from smoking. I have recommended yearly flu vaccine and pneumonia vaccine at least every 5 years; moderate intensity exercise for up to 150 minutes weekly; and sleep for at least 7 hours a day.  - she is advised to maintain close follow up with Tysinger, Alm RAMAN, PA-C for primary care needs, as well as her other providers for optimal and coordinated care.     I spent  43  minutes in the care of the patient today including review of labs from CMP, Lipids, Thyroid  Function, Hematology  (current and previous including abstractions from other facilities); face-to-face time discussing  her blood glucose readings/logs, discussing hypoglycemia and hyperglycemia episodes and symptoms, medications doses, her options of short and long term treatment based on the latest standards of care / guidelines;  discussion about incorporating lifestyle medicine;  and documenting the encounter. Risk reduction counseling performed per USPSTF guidelines to reduce obesity and cardiovascular risk factors.     Please refer to Patient Instructions for Blood Glucose Monitoring and Insulin /Medications Dosing Guide  in media tab for additional information. Please  also refer to  Patient Self Inventory in the Media  tab for reviewed  elements of pertinent patient history.  Yeraldin Rashi Granier participated in the discussions, expressed understanding, and voiced agreement with the above plans.  All questions were answered to her satisfaction. she is encouraged to contact clinic should she have any questions or concerns prior to her return visit.    Follow up plan: - Return in about 4 months (around 12/07/2024) for Diabetes F/U with A1c in office, No previsit labs, Bring meter and logs.   Benton Rio, [facility] [address] [place], [place] [address] Phone: [phone] Fax: [phone]  08/07/2024, 9:42 AM

## 2024-08-07 NOTE — Patient Instructions (Signed)
 Cut out high fat foods. Continue to increase high fiber foods from fruits, vegetables and whole grains-- foods from garden Go to [employer] 3 times per week Keep up the great job. Get A1C to 7%.

## 2024-08-07 NOTE — Progress Notes (Signed)
 Medical Nutrition Therapy:  0945 Appt start time: end time:   1000 Assessment:  Primary concerns today: Diabetes Type 2 x 15 years.  Followup: Saw Benton Rio, FNP today.  A1C 7.6%. improved from 7.8%..  2 mg Ozempic ,  Tresiba  28 units a day. FBS 126 mg/dl.  Has some loose bowels with Ozempic . Citric stuff and onions causes loose bowels. Is working on noticing what foods she tolerates better than others.  Eats some fiber bars 1/4-1/2 of one if bowels are loose. Having GI issues on and off. Trying to eat healthier foods and being more intentional with what she eats.. Starting to get back to walking more.  Lab Results  Component Value Date   HGBA1C 7.6 (A) 08/07/2024    Wt Readings from Last 3 Encounters:  08/07/24 165 lb 3.2 oz (74.9 kg)  05/16/24 165 lb 6.4 oz (75 kg)  04/04/24 167 lb (75.8 kg)   Ht Readings from Last 3 Encounters:  08/07/24 5' 4 (1.626 m)  05/16/24 5' 4 (1.626 m)  04/04/24 5' 4 (1.626 m)   There is no height or weight on file to calculate BMI. @BMIFA @ Facility age limit for growth %iles is 20 years. Facility age limit for growth %iles is 20 years.    Lab Results  Component Value Date   HGBA1C 7.6 (A) 08/07/2024      Latest Ref Rng & Units 07/31/2024    8:29 AM 11/24/2023    9:11 AM 03/22/2023    8:48 AM  CMP  Glucose 70 - 99 mg/dL 864  849  848   BUN 8 - 27 mg/dL 13  16  18    Creatinine 0.57 - 1.00 mg/dL 9.27  9.18  9.29   Sodium 134 - 144 mmol/L 141  142  140   Potassium 3.5 - 5.2 mmol/L 4.1  4.2  4.3   Chloride 96 - 106 mmol/L 105  106  102   CO2 20 - 29 mmol/L 22  23  21    Calcium  8.7 - 10.3 mg/dL 9.3  9.6  9.8   Total Protein 6.0 - 8.5 g/dL 6.6  6.6  7.1   Total Bilirubin 0.0 - 1.2 mg/dL 0.6  0.7  0.7   Alkaline Phos 49 - 135 IU/L 85  84  86   AST 0 - 40 IU/L 16  17  21    ALT 0 - 32 IU/L 22  23  32      Preferred Learning Style: No preference indicated   Learning Readiness:  Ready Change in progress  MEDICATIONS:     DIETARY INTAKE:   24-hr recall:  B ( AM): English muffin, egg whites, sausage patty, water Lunch: 3 tenders and french fries, water Dinner chicken casserole, blacked eyed peas, green beans, cornbreaf muffin and water  Beverages: water  Usual physical activity: walking.  Estimated energy needs: 1200 calories 135 g carbohydrates 90 g protein 33  g fat  Progress Towards Goal(s):  In progress.   Nutritional Diagnosis:  NB-1.1 Food and nutrition-related knowledge deficit As related to DIabetes Type 2.  As evidenced by A1C 7.4%%    Intervention:  Meal planning, portion control, high fiber diet.  Cut out high fat foods. Continue to increase high fiber foods from fruits, vegetables and whole grains-- foods from garden Go to [employer] 3 times per week Keep up the great job. Get A1C to  7%  Teaching Method Utilized:  Visual Auditory Hands on  Handouts given during visit  include: Discussed using [employer] and [email or web address] websites for recipe ideas.  Barriers to learning/adherence to lifestyle change: none  Demonstrated degree of understanding via:  Teach Back   Monitoring/Evaluation:  Dietary intake, exercise, , and body weight in 3 month(s).

## 2024-08-09 ENCOUNTER — Other Ambulatory Visit (INDEPENDENT_AMBULATORY_CARE_PROVIDER_SITE_OTHER): Payer: Self-pay

## 2024-08-09 DIAGNOSIS — E039 Hypothyroidism, unspecified: Secondary | ICD-10-CM

## 2024-08-09 MED ORDER — LEVOTHYROXINE 75 MCG TABLET
ORAL_TABLET | ORAL | 3 refills | Status: DC
Start: 2024-08-09 — End: 2024-08-14

## 2024-08-09 NOTE — Telephone Encounter (Signed)
 Received fax request for refill:  Levothyroxine  75 mcg 1 daily #90    [facility]          Last scheduled appointment with you was 03/14/2024.  Currently scheduled next future appointment with you is 09/19/2024  The next office visit in this department is 09/19/2024    Maeola Coca, MA  08/09/2024, 08:53

## 2024-08-13 ENCOUNTER — Encounter (INDEPENDENT_AMBULATORY_CARE_PROVIDER_SITE_OTHER): Payer: Self-pay

## 2024-08-13 DIAGNOSIS — O039 Complete or unspecified spontaneous abortion without complication: Secondary | ICD-10-CM | POA: Insufficient documentation

## 2024-08-13 DIAGNOSIS — D563 Thalassemia minor: Secondary | ICD-10-CM | POA: Insufficient documentation

## 2024-08-13 DIAGNOSIS — C44111 Basal cell carcinoma of skin of unspecified eyelid, including canthus: Secondary | ICD-10-CM | POA: Insufficient documentation

## 2024-08-13 DIAGNOSIS — N879 Dysplasia of cervix uteri, unspecified: Secondary | ICD-10-CM | POA: Insufficient documentation

## 2024-08-14 ENCOUNTER — Ambulatory Visit (INDEPENDENT_AMBULATORY_CARE_PROVIDER_SITE_OTHER): Payer: Self-pay

## 2024-08-14 ENCOUNTER — Encounter (INDEPENDENT_AMBULATORY_CARE_PROVIDER_SITE_OTHER): Payer: Self-pay

## 2024-08-14 VITALS — BP 110/70 | HR 80 | Ht 63.0 in | Wt 144.0 lb

## 2024-08-14 DIAGNOSIS — M810 Age-related osteoporosis without current pathological fracture: Secondary | ICD-10-CM

## 2024-08-14 DIAGNOSIS — E039 Hypothyroidism, unspecified: Secondary | ICD-10-CM

## 2024-08-14 DIAGNOSIS — D563 Thalassemia minor: Secondary | ICD-10-CM

## 2024-08-14 DIAGNOSIS — E559 Vitamin D deficiency, unspecified: Secondary | ICD-10-CM

## 2024-08-14 DIAGNOSIS — Z803 Family history of malignant neoplasm of breast: Secondary | ICD-10-CM

## 2024-08-14 MED ORDER — LEVOTHYROXINE 75 MCG TABLET
75.0000 ug | ORAL_TABLET | ORAL | Status: DC
Start: 2024-08-14 — End: 2024-09-19

## 2024-08-14 NOTE — Assessment & Plan Note (Signed)
 Orders:    LIPID PANEL; Future    COMPREHENSIVE METABOLIC PANEL, NON-FASTING; Future    THYROID STIMULATING HORMONE WITH FREE T4 REFLEX; Future    CBC/DIFF; Future

## 2024-08-14 NOTE — Progress Notes (Signed)
 INTERNAL MEDICINE, MEDICAL OFFICE BUILDING  [address]  [place] [address]     Chief Complaint:    Chief Complaint   Patient presents with    Follow Up 6 Months       This note was created with assistance from [employer] via capture of conversational audio. Consent was obtained from the patient and all parties present prior to recording.      Allergies  Allergies[1]    Medications  Current Medications[2]    History  History of Present Illness  Kristin Kennedy is a 73 year old female with osteoporosis and hypothyroidism who presents for a follow-up on bone health and thyroid  management.    She is concerned about her bone health and has been engaging in weightlifting for the past six months, lifting approximately twenty pounds regularly. She is interested in exploring non-medication options to support her bone health, such as the Osteo Boost, a low vibrating belt. She prefers to avoid medications for osteoporosis. DEXA scan -2.6 T score left hip. She has discussed this and is being managed by endocrinologist.     She has hypothyroidism and is currently taking levothyroxine  0.75 mg six days a week, skipping Sundays. Her last TSH level in April was 1.170. She prefers to minimize medication use.    She has thalassemia minor and takes iron supplements every other day to manage her naturally low iron levels, which she believes helps with her hair health. No gastrointestinal intolerance to foods and no history of celiac sensitivity.    She maintains an active lifestyle, swimming four to five days a week, covering about half a mile each session. She recently had a mammogram and a colonoscopy in 2022, both of which were normal. She is up to date with her gynecological check-ups and plans to have her cholesterol and CBC checked in the future.     Past Medical History:   Diagnosis Date    BCC (basal cell carcinoma), eyelid     Cervical dysplasia     Hypothyroidism     Miscarriage     Osteoporosis     Thalassemia minor           Past Surgical History:   Procedure Laterality Date    BASAL CELL CARCINOMA EXCISION      EYELID    HX BREAST BIOPSY Left     benign    HX COLPOSCOPY      HX CRYOSURGERY OF CERVIX      HX HYSTERECTOMY      TVH WO BSO    OTHER SURGICAL HISTORY      FX OF FEMUR     Social History     Socioeconomic History    Marital status: Married   Tobacco Use    Smoking status: Never    Smokeless tobacco: Never   Vaping Use    Vaping status: Never Used   Substance and Sexual Activity    Alcohol use: Never    Drug use: Never    Sexual activity: Yes     Partners: Male     Birth control/protection: Surgical     Social Determinants of Health     Transportation Needs: Low Risk (11/14/2022)    Received from [facility])    Transportation     Has a lack of transportation kept you from medical appointments, meetings, work, or from getting things needed for daily living?: Not on file     Family Medical History:  Problem Relation (Age of Onset)    Breast Cancer Paternal Aunt    Diabetes Paternal Grandfather    High Cholesterol Father    Hypertension (High Blood Pressure) Mother    Melanoma Paternal Grandfather              Review of Systems:  All other systems reviewed and either negative or not pertinent.     Preventative Screenings:  Colonoscopy 2022 no polyps  GYN: UTD in [place]  Mammogram 2025 negative    Physical Examination:  Physical Exam  CARDIOVASCULAR: No carotid bruit     Vitals:    08/14/24 0817   BP: 110/70   Pulse: 80   SpO2: 96%   Weight: 65.3 kg (144 lb)   Height: 1.6 m (5' 3)   BMI: 25.51      Wt Readings from Last 2 Encounters:   08/14/24 65.3 kg (144 lb)   03/14/24 64.5 kg (142 lb 3.2 oz)       General: Alert and oriented, well nourished, No acute distress  HENT: PERRL  Full range of motion and supple.  No carotid bruits, no JVD, no palpable enlargement.  Lungs and Chest: Normal shape.  Lungs clear to auscultation bilaterally, no wheezes, rales or rhonchi.  Non-labored  respiration.  Cardiovascular: Regular rate and rhythm, S1 S2 no significant murmurs.   Abdomen: Soft, non-tender, non-distended, no hepatosplenomegaly, no palpable abdominal masses.  Bowel sounds normal, no abdominal bruits.  Musculoskeletal: Normal range of motion and strength, No tenderness, No swelling  Skin: Warm, dry and intact. No rashes, No lesions  Extremities  No edema bilateral, normal peripheral pulses. No clubbing or cyanosis.  Neurologic: No motor or sensory deficits.Psychiatric: Cooperative, appropriate mood and affect    Laboratory:   Results  LABS  TSH: 1.170 (02/2024)    RADIOLOGY  Mammogram: Normal    DIAGNOSTIC  Colonoscopy: No polyps (2022)     COMPLETE BLOOD COUNT   No results found for: WBC, HGB, HCT, PLTCNT, BANDS    DIFFERENTIAL  No results found for: PMNS, LYMPHOCYTES, MYELOCYTES, MONOCYTES, EOSINOPHIL, BASOPHILS, NRBCS, PMNABS, LYMPHSABS, EOSABS, MONOSABS, BASOSABS    COMPREHENSIVE METABOLIC PANEL  Lab Results   Component Value Date    SODIUM 143 03/12/2024    POTASSIUM 4.0 03/12/2024    CHLORIDE 106 03/12/2024    CO2 28 03/12/2024    ANIONGAP 9 03/12/2024    BUN 19 03/12/2024    CREATININE 0.81 03/12/2024    GLUCOSENF 92 03/12/2024    CALCIUM 9.3 03/12/2024    PHOSPHORUS 3.6 03/12/2024    ALB 3.7 03/12/2024    ALBUMIN 3.8 03/12/2024    ALBUMIN 3.7 03/12/2024    TOTALPROTEIN 7.4 03/12/2024    TOTALPROTEIN 7.2 03/12/2024    ALKPHOS 85 03/12/2024    AST 26 03/12/2024    ALT 20 03/12/2024    GFR >60 03/12/2024               No results found for: HA1C  No results found for: CHOLESTEROL, HDLCHOL, LDLCHOL, LDLCHOLDIR, TRIG   THYROID  STIMULATING HORMONE  Lab Results   Component Value Date    TSH 1.170 03/12/2024         Assessment & Plan  Hypothyroidism  TSH levels indicate good control. She is considering reducing medication and exploring alternative therapies.  - Adjust levothyroxine  to 0.75 mg five days a week until next blood work in October.  -  Monitor TSH levels in November to assess impact of adjusted dosing.  Osteoporosis  Concerned about bone health, engaging in weightlifting, interested in Osteo Boost as non-pharmacological intervention.  - Provide prescription for Osteo Boost if she decides to proceed.  - Continue weightlifting routine.  - Follow up with Dr. Demici for DEXA scan in two years.    Thalassemia minor  Takes iron supplements for hair health without gastrointestinal intolerance or celiac sensitivity.  - Continue iron supplementation every other day.  - Monitor iron levels and CBC as needed.    General Health Maintenance  Up to date with health maintenance. Recent mammogram and colonoscopy normal. Engages in regular physical activity.  - Schedule lipid panel and CBC at next visit.  - Continue regular physical activity, including swimming and weightlifting.     Assessment & Plan  Thalassemia minor    Orders:    LIPID PANEL; Future    COMPREHENSIVE METABOLIC PANEL, NON-FASTING; Future    THYROID  STIMULATING HORMONE WITH FREE T4 REFLEX; Future    CBC/DIFF; Future    FERRITIN; Future    IRON TRANSFERRIN AND TIBC; Future    VITAMIN D  25 TOTAL; Future    Vitamin D  deficiency    Orders:    LIPID PANEL; Future    COMPREHENSIVE METABOLIC PANEL, NON-FASTING; Future    THYROID  STIMULATING HORMONE WITH FREE T4 REFLEX; Future    CBC/DIFF; Future    FERRITIN; Future    IRON TRANSFERRIN AND TIBC; Future    VITAMIN D  25 TOTAL; Future    Osteoporosis, unspecified osteoporosis type, unspecified pathological fracture presence    Orders:    LIPID PANEL; Future    COMPREHENSIVE METABOLIC PANEL, NON-FASTING; Future    THYROID  STIMULATING HORMONE WITH FREE T4 REFLEX; Future    CBC/DIFF; Future    FERRITIN; Future    IRON TRANSFERRIN AND TIBC; Future    VITAMIN D  25 TOTAL; Future    Acquired hypothyroidism    Orders:    LIPID PANEL; Future    COMPREHENSIVE METABOLIC PANEL, NON-FASTING; Future    THYROID  STIMULATING HORMONE WITH FREE T4 REFLEX; Future     CBC/DIFF; Future    FERRITIN; Future    IRON TRANSFERRIN AND TIBC; Future    VITAMIN D  25 TOTAL; Future    Family history of breast cancer    Orders:    LIPID PANEL; Future    COMPREHENSIVE METABOLIC PANEL, NON-FASTING; Future    THYROID  STIMULATING HORMONE WITH FREE T4 REFLEX; Future    CBC/DIFF; Future    Hypothyroidism, unspecified type    Orders:    levothyroxine  (SYNTHROID) 75 mcg Oral Tablet; Take 1 Tablet (75 mcg total) by mouth Per instructions Take 1 tablet 6 days a week, skip Sundays. Indications: a condition with low thyroid  hormone levels       Continue same medications and treatments  Follow up:  Return in about 6 months (around 02/11/2025).    The patient was given the opportunity to ask questions and those questions were answered to the patient's satisfaction. The patient was encouraged to call with any additional questions or concerns.      Odella Areola, NP-C                 [1] No Known Allergies  [2]   Current Outpatient Medications:     ascorbic acid (VITAMIN C) 1,000 mg Oral Tablet, Take 1 Tablet (1,000 mg total) by mouth Daily, Disp: , Rfl:     BIOTIN ORAL, Take by mouth Daily OTC, Disp: , Rfl:     cholecalciferol, Vitamin D3,  125 mcg (5,000 unit) Oral Tablet, Take 1 Tablet (5,000 Units total) by mouth, Disp: , Rfl:     ferrous sulfate (FERATAB) 324 mg (65 mg iron) Oral Tablet, Delayed Release (E.C.), Take 1 Tablet (324 mg total) by mouth Every other day, Disp: , Rfl:     levothyroxine  (SYNTHROID) 75 mcg Oral Tablet, Take 1 Tablet (75 mcg total) by mouth Per instructions Take 1 tablet 6 days a week, skip Sundays. Indications: a condition with low thyroid  hormone levels, Disp: , Rfl:     Magnesium 200 mg Oral Tablet, Take 1 Tablet (200 mg total) by mouth Daily OTC, Disp: , Rfl:     omega 3-dha-epa-fish oil 1,000 mg (250 mg-750 mg)/5 mL Oral Liquid, Take 1,000 mg by mouth, Disp: , Rfl:     UNKNOWN MEDICATION (UNKNOWN MEDICATION), BONE RESTORE WITH CALCIUM. VIT D AND K, Disp: , Rfl:

## 2024-08-14 NOTE — Assessment & Plan Note (Signed)
 Orders:    LIPID PANEL; Future    COMPREHENSIVE METABOLIC PANEL, NON-FASTING; Future    THYROID  STIMULATING HORMONE WITH FREE T4 REFLEX; Future    CBC/DIFF; Future    FERRITIN; Future    IRON TRANSFERRIN AND TIBC; Future    VITAMIN D  25 TOTAL; Future

## 2024-08-14 NOTE — Assessment & Plan Note (Signed)
 Orders:    levothyroxine  (SYNTHROID) 75 mcg Oral Tablet; Take 1 Tablet (75 mcg total) by mouth Per instructions Take 1 tablet 6 days a week, skip Sundays. Indications: a condition with low thyroid  hormone levels

## 2024-08-20 ENCOUNTER — Ambulatory Visit

## 2024-08-20 DIAGNOSIS — Z23 Encounter for immunization: Secondary | ICD-10-CM

## 2024-08-27 ENCOUNTER — Encounter (INDEPENDENT_AMBULATORY_CARE_PROVIDER_SITE_OTHER): Admitting: Ophthalmology

## 2024-08-27 DIAGNOSIS — H43812 Vitreous degeneration, left eye: Secondary | ICD-10-CM | POA: Diagnosis not present

## 2024-08-27 DIAGNOSIS — H33301 Unspecified retinal break, right eye: Secondary | ICD-10-CM | POA: Diagnosis not present

## 2024-08-27 DIAGNOSIS — E119 Type 2 diabetes mellitus without complications: Secondary | ICD-10-CM | POA: Diagnosis not present

## 2024-08-27 DIAGNOSIS — H40033 Anatomical narrow angle, bilateral: Secondary | ICD-10-CM | POA: Diagnosis not present

## 2024-08-28 ENCOUNTER — Encounter (INDEPENDENT_AMBULATORY_CARE_PROVIDER_SITE_OTHER): Payer: Self-pay | Admitting: Ophthalmology

## 2024-08-28 ENCOUNTER — Ambulatory Visit (INDEPENDENT_AMBULATORY_CARE_PROVIDER_SITE_OTHER): Admitting: Ophthalmology

## 2024-08-28 DIAGNOSIS — H25813 Combined forms of age-related cataract, bilateral: Secondary | ICD-10-CM | POA: Diagnosis not present

## 2024-08-28 DIAGNOSIS — I10 Essential (primary) hypertension: Secondary | ICD-10-CM

## 2024-08-28 DIAGNOSIS — Z7985 Long-term (current) use of injectable non-insulin antidiabetic drugs: Secondary | ICD-10-CM

## 2024-08-28 DIAGNOSIS — Z794 Long term (current) use of insulin: Secondary | ICD-10-CM

## 2024-08-28 DIAGNOSIS — H35033 Hypertensive retinopathy, bilateral: Secondary | ICD-10-CM

## 2024-08-28 DIAGNOSIS — H43813 Vitreous degeneration, bilateral: Secondary | ICD-10-CM

## 2024-08-28 DIAGNOSIS — E119 Type 2 diabetes mellitus without complications: Secondary | ICD-10-CM

## 2024-08-28 DIAGNOSIS — H33311 Horseshoe tear of retina without detachment, right eye: Secondary | ICD-10-CM

## 2024-08-28 MED ORDER — PREDNISOLONE ACETATE 1 % OP SUSP: 1.0000 [drp] | Freq: Four times a day (QID) | OPHTHALMIC | 0 refills | Status: AC

## 2024-08-28 NOTE — Progress Notes (Signed)
 [facility] - Clinic Note  08/28/2024   CHIEF COMPLAINT Patient presents for Retina Evaluation  HISTORY OF PRESENT ILLNESS: Courtney Barry is a 73 y.o. female who presents to the clinic today for:  HPI     Retina Evaluation   In both eyes.  Associated Symptoms Floaters.  I, the attending physician,  performed the HPI with the patient and updated documentation appropriately.        Comments   Patient here for Retina Evaluation. Referred by Dr GORMAN Gaudy. Patient states vision not bad. Dr Gaudy saw a tear OD. Patient saw a large floater OS. The next day got smaller but still there. No eye pain. Ou itches. Uses refresh prn.      Last edited by Valdemar Rogue, MD on 08/28/2024  1:35 PM.     Pt states last Friday she saw a new floater in OS then saw a shooting start and light a few times. The floater in OS was large but has now gotten smaller, still present.   Referring physician: Gaudy Charlie Hamilton, MD [address] [place],  [place] [address]  HISTORICAL INFORMATION:  Selected notes from the MEDICAL RECORD NUMBER Referred by Dr. CANDIE Gaudy for HST OD LEE:  Ocular Hx- PMH-   CURRENT MEDICATIONS: Current Outpatient Medications (Ophthalmic Drugs)  Medication Sig   prednisoLONE acetate (PRED FORTE) 1 % ophthalmic suspension Place 1 drop into the right eye 4 (four) times daily for 7 days.   No current facility-administered medications for this visit. (Ophthalmic Drugs)   Current Outpatient Medications (Other)  Medication Sig   ACCU-CHEK GUIDE TEST test strip USE TO CHECK BLOOD GLUCOSE TWICE DAILY   Accu-Chek Softclix Lancets lancets USE AS INSTRUCTED TO MONITOR GLUCOSE TWICE DAILY   atorvastatin  (LIPITOR) 40 MG tablet Take 1 tablet (40 mg total) by mouth daily. (Patient taking differently: Take 20 mg by mouth daily.)   Blood Glucose Monitoring Suppl (ACCU-CHEK GUIDE ME) w/Device KIT Use as directed to check blood glucose twice daily before breakfast and  bed. DX E11.65   Cholecalciferol (VITAMIN D ) 125 MCG (5000 UT) CAPS Take 1 capsule by mouth daily.   Insulin  Pen Needle (BD PEN NEEDLE NANO U/F) 32G X 4 MM MISC 1 Units by Does not apply route daily at 2 PM.   loratadine (CLARITIN) 10 MG tablet Take 10 mg by mouth daily as needed for allergies, rhinitis or itching.   losartan  (COZAAR ) 25 MG tablet Take 1 tablet (25 mg total) by mouth daily.   Multiple Vitamin (MULTIVITAMIN) tablet Take 1 tablet by mouth daily.   Semaglutide , 2 MG/DOSE, (OZEMPIC , 2 MG/DOSE,) 8 MG/3ML SOPN Inject 2 mg into the skin once a week.   SYNTHROID  112 MCG tablet Take 1 tablet (112 mcg total) by mouth daily.   TRESIBA  FLEXTOUCH 100 UNIT/ML FlexTouch Pen Inject 28 Units into the skin at bedtime.   No current facility-administered medications for this visit. (Other)   REVIEW OF SYSTEMS: ROS   Positive for: Endocrine, Eyes Last edited by Orval Asberry GORMAN, COA on 08/28/2024  9:53 AM.     ALLERGIES Allergies  Allergen Reactions   Latex     Gloves, band aids- causes rash   Sulfa Antibiotics Rash   PAST MEDICAL HISTORY Past Medical History:  Diagnosis Date   Arthritis    generalized- RIGHT hip   Diabetes mellitus, type II (HCC)    Encounter for specialized medical examination 09/04/2013   Hyperlipidemia  on meds   Hypertension    on meds   Hypothyroidism    on meds   Menopausal vaginal dryness    Normal gynecologic examination 09/05/2014   Seasonal allergies    Vaccine counseling 05/21/2021   Past Surgical History:  Procedure Laterality Date   ABDOMINAL HYSTERECTOMY     APPENDECTOMY     CHOLECYSTECTOMY     COLONOSCOPY  2014   Dr.Richardson-hems/tics/abnormal rectal exam-good prep   COLONOSCOPY  06/2023   Dr. Lynnie Bring, diverticulosis and polpys, repeat 7 years   TONSILLECTOMY     FAMILY HISTORY Family History  Problem Relation Age of Onset   Colon polyps Mother 58   Thyroid  disease Mother    Cancer Mother    Pancreatic cancer Mother 5    Hypertension Father    Kidney disease Father    Thyroid  cancer Sister        died at 30   Colon cancer Neg Hx    Esophageal cancer Neg Hx    Stomach cancer Neg Hx    Rectal cancer Neg Hx    SOCIAL HISTORY Social History   Tobacco Use   Smoking status: Never   Smokeless tobacco: Never  Vaping Use   Vaping status: Never Used  Substance Use Topics   Alcohol use: Never   Drug use: Never       OPHTHALMIC EXAM:  Base Eye Exam     Visual Acuity (Snellen - Linear)       Right Left   Dist cc 20/20 20/20 -1    Correction: Glasses         Tonometry (Tonopen, 9:48 AM)       Right Left   Pressure 21 19         Pupils       Pupils Dark Light Shape React APD   Right PERRL 3 2 Round Brisk None   Left PERRL 3 2 Round Brisk None         Visual Fields (Counting fingers)       Left Right    Full Full         Extraocular Movement       Right Left    Full, Ortho Full, Ortho         Neuro/Psych     Oriented x3: Yes   Mood/Affect: Normal         Dilation     Both eyes: 1.0% Mydriacyl, 2.5% Phenylephrine @ 9:48 AM           Slit Lamp and Fundus Exam     Slit Lamp Exam       Right Left   Lids/Lashes Dermatochalasis Dermatochalasis   Conjunctiva/Sclera mild melanosis mild melanosis   Cornea trace tear film debris trace PEE   Anterior Chamber moderate depth, narrow angles moderate depth, narrow angles   Iris Round and moderately dilated, no NVI, patent PI @ 900--partially closed Round and moderately dilated, no NVI, patent PI @ 430   Lens 2-3+ Nuclear sclerosis, 2-3+ Cortical cataract 2-3+ Nuclear sclerosis, 2-3+ Cortical cataract   Anterior Vitreous mild syneresis, Posterior vitreous detachment mild syneresis, Posterior vitreous detachment         Fundus Exam       Right Left   Disc Pink and Sharp, mild PPP Pink and Sharp   C/D Ratio 0.2 0.3   Macula Flat, Good foveal reflex, mild RPE mottling, No heme or edema Flat, Good foveal  reflex, mild RPE mottling,  No heme or edema   Vessels Attenuated, mild copper wiring Attenuated, mild copper wiring   Periphery Attached, small HST at 1030 w/ small cuff of SRF Attached, no heme, no RT/RD           Refraction     Wearing Rx       Sphere Cylinder Axis Add   Right +0.00 +1.25 105 +2.25   Left +0.75 +0.75 039 +2.25           IMAGING AND PROCEDURES  Imaging and Procedures for 08/28/2024  OCT, Retina - OU - Both Eyes       Right Eye Quality was good. Central Foveal Thickness: 222. Progression has no prior data. Findings include normal foveal contour, no IRF, no SRF.   Left Eye Quality was good. Central Foveal Thickness: 223. Progression has no prior data. Findings include normal foveal contour, no IRF, no SRF.   Notes *Images captured and stored on drive  Diagnosis / Impression:  NFP, no IRF/SRF OU  Clinical management:  See below  Abbreviations: NFP - Normal foveal profile. CME - cystoid macular edema. PED - pigment epithelial detachment. IRF - intraretinal fluid. SRF - subretinal fluid. EZ - ellipsoid zone. ERM - epiretinal membrane. ORA - outer retinal atrophy. ORT - outer retinal tubulation. SRHM - subretinal hyper-reflective material. IRHM - intraretinal hyper-reflective material      Repair Retinal Breaks, Laser - OD - Right Eye       LASER PROCEDURE NOTE  Procedure:  Barrier laser retinopexy using slit lamp laser, RIGHT eye   Diagnosis:   Retinal tear, RIGHT eye                     Small flap tear at 1030 o'clock anterior to equator   Surgeon: Redell Hans, MD, PhD  Anesthesia: Topical  Informed consent obtained, operative eye marked, and time out performed prior to initiation of laser.   Laser settings:  Lumenis [id number] laser, slit lamp Lens: Mainster PRP 165 Power: 250 mW Spot size: 500 microns Duration: 30 msec  # spots: 308  Placement of laser: Using a Mainster PRP 165 contact lens at the slit lamp, laser was placed in  three+ confluent rows around flap tear at 1030 oclock anterior to equator  Complications: None.  Patient tolerated the procedure well and received written and verbal post-procedure care information/education.           ASSESSMENT/PLAN:   ICD-10-CM   1. Retinal tear of right eye  H33.311 OCT, Retina - OU - Both Eyes    Repair Retinal Breaks, Laser - OD - Right Eye    2. Posterior vitreous detachment of both eyes  H43.813     3. Essential hypertension  I10     4. Hypertensive retinopathy of both eyes  H35.033 OCT, Retina - OU - Both Eyes    5. Diabetes mellitus type 2 without retinopathy (HCC)  E11.9 OCT, Retina - OU - Both Eyes    6. Current use of insulin  (HCC)  Z79.4     7. Diabetes mellitus treated with injections of non-insulin  medication (HCC)  E11.9    Z79.85     8. Combined forms of age-related cataract of both eyes  H25.813      Retinal tear, OD.   - The incidence, risk factors, and natural history of retinal tear was discussed with patient.   - Potential treatment options including laser retinopexy and cryotherapy discussed with patient. - small HST @  1030 w/ small cuff of SRF -- asymptomatic - recommend laser retinopexy OD today, 10.14.25 - pt wishes to proceed with laser OD - RBA of procedure discussed, questions answered - informed consent obtained and signed - see procedure note - start PF QID x7 days - f/u in 2wks, DFE, OCT  2. PVD OU/acute PVD OS  - Pt reported noticing of large floater OS last Friday (10.10.25) w/ the floater decreasing in size over time.   - Discussed findings and prognosis  - No RT or RD OS on 360 peripheral exam  - +retinal tear OD (see above)   - Reviewed s/s of RT/RD  - Strict return precautions for any such RT/RD signs/symptoms  - f/u as above  3,4. Hypertensive retinopathy OU - discussed importance of tight BP control - monitor   5-7. Diabetes mellitus, type 2 without retinopathy  - A1C 7.8 on 05.21.25 - The incidence,  risk factors for progression, natural history and treatment options for diabetic retinopathy  were discussed with patient.   - The need for close monitoring of blood glucose, blood pressure, and serum lipids, avoiding cigarette or any type of tobacco, and the need for long term follow up was also discussed with patient. - f/u in 1 year, sooner prn   8. Mixed Cataract OU - The symptoms of cataract, surgical options, and treatments and risks were discussed with patient. - discussed diagnosis and progression - monitor   Ophthalmic Meds Ordered this visit:  Meds ordered this encounter  Medications   prednisoLONE acetate (PRED FORTE) 1 % ophthalmic suspension    Sig: Place 1 drop into the right eye 4 (four) times daily for 7 days.    Dispense:  10 mL    Refill:  0     Return in about 2 weeks (around 09/11/2024) for s/p retinopexy HST OD, DFE, OCT.  There are no Patient Instructions on file for this visit.  Explained the diagnoses, plan, and follow up with the patient and they expressed understanding.  Patient expressed understanding of the importance of proper follow up care.   This document serves as a record of services personally performed by Redell JUDITHANN Hans, MD, PhD. It was created on their behalf by Almetta Pesa, an ophthalmic technician. The creation of this record is the provider's dictation and/or activities during the visit.    Electronically signed by: Almetta Pesa, OA, 08/28/24  8:56 PM  Redell JUDITHANN Hans, M.D., Ph.D. Diseases & Surgery of the Retina and Vitreous [facility] 08/28/2024  I have reviewed the above documentation for accuracy and completeness, and I agree with the above. Redell JUDITHANN Hans, M.D., Ph.D. 08/28/24 8:58 PM    Abbreviations: M myopia (nearsighted); A astigmatism; H hyperopia (farsighted); P presbyopia; Mrx spectacle prescription;  CTL contact lenses; OD right eye; OS left eye; OU both eyes  XT exotropia; ET esotropia; PEK  punctate epithelial keratitis; PEE punctate epithelial erosions; DES dry eye syndrome; MGD meibomian gland dysfunction; ATs artificial tears; PFAT's preservative free artificial tears; NSC nuclear sclerotic cataract; PSC posterior subcapsular cataract; ERM epi-retinal membrane; PVD posterior vitreous detachment; RD retinal detachment; DM diabetes mellitus; DR diabetic retinopathy; NPDR non-proliferative diabetic retinopathy; PDR proliferative diabetic retinopathy; CSME clinically significant macular edema; DME diabetic macular edema; dbh dot blot hemorrhages; CWS cotton wool spot; POAG primary open angle glaucoma; C/D cup-to-disc ratio; HVF humphrey visual field; GVF goldmann visual field; OCT optical coherence tomography; IOP intraocular pressure; BRVO Branch retinal vein occlusion; CRVO central retinal  vein occlusion; CRAO central retinal artery occlusion; BRAO branch retinal artery occlusion; RT retinal tear; SB scleral buckle; PPV pars plana vitrectomy; VH Vitreous hemorrhage; PRP panretinal laser photocoagulation; IVK intravitreal kenalog ; VMT vitreomacular traction; MH Macular hole;  NVD neovascularization of the disc; NVE neovascularization elsewhere; AREDS age related eye disease study; ARMD age related macular degeneration; POAG primary open angle glaucoma; EBMD epithelial/anterior basement membrane dystrophy; ACIOL anterior chamber intraocular lens; IOL intraocular lens; PCIOL posterior chamber intraocular lens; Phaco/IOL phacoemulsification with intraocular lens placement; PRK photorefractive keratectomy; LASIK laser assisted in situ keratomileusis; HTN hypertension; DM diabetes mellitus; COPD chronic obstructive pulmonary disease

## 2024-08-30 NOTE — Progress Notes (Signed)
 [facility] - Clinic Note  09/11/2024   CHIEF COMPLAINT Patient presents for Retina Follow Up  HISTORY OF PRESENT ILLNESS: Courtney Barry is a 73 y.o. female who presents to the clinic today for:  HPI     Retina Follow Up   Patient presents with  Other.  In right eye.  This started 2.  Duration of 2 weeks.  Since onset it is stable.  I, the attending physician,  performed the HPI with the patient and updated documentation appropriately.        Comments   2 week s/p tear pexy OD pt is reporting no vision changes noticed she denies flashes has floaters in OS last reading 134 this am       Last edited by Valdemar Rogue, MD on 09/16/2024  2:28 AM.    Pt states no vision changes, did well after laser OD. Some floaters in OS but no FOL. Completed PF gtts OD after 7 days.   Referring physician: Octavia Charlie Hamilton, MD [address] [place],  [place] [address]  HISTORICAL INFORMATION:  Selected notes from the MEDICAL RECORD NUMBER Referred by Dr. CANDIE Octavia for HST OD LEE:  Ocular Hx- PMH-   CURRENT MEDICATIONS: No current outpatient medications on file. (Ophthalmic Drugs)   No current facility-administered medications for this visit. (Ophthalmic Drugs)   Current Outpatient Medications (Other)  Medication Sig   ACCU-CHEK GUIDE TEST test strip USE TO CHECK BLOOD GLUCOSE TWICE DAILY   Accu-Chek Softclix Lancets lancets USE AS INSTRUCTED TO MONITOR GLUCOSE TWICE DAILY   atorvastatin  (LIPITOR) 40 MG tablet Take 1 tablet (40 mg total) by mouth daily. (Patient taking differently: Take 20 mg by mouth daily.)   Blood Glucose Monitoring Suppl (ACCU-CHEK GUIDE ME) w/Device KIT Use as directed to check blood glucose twice daily before breakfast and bed. DX E11.65   Cholecalciferol (VITAMIN D ) 125 MCG (5000 UT) CAPS Take 1 capsule by mouth daily.   Insulin  Pen Needle (BD PEN NEEDLE NANO U/F) 32G X 4 MM MISC 1 Units by Does not apply route daily at 2 PM.    loratadine (CLARITIN) 10 MG tablet Take 10 mg by mouth daily as needed for allergies, rhinitis or itching.   losartan  (COZAAR ) 25 MG tablet Take 1 tablet (25 mg total) by mouth daily.   Multiple Vitamin (MULTIVITAMIN) tablet Take 1 tablet by mouth daily.   Semaglutide , 2 MG/DOSE, (OZEMPIC , 2 MG/DOSE,) 8 MG/3ML SOPN Inject 2 mg into the skin once a week.   SYNTHROID  112 MCG tablet Take 1 tablet (112 mcg total) by mouth daily.   TRESIBA  FLEXTOUCH 100 UNIT/ML FlexTouch Pen Inject 28 Units into the skin at bedtime.   No current facility-administered medications for this visit. (Other)   REVIEW OF SYSTEMS: ROS   Positive for: Endocrine, Eyes Last edited by Resa Delon ORN, COT on 09/11/2024  9:19 AM.      ALLERGIES Allergies  Allergen Reactions   Latex     Gloves, band aids- causes rash   Sulfa Antibiotics Rash   PAST MEDICAL HISTORY Past Medical History:  Diagnosis Date   Arthritis    generalized- RIGHT hip   Diabetes mellitus, type II (HCC)    Encounter for specialized medical examination 09/04/2013   Hyperlipidemia    on meds   Hypertension    on meds   Hypothyroidism    on meds   Menopausal vaginal dryness    Normal gynecologic examination 09/05/2014  Seasonal allergies    Vaccine counseling 05/21/2021   Past Surgical History:  Procedure Laterality Date   ABDOMINAL HYSTERECTOMY     APPENDECTOMY     CHOLECYSTECTOMY     COLONOSCOPY  2014   Dr.Richardson-hems/tics/abnormal rectal exam-good prep   COLONOSCOPY  06/2023   Dr. Lynnie Bring, diverticulosis and polpys, repeat 7 years   TONSILLECTOMY     FAMILY HISTORY Family History  Problem Relation Age of Onset   Colon polyps Mother 45   Thyroid  disease Mother    Cancer Mother    Pancreatic cancer Mother 74   Hypertension Father    Kidney disease Father    Thyroid  cancer Sister        died at 55   Colon cancer Neg Hx    Esophageal cancer Neg Hx    Stomach cancer Neg Hx    Rectal cancer Neg Hx     SOCIAL HISTORY Social History   Tobacco Use   Smoking status: Never   Smokeless tobacco: Never  Vaping Use   Vaping status: Never Used  Substance Use Topics   Alcohol use: Never   Drug use: Never       OPHTHALMIC EXAM:  Base Eye Exam     Visual Acuity (Snellen - Linear)       Right Left   Dist [place] 20/25 -1 20/25 -2   Dist ph [place] 20/20 20/20 -2         Tonometry (Tonopen, 9:25 AM)       Right Left   Pressure 13 14         Pupils       Pupils Dark Light Shape React APD   Right PERRL 3 2 Round Brisk None   Left PERRL 3 2 Round Brisk None         Visual Fields       Left Right    Full Full         Extraocular Movement       Right Left    Full, Ortho Full, Ortho         Neuro/Psych     Oriented x3: Yes   Mood/Affect: Normal         Dilation     Right eye:            Slit Lamp and Fundus Exam     Slit Lamp Exam       Right Left   Lids/Lashes Dermatochalasis Dermatochalasis   Conjunctiva/Sclera mild melanosis mild melanosis   Cornea trace tear film debris trace PEE   Anterior Chamber moderate depth, narrow angles moderate depth, narrow angles   Iris Round and moderately dilated, no NVI, patent PI @ 900--partially closed Round and reactive, no NVI, patent PI @ 430   Lens 2-3+ Nuclear sclerosis, 2-3+ Cortical cataract 2-3+ Nuclear sclerosis, 2-3+ Cortical cataract   Anterior Vitreous mild syneresis, Posterior vitreous detachment mild syneresis, Posterior vitreous detachment         Fundus Exam       Right Left   Disc Pink and Sharp, mild PPP Pink and Sharp   C/D Ratio 0.2 0.3   Macula Flat, Good foveal reflex, mild RPE mottling, No heme or edema Flat, Good foveal reflex, mild RPE mottling, No heme or edema   Vessels Attenuated, mild copper wiring Attenuated, mild copper wiring   Periphery Attached, small HST at 1030 w/ small cuff of SRF--now w/ mild laser changes surrounding, not fully mature. Attached, no  heme, no RT/RD            Refraction     Wearing Rx       Sphere Cylinder Axis Add   Right +0.00 +1.25 105 +2.25   Left +0.75 +0.75 039 +2.25           IMAGING AND PROCEDURES  Imaging and Procedures for 09/11/2024  OCT, Retina - OU - Both Eyes       Right Eye Quality was good. Central Foveal Thickness: 229. Progression has been stable. Findings include normal foveal contour, no IRF, no SRF.   Left Eye Quality was good. Central Foveal Thickness: 228. Progression has been stable. Findings include normal foveal contour, no IRF, no SRF.   Notes *Images captured and stored on drive  Diagnosis / Impression:  NFP, no IRF/SRF OU  Clinical management:  See below  Abbreviations: NFP - Normal foveal profile. CME - cystoid macular edema. PED - pigment epithelial detachment. IRF - intraretinal fluid. SRF - subretinal fluid. EZ - ellipsoid zone. ERM - epiretinal membrane. ORA - outer retinal atrophy. ORT - outer retinal tubulation. SRHM - subretinal hyper-reflective material. IRHM - intraretinal hyper-reflective material            ASSESSMENT/PLAN:   ICD-10-CM   1. Retinal tear of right eye  H33.311 OCT, Retina - OU - Both Eyes    2. Posterior vitreous detachment of both eyes  H43.813     3. Essential hypertension  I10     4. Hypertensive retinopathy of both eyes  H35.033     5. Diabetes mellitus type 2 without retinopathy (HCC)  E11.9     6. Current use of insulin  (HCC)  Z79.4     7. Diabetes mellitus treated with injections of non-insulin  medication (HCC)  E11.9    Z79.85     8. Combined forms of age-related cataract of both eyes  H25.813       Retinal tear, OD.   - small HST @ 1030 w/ small cuff of SRF -- asymptomatic - s/p laser retinopexy OD 10.14.25 -- mild laser changes in place -- not yet fully mature - completed PF QID x7 days - f/u in 3 weeks, DFE, OCT  2. PVD OU/acute PVD OS  - Pt reported noticing of large floater OS last Friday (10.10.25) w/ the floater  decreasing in size over time.   - Discussed findings and prognosis  - No RT or RD OS on 360 peripheral exam  - +retinal tear OD (see above)   - Reviewed s/s of RT/RD  - Strict return precautions for any such RT/RD signs/symptoms  - f/u as above  3,4. Hypertensive retinopathy OU - discussed importance of tight BP control - monitor   5-7. Diabetes mellitus, type 2 without retinopathy  - A1C 7.8 on 05.21.25 - The incidence, risk factors for progression, natural history and treatment options for diabetic retinopathy  were discussed with patient.   - The need for close monitoring of blood glucose, blood pressure, and serum lipids, avoiding cigarette or any type of tobacco, and the need for long term follow up was also discussed with patient. - f/u in 1 year, sooner prn   8. Mixed Cataract OU - The symptoms of cataract, surgical options, and treatments and risks were discussed with patient. - discussed diagnosis and progression - monitor   Ophthalmic Meds Ordered this visit:  No orders of the defined types were placed in this encounter.    Return in 3 weeks (  on 10/02/2024) for RT OD, DFE, OCT.  There are no Patient Instructions on file for this visit.  Explained the diagnoses, plan, and follow up with the patient and they expressed understanding.  Patient expressed understanding of the importance of proper follow up care.   This document serves as a record of services personally performed by Redell JUDITHANN Hans, MD, PhD. It was created on their behalf by Wanda GEANNIE Keens, COT an ophthalmic technician. The creation of this record is the provider's dictation and/or activities during the visit.    Electronically signed by:  Wanda GEANNIE Keens, COT  09/16/24 2:29 AM  This document serves as a record of services personally performed by Redell JUDITHANN Hans, MD, PhD. It was created on their behalf by Almetta Pesa, an ophthalmic technician. The creation of this record is the provider's  dictation and/or activities during the visit.    Electronically signed by: Almetta Pesa, OA, 09/16/24  2:29 AM  Redell JUDITHANN Hans, M.D., Ph.D. Diseases & Surgery of the Retina and Vitreous [facility] : [facility] 09/11/2024  I have reviewed the above documentation for accuracy and completeness, and I agree with the above. Redell JUDITHANN Hans, M.D., Ph.D. 09/16/24 2:30 AM   Abbreviations: M myopia (nearsighted); A astigmatism; H hyperopia (farsighted); P presbyopia; Mrx spectacle prescription;  CTL contact lenses; OD right eye; OS left eye; OU both eyes  XT exotropia; ET esotropia; PEK punctate epithelial keratitis; PEE punctate epithelial erosions; DES dry eye syndrome; MGD meibomian gland dysfunction; ATs artificial tears; PFAT's preservative free artificial tears; NSC nuclear sclerotic cataract; PSC posterior subcapsular cataract; ERM epi-retinal membrane; PVD posterior vitreous detachment; RD retinal detachment; DM diabetes mellitus; DR diabetic retinopathy; NPDR non-proliferative diabetic retinopathy; PDR proliferative diabetic retinopathy; CSME clinically significant macular edema; DME diabetic macular edema; dbh dot blot hemorrhages; CWS cotton wool spot; POAG primary open angle glaucoma; C/D cup-to-disc ratio; HVF humphrey visual field; GVF goldmann visual field; OCT optical coherence tomography; IOP intraocular pressure; BRVO Branch retinal vein occlusion; CRVO central retinal vein occlusion; CRAO central retinal artery occlusion; BRAO branch retinal artery occlusion; RT retinal tear; SB scleral buckle; PPV pars plana vitrectomy; VH Vitreous hemorrhage; PRP panretinal laser photocoagulation; IVK intravitreal kenalog ; VMT vitreomacular traction; MH Macular hole;  NVD neovascularization of the disc; NVE neovascularization elsewhere; AREDS age related eye disease study; ARMD age related macular degeneration; POAG primary open angle glaucoma; EBMD epithelial/anterior basement membrane  dystrophy; ACIOL anterior chamber intraocular lens; IOL intraocular lens; PCIOL posterior chamber intraocular lens; Phaco/IOL phacoemulsification with intraocular lens placement; PRK photorefractive keratectomy; LASIK laser assisted in situ keratomileusis; HTN hypertension; DM diabetes mellitus; COPD chronic obstructive pulmonary disease

## 2024-09-11 ENCOUNTER — Ambulatory Visit (INDEPENDENT_AMBULATORY_CARE_PROVIDER_SITE_OTHER): Admitting: Ophthalmology

## 2024-09-11 ENCOUNTER — Encounter (INDEPENDENT_AMBULATORY_CARE_PROVIDER_SITE_OTHER): Payer: Self-pay | Admitting: Ophthalmology

## 2024-09-11 DIAGNOSIS — H43813 Vitreous degeneration, bilateral: Secondary | ICD-10-CM

## 2024-09-11 DIAGNOSIS — Z794 Long term (current) use of insulin: Secondary | ICD-10-CM | POA: Diagnosis not present

## 2024-09-11 DIAGNOSIS — H35033 Hypertensive retinopathy, bilateral: Secondary | ICD-10-CM

## 2024-09-11 DIAGNOSIS — I10 Essential (primary) hypertension: Secondary | ICD-10-CM

## 2024-09-11 DIAGNOSIS — E119 Type 2 diabetes mellitus without complications: Secondary | ICD-10-CM

## 2024-09-11 DIAGNOSIS — H25813 Combined forms of age-related cataract, bilateral: Secondary | ICD-10-CM | POA: Diagnosis not present

## 2024-09-11 DIAGNOSIS — Z7985 Long-term (current) use of injectable non-insulin antidiabetic drugs: Secondary | ICD-10-CM

## 2024-09-11 DIAGNOSIS — H33311 Horseshoe tear of retina without detachment, right eye: Secondary | ICD-10-CM | POA: Diagnosis not present

## 2024-09-13 ENCOUNTER — Ambulatory Visit (INDEPENDENT_AMBULATORY_CARE_PROVIDER_SITE_OTHER): Payer: Self-pay

## 2024-09-13 ENCOUNTER — Ambulatory Visit: Attending: INTERNAL MEDICINE

## 2024-09-13 ENCOUNTER — Other Ambulatory Visit: Payer: Self-pay

## 2024-09-13 DIAGNOSIS — Z803 Family history of malignant neoplasm of breast: Secondary | ICD-10-CM | POA: Insufficient documentation

## 2024-09-13 DIAGNOSIS — E039 Hypothyroidism, unspecified: Secondary | ICD-10-CM | POA: Insufficient documentation

## 2024-09-13 DIAGNOSIS — D563 Thalassemia minor: Secondary | ICD-10-CM | POA: Insufficient documentation

## 2024-09-13 DIAGNOSIS — E559 Vitamin D deficiency, unspecified: Secondary | ICD-10-CM | POA: Insufficient documentation

## 2024-09-13 DIAGNOSIS — M810 Age-related osteoporosis without current pathological fracture: Secondary | ICD-10-CM | POA: Insufficient documentation

## 2024-09-13 LAB — CBC WITH DIFF
BASOPHIL #: 0 x10ˆ3/uL (ref 0.00–0.20)
BASOPHIL %: 0.9 % (ref 0.0–1.0)
EOSINOPHIL #: 0.2 x10ˆ3/uL (ref 0.00–0.70)
EOSINOPHIL %: 3.4 % (ref 0.0–5.0)
HCT: 40.2 % (ref 37.0–47.0)
HGB: 12.8 g/dL (ref 12.0–16.0)
LYMPHOCYTE #: 1.3 x10ˆ3/uL (ref 1.20–3.40)
LYMPHOCYTE %: 27.5 % (ref 20.0–50.0)
MCH: 21.4 pg — ABNORMAL LOW (ref 27.0–33.0)
MCHC: 31.7 g/dL — ABNORMAL LOW (ref 33.0–37.0)
MCV: 67.5 fL — ABNORMAL LOW (ref 81.0–99.0)
MONOCYTE #: 0.3 x10ˆ3/uL (ref 0.11–0.59)
MONOCYTE %: 5.9 % (ref 2.0–11.0)
MPV: 7.7 fL (ref 6.5–10.3)
NEUTROPHIL #: 3.1 x10ˆ3/uL (ref 1.40–6.50)
NEUTROPHIL %: 62.3 % (ref 40.0–70.0)
PLATELETS: 238 x10ˆ3/uL (ref 150–400)
RBC: 5.96 x10ˆ6/uL — ABNORMAL HIGH (ref 4.00–5.20)
RDW: 16.5 % — ABNORMAL HIGH (ref 11.5–14.5)
WBC: 4.9 x10ˆ3/uL (ref 4.8–10.8)

## 2024-09-13 LAB — LIPID PANEL
CHOL/HDL RATIO: 4.3 (ref <=5.0)
CHOLESTEROL: 217 mg/dL — ABNORMAL HIGH (ref 0–199)
HDL CHOL: 50 mg/dL (ref 40–60)
LDL CALC: 138 mg/dL — ABNORMAL HIGH (ref 0–99)
LDL/HDL RATIO: 2.8
TRIGLYCERIDES: 144 mg/dL (ref 0–150)

## 2024-09-13 LAB — IRON TRANSFERRIN AND TIBC
IRON (TRANSFERRIN) SATURATION: 37 % (ref 20–55)
IRON: 117 ug/dL (ref 50–150)
TOTAL IRON BINDING CAPACITY: 313 ug/dL (ref 260–445)

## 2024-09-13 LAB — THYROID STIMULATING HORMONE (SENSITIVE TSH): TSH: 0.866 u[IU]/mL (ref 0.350–4.940)

## 2024-09-13 LAB — COMPREHENSIVE METABOLIC PANEL, NON-FASTING
ALBUMIN: 4.1 g/dL (ref 3.5–4.8)
ALKALINE PHOSPHATASE: 89 U/L (ref 45–117)
ALT (SGPT): 13 U/L (ref 10–49)
ANION GAP: 7 mmol/L (ref 3–16)
AST (SGOT): 21 U/L (ref 12–45)
BILIRUBIN TOTAL: 0.6 mg/dL (ref <=1.0)
BUN/CREA RATIO: 15 (ref 8–30)
BUN: 13 mg/dL (ref <=22)
CALCIUM: 9.6 mg/dL (ref 8.5–10.5)
CHLORIDE: 106 mmol/L (ref 96–107)
CO2 TOTAL: 28 mmol/L (ref 23–35)
CREATININE: 0.87 mg/dL (ref 0.55–1.02)
ESTIMATED GFR: >60 mL/min/1.73mˆ2 (ref >59)
GLOBULIN: 3.4 g/dL (ref 2.3–3.5)
GLUCOSE: 98 mg/dL (ref 70–100)
POTASSIUM: 4.1 mmol/L (ref 3.5–5.0)
PROTEIN TOTAL: 7.5 g/dL (ref 6.4–8.2)
SODIUM: 141 mmol/L (ref 135–145)

## 2024-09-13 LAB — SCAN DIFFERENTIAL
PLATELET MORPHOLOGY COMMENT: NORMAL
WBC MORPHOLOGY COMMENT: NORMAL

## 2024-09-13 LAB — VITAMIN D 25 TOTAL: VITAMIN D, 25OH: 71 ng/mL (ref 30–100)

## 2024-09-13 LAB — FERRITIN: FERRITIN: 207 ng/mL (ref 8–252)

## 2024-09-13 LAB — THYROID STIMULATING HORMONE WITH FREE T4 REFLEX: TSH: 0.866 u[IU]/mL (ref 0.350–4.940)

## 2024-09-13 LAB — THYROXINE, FREE (FREE T4): THYROXINE (T4), FREE: 1.3 ng/dL (ref 0.76–1.46)

## 2024-09-16 ENCOUNTER — Encounter (INDEPENDENT_AMBULATORY_CARE_PROVIDER_SITE_OTHER): Payer: Self-pay | Admitting: Ophthalmology

## 2024-09-17 ENCOUNTER — Encounter: Payer: Self-pay | Admitting: Radiology

## 2024-09-18 NOTE — Progress Notes (Signed)
 [facility] - Clinic Note  10/02/2024   CHIEF COMPLAINT Patient presents for Retina Follow Up  HISTORY OF PRESENT ILLNESS: Courtney Barry is a 73 y.o. female who presents to the clinic today for:  HPI     Retina Follow Up   Patient presents with  Other.  In right eye.  This started 3 weeks ago.  I, the attending physician,  performed the HPI with the patient and updated documentation appropriately.        Comments   Patient here for 3 weeks retina follow up for retinal rear OD. Patient states vision doing pretty good. No eye pain.       Last edited by Valdemar Rogue, MD on 10/02/2024 12:24 PM.    Pt states   Referring physician: Octavia Charlie Hamilton, MD [address] [place],  [place] [address]  HISTORICAL INFORMATION:  Selected notes from the MEDICAL RECORD NUMBER Referred by Dr. CANDIE Octavia for HST OD LEE:  Ocular Hx- PMH-   CURRENT MEDICATIONS: No current outpatient medications on file. (Ophthalmic Drugs)   No current facility-administered medications for this visit. (Ophthalmic Drugs)   Current Outpatient Medications (Other)  Medication Sig   ACCU-CHEK GUIDE TEST test strip USE TO CHECK BLOOD GLUCOSE TWICE DAILY   Accu-Chek Softclix Lancets lancets USE AS INSTRUCTED TO MONITOR GLUCOSE TWICE DAILY   atorvastatin  (LIPITOR) 40 MG tablet Take 1 tablet (40 mg total) by mouth daily.   Blood Glucose Monitoring Suppl (ACCU-CHEK GUIDE ME) w/Device KIT Use as directed to check blood glucose twice daily before breakfast and bed. DX E11.65   Cholecalciferol (VITAMIN D ) 125 MCG (5000 UT) CAPS Take 1 capsule by mouth daily.   Insulin  Pen Needle (BD PEN NEEDLE NANO U/F) 32G X 4 MM MISC 1 Units by Does not apply route daily at 2 PM.   loratadine (CLARITIN) 10 MG tablet Take 10 mg by mouth daily as needed for allergies, rhinitis or itching.   losartan  (COZAAR ) 25 MG tablet Take 1 tablet (25 mg total) by mouth daily.   Multiple Vitamin (MULTIVITAMIN) tablet  Take 1 tablet by mouth daily.   Semaglutide , 2 MG/DOSE, (OZEMPIC , 2 MG/DOSE,) 8 MG/3ML SOPN Inject 2 mg into the skin once a week.   SYNTHROID  112 MCG tablet Take 1 tablet (112 mcg total) by mouth daily.   TRESIBA  FLEXTOUCH 100 UNIT/ML FlexTouch Pen Inject 28 Units into the skin at bedtime.   triamcinolone  cream (KENALOG ) 0.1 % Apply 1 Application topically 2 (two) times daily.   No current facility-administered medications for this visit. (Other)   REVIEW OF SYSTEMS: ROS   Positive for: Endocrine, Eyes Last edited by Orval Asberry RAMAN, COA on 10/02/2024  9:47 AM.       ALLERGIES Allergies  Allergen Reactions   Latex     Gloves, band aids- causes rash   Sulfa Antibiotics Rash   PAST MEDICAL HISTORY Past Medical History:  Diagnosis Date   Arthritis    generalized- RIGHT hip   Diabetes mellitus, type II (HCC)    Encounter for specialized medical examination 09/04/2013   Hyperlipidemia    on meds   Hypertension    on meds   Hypothyroidism    on meds   Menopausal vaginal dryness    Normal gynecologic examination 09/05/2014   Seasonal allergies    Vaccine counseling 05/21/2021   Past Surgical History:  Procedure Laterality Date   ABDOMINAL HYSTERECTOMY     APPENDECTOMY  CHOLECYSTECTOMY     COLONOSCOPY  2014   Dr.Richardson-hems/tics/abnormal rectal exam-good prep   COLONOSCOPY  06/2023   Dr. Lynnie Bring, diverticulosis and polpys, repeat 7 years   TONSILLECTOMY     FAMILY HISTORY Family History  Problem Relation Age of Onset   Colon polyps Mother 42   Thyroid  disease Mother    Cancer Mother    Pancreatic cancer Mother 31   Hypertension Father    Kidney disease Father    Thyroid  cancer Sister        died at 40   Colon cancer Neg Hx    Esophageal cancer Neg Hx    Stomach cancer Neg Hx    Rectal cancer Neg Hx    SOCIAL HISTORY Social History   Tobacco Use   Smoking status: Never   Smokeless tobacco: Never  Vaping Use   Vaping status: Never Used   Substance Use Topics   Alcohol use: Never   Drug use: Never       OPHTHALMIC EXAM:  Base Eye Exam     Visual Acuity (Snellen - Linear)       Right Left   Dist [place] 20/20 20/20 -1    Correction: Glasses         Tonometry (Tonopen, 9:45 AM)       Right Left   Pressure 20 16         Pupils       Dark Light Shape React APD   Right 3 2 Round Brisk None   Left 3 2 Round Brisk None         Visual Fields (Counting fingers)       Left Right    Full Full         Extraocular Movement       Right Left    Full, Ortho Full, Ortho         Neuro/Psych     Oriented x3: Yes   Mood/Affect: Normal         Dilation     Both eyes: 1.0% Mydriacyl, 2.5% Phenylephrine @ 9:45 AM           Slit Lamp and Fundus Exam     Slit Lamp Exam       Right Left   Lids/Lashes Dermatochalasis Dermatochalasis   Conjunctiva/Sclera mild melanosis mild melanosis   Cornea trace tear film debris trace PEE   Anterior Chamber moderate depth, narrow angles moderate depth, narrow angles   Iris Round and moderately dilated, no NVI, patent PI @ 900--partially closed Round and reactive, no NVI, patent PI @ 430   Lens 2-3+ Nuclear sclerosis, 2-3+ Cortical cataract 2-3+ Nuclear sclerosis, 2-3+ Cortical cataract   Anterior Vitreous mild syneresis, Posterior vitreous detachment mild syneresis, Posterior vitreous detachment         Fundus Exam       Right Left   Disc Pink and Sharp, mild PPP Pink and Sharp   C/D Ratio 0.2 0.3   Macula Flat, Good foveal reflex, mild RPE mottling, No heme or edema Flat, Good foveal reflex, mild RPE mottling, No heme or edema   Vessels Attenuated, mild copper wiring Attenuated, mild copper wiring   Periphery Attached, small HST at 1030 w/ small cuff of SRF--good laser changes surrounding Attached, no heme, no RT/RD           Refraction     Wearing Rx       Sphere Cylinder Axis Add   Right +0.00 +  1.25 105 +2.25   Left +0.75 +0.75 039 +2.25            IMAGING AND PROCEDURES  Imaging and Procedures for 10/02/2024  OCT, Retina - OU - Both Eyes       Right Eye Quality was good. Central Foveal Thickness: 227. Progression has been stable. Findings include normal foveal contour, no IRF, no SRF.   Left Eye Quality was good. Central Foveal Thickness: 225. Progression has been stable. Findings include normal foveal contour, no IRF, no SRF.   Notes *Images captured and stored on drive  Diagnosis / Impression:  NFP, no IRF/SRF OU  Clinical management:  See below  Abbreviations: NFP - Normal foveal profile. CME - cystoid macular edema. PED - pigment epithelial detachment. IRF - intraretinal fluid. SRF - subretinal fluid. EZ - ellipsoid zone. ERM - epiretinal membrane. ORA - outer retinal atrophy. ORT - outer retinal tubulation. SRHM - subretinal hyper-reflective material. IRHM - intraretinal hyper-reflective material             ASSESSMENT/PLAN:   ICD-10-CM   1. Retinal tear of right eye  H33.311 OCT, Retina - OU - Both Eyes    2. Posterior vitreous detachment of both eyes  H43.813     3. Essential hypertension  I10     4. Hypertensive retinopathy of both eyes  H35.033     5. Diabetes mellitus type 2 without retinopathy (HCC)  E11.9     6. Current use of insulin  (HCC)  Z79.4     7. Diabetes mellitus treated with injections of non-insulin  medication (HCC)  E11.9    Z79.85     8. Combined forms of age-related cataract of both eyes  H25.813      Retinal tear, OD.   - small HST @ 1030 w/ small cuff of SRF -- asymptomatic - s/p laser retinopexy OD 10.14.25 - good laser changes in place - f/u in 3-4 mos, DFE, OCT  2. PVD OU/acute PVD OS  - Pt reported noticing of large floater OS last Friday (10.10.25) w/ the floater decreasing in size over time.   - Discussed findings and prognosis  - No RT or RD OS on 360 peripheral exam  - +retinal tear OD (see above)   - Reviewed s/s of RT/RD  - Strict return  precautions for any such RT/RD signs/symptoms  - f/u as above  3,4. Hypertensive retinopathy OU - discussed importance of tight BP control - monitor   5-7. Diabetes mellitus, type 2 without retinopathy  - A1C 7.6 on 09.23.25, 7.8 on 05.21.25 - The incidence, risk factors for progression, natural history and treatment options for diabetic retinopathy  were discussed with patient.   - The need for close monitoring of blood glucose, blood pressure, and serum lipids, avoiding cigarette or any type of tobacco, and the need for long term follow up was also discussed with patient. - f/u in 1 year, sooner prn   8. Mixed Cataract OU - The symptoms of cataract, surgical options, and treatments and risks were discussed with patient. - discussed diagnosis and progression - Pt is clear from a retina standpoint to proceed w/ surgery when patient and surgeon (Dr. Octavia) agree--will refer pt back over for cataract eval OU  Ophthalmic Meds Ordered this visit:  No orders of the defined types were placed in this encounter.    Return for 3-4 months, HST OD, DFE, OCT.  There are no Patient Instructions on file for this visit.  Explained the  diagnoses, plan, and follow up with the patient and they expressed understanding.  Patient expressed understanding of the importance of proper follow up care.   This document serves as a record of services personally performed by Redell JUDITHANN Hans, MD, PhD. It was created on their behalf by Wanda GEANNIE Keens, COT an ophthalmic technician. The creation of this record is the provider's dictation and/or activities during the visit.    Electronically signed by:  Wanda GEANNIE Keens, COT  10/02/24 12:24 PM  This document serves as a record of services personally performed by Redell JUDITHANN Hans, MD, PhD. It was created on their behalf by Almetta Pesa, an ophthalmic technician. The creation of this record is the provider's dictation and/or activities during the visit.     Electronically signed by: Almetta Pesa, OA, 10/02/24  12:24 PM  Redell JUDITHANN Hans, M.D., Ph.D. Diseases & Surgery of the Retina and Vitreous [facility] 10/02/2024  I have reviewed the above documentation for accuracy and completeness, and I agree with the above. Redell JUDITHANN Hans, M.D., Ph.D. 10/02/24 12:36 PM   Abbreviations: M myopia (nearsighted); A astigmatism; H hyperopia (farsighted); P presbyopia; Mrx spectacle prescription;  CTL contact lenses; OD right eye; OS left eye; OU both eyes  XT exotropia; ET esotropia; PEK punctate epithelial keratitis; PEE punctate epithelial erosions; DES dry eye syndrome; MGD meibomian gland dysfunction; ATs artificial tears; PFAT's preservative free artificial tears; NSC nuclear sclerotic cataract; PSC posterior subcapsular cataract; ERM epi-retinal membrane; PVD posterior vitreous detachment; RD retinal detachment; DM diabetes mellitus; DR diabetic retinopathy; NPDR non-proliferative diabetic retinopathy; PDR proliferative diabetic retinopathy; CSME clinically significant macular edema; DME diabetic macular edema; dbh dot blot hemorrhages; CWS cotton wool spot; POAG primary open angle glaucoma; C/D cup-to-disc ratio; HVF humphrey visual field; GVF goldmann visual field; OCT optical coherence tomography; IOP intraocular pressure; BRVO Branch retinal vein occlusion; CRVO central retinal vein occlusion; CRAO central retinal artery occlusion; BRAO branch retinal artery occlusion; RT retinal tear; SB scleral buckle; PPV pars plana vitrectomy; VH Vitreous hemorrhage; PRP panretinal laser photocoagulation; IVK intravitreal kenalog ; VMT vitreomacular traction; MH Macular hole;  NVD neovascularization of the disc; NVE neovascularization elsewhere; AREDS age related eye disease study; ARMD age related macular degeneration; POAG primary open angle glaucoma; EBMD epithelial/anterior basement membrane dystrophy; ACIOL anterior chamber intraocular lens;  IOL intraocular lens; PCIOL posterior chamber intraocular lens; Phaco/IOL phacoemulsification with intraocular lens placement; PRK photorefractive keratectomy; LASIK laser assisted in situ keratomileusis; HTN hypertension; DM diabetes mellitus; COPD chronic obstructive pulmonary disease

## 2024-09-19 ENCOUNTER — Encounter: Payer: Self-pay | Admitting: Nurse Practitioner

## 2024-09-19 ENCOUNTER — Ambulatory Visit: Payer: Self-pay

## 2024-09-19 ENCOUNTER — Other Ambulatory Visit: Payer: Self-pay

## 2024-09-19 ENCOUNTER — Encounter (INDEPENDENT_AMBULATORY_CARE_PROVIDER_SITE_OTHER): Payer: Self-pay

## 2024-09-19 VITALS — BP 120/70 | HR 62 | Temp 98.1°F | Wt 141.8 lb

## 2024-09-19 DIAGNOSIS — E039 Hypothyroidism, unspecified: Secondary | ICD-10-CM | POA: Insufficient documentation

## 2024-09-19 DIAGNOSIS — E559 Vitamin D deficiency, unspecified: Secondary | ICD-10-CM | POA: Insufficient documentation

## 2024-09-19 DIAGNOSIS — M810 Age-related osteoporosis without current pathological fracture: Secondary | ICD-10-CM | POA: Insufficient documentation

## 2024-09-19 MED ORDER — LEVOTHYROXINE 75 MCG TABLET
ORAL_TABLET | ORAL | 3 refills | Status: AC
Start: 2024-09-19 — End: ?

## 2024-09-19 NOTE — Assessment & Plan Note (Signed)
 Risk factors osteoporosis including advancing age, gender, vitamin D  deficiency  T score -2.6 at Left hip ; DXA 11/2023  Other secondary causes ruled out including hyperparathyroidism and multiple myeloma.       - Discussed the diagnosis of osteoporosis and available treatments including anti-resorptive therapy and anabolic therapy. I also discussed the rare risk of ONJ and atypical femoral fractures associated with anti-resorptive therapy.    - Recommend continuing vitamin D  intake of 1000 units per day and a total of 1000 mg/day of calcium, including both dietary sources and supplements.     Advised to see dentist and see if there any procedures that need to be done prior to starting an anti-resorptive agent and inform them of plan to start anti-resorptive agent.    Encouraged weight-bearing, balance and muscle strengthening exercises.     Discussed fall precautions.    - at this time patient would like to continue lifestyle changes with exercise only  - discussed no conclusive evidence regarding exercise programs with improvement in BMD or reduction of fracture risk   - would benefit from fosmax or reclast  DXA due 11/2025

## 2024-09-19 NOTE — Assessment & Plan Note (Addendum)
 Chronic  Clinically and biochemically euthyroid  On levothyroxine  75mcg 1 tablet 6 days a week    - given age and symptoms goal TSH; mid- high normal   -  TSH within range, continue levothyroxine  75mcg 1 tablet 6 days a week  - recheck in 6 and 12 months to ensure stability

## 2024-09-19 NOTE — Patient Instructions (Addendum)
 Continue levothyroxine  75mcg 1 tablet 6 days a week.     Thyroid  hormone should be taken daily on an empty stomach 30-60 minutes before breakfast and separated from supplements such as multivitamins, calcium, and iron by at least four hours.     Antacids, such as Nexium, Prilosec, Protonix, should be taken at least one hour apart from levothyroxine  and caffeine should be avoided for 30-60 minutes following levothyroxine .    Aim to get at least 1000 mg calcium intake daily in diet.       Continue current vitamin D  therapy.       If taking biotin or multivitamin, please hold 4 days prior to lab work.     - repeat lab work in 6 and 12 months , 1 week prior to next office visit     - will be due for repeat DXA scan 12/08/2025

## 2024-09-19 NOTE — Assessment & Plan Note (Signed)
 Within goal 71ng/mL    - discussed goal >30ng/mL  - continue bone restore supplement with total 1000 units

## 2024-09-19 NOTE — Progress Notes (Signed)
 ENDOCRINOLOGY, [facility]  [facility]  [address]  [place] [address]  Operated by [facility]    Endocrinology Follow up Patient Visit      NAME: Kristin Kennedy DOS: 09/19/2024   MRN: [id number] DOB: 01-03-1951     Reason for Visit: Hypothyroidism (F/U)    LOV 03/14/2024    PCP - Kathrine Rush, MD    Subjective: Kristin Kennedy is a 73 y.o. female with PMH of Hypothyroidism, Osteoporosis, vitamin D  deficiency for follow up.     Overall doing well. Denies any specific questions or concerns.   Continues following with Osteo strong team in [place]. Was surprised to know how much heavier weight she should be exercising with.    -------  Current med: Levothyroxine  75mcg 1 tablet 6 days a week, taking it appropriately on an empty stomach, waiting an hour to eat or drink anything.  Not missing doses.     Denies palpitations, tremors, diarrhea.   Menstrual cycles; s/p hysterectomy     Current calcium and Vit D intake:  Dietary sources: Milk; 1 glasses a day. Cheese: 0 servings a week, Yogurt: 0 servings a week.   Life extension bone restore; 4 tablets daily   supplements: Ca: in bone restore  Vitamin D : in bone restore   Multivitamins: 0 tabs     No known family history of thyroid  disease, thyroid  cancer, diabetes.     Used to work as social worker.     Denies history of tobacco use. Denies alcohol intake.     The following portions of the patient's history were reviewed and updated as appropriate: allergies, current medications, past family history, past medical history, past social history, past surgical history and problem list.     Past Medical History:   Diagnosis Date    BCC (basal cell carcinoma), eyelid     Cervical dysplasia     Hypothyroidism     Miscarriage     Osteoporosis     Thalassemia minor          Past Surgical History:   Procedure Laterality Date    BASAL CELL CARCINOMA EXCISION      EYELID    HX BREAST BIOPSY Left     benign    HX COLPOSCOPY      HX CRYOSURGERY OF CERVIX      HX  HYSTERECTOMY      TVH WO BSO    OTHER SURGICAL HISTORY      FX OF FEMUR     Family Medical History:       Problem Relation (Age of Onset)    Breast Cancer Paternal Aunt    Diabetes Paternal Grandfather    High Cholesterol Father    Hypertension (High Blood Pressure) Mother    Melanoma Paternal Grandfather             Social History     Socioeconomic History    Marital status: Married   Tobacco Use    Smoking status: Never    Smokeless tobacco: Never   Vaping Use    Vaping status: Never Used   Substance and Sexual Activity    Alcohol use: Never    Drug use: Never    Sexual activity: Yes     Partners: Male     Birth control/protection: Surgical     Social Determinants of Health     Transportation Needs: Low Risk (11/14/2022)    Received from [facility])  Transportation     Has a lack of transportation kept you from medical appointments, meetings, work, or from getting things needed for daily living?: Not on file       Allergies:  No Known Allergies    Medications:  Current Outpatient Medications   Medication Instructions    ascorbic acid (VITAMIN C) 1,000 mg, Daily    BIOTIN ORAL Daily    cholecalciferol (Vitamin D3) 5,000 Units    ferrous sulfate (FERATAB) 324 mg, EVERY OTHER DAY    levothyroxine  (SYNTHROID) 75 mcg, Oral, SEE COMMENT, Take 1 tablet 6 days a week, skip Sundays.    Magnesium 200 mg, Daily    omega 3-dha-epa-fish oil 1,000 mg (250 mg-750 mg)/5 mL Oral Liquid 1,000 mg    UNKNOWN MEDICATION (UNKNOWN MEDICATION) BONE RESTORE WITH CALCIUM. 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 All negative save for those detailed in HPI.      Objective:  BP 120/70 (Site: Left Arm, Patient Position: Sitting)   Pulse 62   Temp 36.7 C (98.1 F)   Wt 64.3 kg (141 lb 12.8 oz)   SpO2 97%   BMI 25.12 kg/m       Wt Readings from Last 3 Encounters:   09/19/24 64.3 kg (141 lb 12.8 oz)   08/14/24 65.3 kg (144 lb)   03/14/24 64.5 kg (142 lb 3.2 oz)      Patient examined today, changes as noted below.    GENERAL APPEARANCE: alert, cooperative, oriented, and in no acute distress.    HEENT: Head Normocephalic, Atraumatic.    EYES: conjunctivae/corneas clear. PERRL   NECK: Neck supple. No cervical adenopathy.  No palpable thyroid  nodules.  CHEST AND LUNGS: Clear to auscultation bilaterally  CARDIOVASCULAR: Regular rate and rhythm, no murmurs  ABDOMEN: Soft, non-tender.   EXTREMITIES: No edema in lower extremities b/l.    SKIN: normal color and texture  NEUROLOGIC: Gait normal  PSYCH: Normal mood and affect    Labs:    No results found for: HA1C  Lab Results   Component Value Date    TSH 0.866 09/13/2024    TSH 0.866 09/13/2024    FREET4 1.30 09/13/2024     Lab Results   Component Value Date    WBC 4.9 09/13/2024    HGB 12.8 09/13/2024    HCT 40.2 09/13/2024    MCV 67.5 (L) 09/13/2024    MCH 21.4 (L) 09/13/2024     Lab Results   Component Value Date    CO2 28 09/13/2024    BUN 13 09/13/2024    CREATININE 0.87 09/13/2024    CALCIUM 9.6 09/13/2024     No components found for: DRISCILLA GRAYER  Lab Results   Component Value Date    ALT 13 09/13/2024    AST 21 09/13/2024    ALKPHOS 89 09/13/2024    ALBUMIN 4.1 09/13/2024     Lab Results   Component Value Date    CHOLESTEROL 217 (H) 09/13/2024  HDLCHOL 50 09/13/2024    LDLCHOL 138 (H) 09/13/2024    TRIG 144 09/13/2024     No results found for: MICALBCRERAT  No results found for: TSI  Lab Results   Component Value Date    CALCIUM 9.6 09/13/2024       Imaging:    Dxa 11/2023      Dxa 2021      Cytology/Pathology:      Assessment & Plan:    Hypothyroidism  Chronic  Clinically and biochemically euthyroid  On  levothyroxine  75mcg 1 tablet 6 days a week    - given age and symptoms goal TSH; mid- high normal   -  TSH within range, continue levothyroxine  75mcg 1 tablet 6 days a week  - recheck in 6 and 12 months to ensure stability       Vitamin D  deficiency  Within goal 71ng/mL    - discussed goal >30ng/mL  - continue bone restore supplement with total 1000 units        Osteoporosis  Risk factors osteoporosis including advancing age, gender, vitamin D  deficiency  T score -2.6 at Left hip ; DXA 11/2023  Other secondary causes ruled out including hyperparathyroidism and multiple myeloma.     - Discussed the diagnosis of osteoporosis and available treatments including anti-resorptive therapy and anabolic therapy. I also discussed the rare risk of ONJ and atypical femoral fractures associated with anti-resorptive therapy.    - Recommend continuing vitamin D  intake of 1000 units per day and a total of 1000 mg/day of calcium, including both dietary sources and supplements.     Advised to see dentist and see if there any procedures that need to be done prior to starting an anti-resorptive agent and inform them of plan to start anti-resorptive agent.    Encouraged weight-bearing, balance and muscle strengthening exercises.     Discussed fall precautions.    - at this time patient would like to continue lifestyle changes with exercise only  - discussed no conclusive evidence regarding exercise programs with improvement in BMD or reduction of fracture risk   - would benefit from fosmax or reclast  DXA due 11/2025        Follow up in 1 year      All questions answered. Patient expressed understanding and agreement.   I appreciate the opportunity to participate in this patient's care.    Dr. Tawni Doll  Staff Endocrinologist   Medicine Endocrinology, Diabetes & Metabolism    [facility]  [address]   [place] [address]  P: [phone]  F: [phone]    [facility]  [address]  [place], [place] [address]    On the day of the encounter, a total of 20 minutes was spent on this patient encounter including review of historical information, examination, documentation and post-visit activities. The time documented excludes procedural time.    Electronically signed by Tawni Doll, MD    09/19/24

## 2024-09-25 ENCOUNTER — Ambulatory Visit: Admitting: Medical

## 2024-09-25 ENCOUNTER — Ambulatory Visit: Payer: Self-pay

## 2024-09-25 VITALS — BP 120/84 | HR 84 | Temp 97.8°F | Wt 167.4 lb

## 2024-09-25 DIAGNOSIS — R21 Rash and other nonspecific skin eruption: Secondary | ICD-10-CM

## 2024-09-25 MED ORDER — TRIAMCINOLONE ACETONIDE 0.1 % EX CREA: 1.0000 | TOPICAL_CREAM | Freq: Two times a day (BID) | CUTANEOUS | 0 refills | Status: AC

## 2024-09-25 NOTE — Progress Notes (Signed)
   Name: Courtney Barry   Date of Visit: 09/25/24   CHIEF COMPLAINT:  Chief Complaint  Patient presents with   Acute Visit    Insect bite, 2 spots then a big ring rash around it. Itchy, swelling, warm to touch. Noticed it Sunday during church       HPI:  Discussed the use of AI scribe software for clinical note transcription with the patient, who gave verbal consent to proceed.  History of Present Illness   Courtney Barry is a 73 year old female who presents with husband for a rash following a suspected insect bite.  She developed a rash on the back of her right leg that began on Sunday2 days ago around 11:38 AM. The rash is quite itchy, and can become hot to the touch after rubbing. 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  She has been using Benadryl cream and cold compresses to manage the symptoms, which provide temporary relief. She also started taking generic Benadryl tablets at night to aid sleep, which has been effective.  No fluid-filled blisters have been noted, and she has not observed any insects that might have bitten her. She has not used any public toilets recently and has not been to the [facility] in recent days.   No blisters, body aches, chills, or other systemic symptoms.      No other aggravating or relieving factors. No other complaint.    OBJECTIVE:    BP 120/84   Pulse 84   Temp 97.8 F (36.6 C)   Wt 167 lb 6.4 oz (75.9 kg)   SpO2 98%   BMI 28.73 kg/m    General appearence: alert, no distress, WD/WN,  Skin: Right posterior thigh with 2 patches of erythema urticarial appearing approximately 1.4 cm diameter adjacent to each other, no blisters or vesicles, no induration, no warmth, no crusting, no other rash noted     ASSESSMENT/PLAN:   Encounter Diagnosis  Name Primary?   Rash Yes    Localized skin rash, likely insect bite  Rash on back of  leg likely due to insect bite. Viral rash less likely due to absence of blisters. Contact dermatitis unlikely without new irritant exposure. No signs of poisonous spider bite. Flea bites unlikely due to rash characteristics. - Prescribed triamcinolone  cream twice daily for 4-5 days. - Continue oral Benadryl for itching, up to 3-4 times daily, caution for drowsiness. - Advised cold compresses for symptom relief. - Instructed on hand hygiene and glove use when applying cream. - Advised against swimming until rash resolves. -if worse, new changes or other then call or recheck  [name] was seen today for acute visit.  Diagnoses and all orders for this visit:  Rash  Other orders -     triamcinolone  cream (KENALOG ) 0.1 %; Apply 1 Application topically 2 (two) times daily.     [facility]

## 2024-09-25 NOTE — Telephone Encounter (Signed)
 FYI Only or Action Required?: FYI only for provider: appointment scheduled on This morning.  Patient was last seen in primary care on 05/16/2024 by Courtney Alm RAMAN, PA-C.  Called Nurse Triage reporting Insect Bite - red, swollen warm, & itchy  Symptoms began Sunday.  Interventions attempted: OTC medications: Hydrocortisone, Benadryl.  Symptoms are: gradually improving.  Triage Disposition: See PCP When Office is Open (Within 3 Days)  Patient/caregiver understands and will follow disposition?: Yes                 Reason for Disposition  [1] SEVERE local itching (e.g., interferes with work, school, sleep) AND [2] not improved after 24 hours of hydrocortisone cream  Answer Assessment - Initial Assessment Questions 1. TYPE of INSECT: What type of insect was it?      unsure 2. ONSET: When did you get bitten?      Sunday 3. LOCATION: Where is the insect bite located?      Behind right thigh 4. REDNESS: Is the area red or pink? If Yes, ask: What size is the area of redness? (inches or cm). When did the redness start?     Redness, swelling, warmth - size of a quarter and another the size of a penny 5. PAIN: Is there any pain? If Yes, ask: How bad is the pain? (Scale 0-10; or none, mild, moderate, severe)     No pain 6. ITCHING: Does it itch? If Yes, ask: How bad is the itch?      Yes 8. OTHER SYMPTOMS: Do you have any other symptoms?  (e.g., difficulty breathing, fever, hives)     no  Protocols used: Insect Bite-A-AH  Source  Courtney Barry (Patient)   Subject  Courtney Barry, Courtney Barry (Patient)   Topic  Clinical - Red Word Triage    Communication  Red Word that prompted transfer to Nurse Triage: Patient called and stated she has a bite on the back of her right leg, complaining of redness, swelling, and itching.

## 2024-10-02 ENCOUNTER — Encounter (INDEPENDENT_AMBULATORY_CARE_PROVIDER_SITE_OTHER): Payer: Self-pay | Admitting: Ophthalmology

## 2024-10-02 ENCOUNTER — Ambulatory Visit (INDEPENDENT_AMBULATORY_CARE_PROVIDER_SITE_OTHER): Admitting: Ophthalmology

## 2024-10-02 DIAGNOSIS — Z7985 Long-term (current) use of injectable non-insulin antidiabetic drugs: Secondary | ICD-10-CM | POA: Diagnosis not present

## 2024-10-02 DIAGNOSIS — H33311 Horseshoe tear of retina without detachment, right eye: Secondary | ICD-10-CM | POA: Diagnosis not present

## 2024-10-02 DIAGNOSIS — H43813 Vitreous degeneration, bilateral: Secondary | ICD-10-CM | POA: Diagnosis not present

## 2024-10-02 DIAGNOSIS — I10 Essential (primary) hypertension: Secondary | ICD-10-CM | POA: Diagnosis not present

## 2024-10-02 DIAGNOSIS — E119 Type 2 diabetes mellitus without complications: Secondary | ICD-10-CM

## 2024-10-02 DIAGNOSIS — H35033 Hypertensive retinopathy, bilateral: Secondary | ICD-10-CM

## 2024-10-02 DIAGNOSIS — Z794 Long term (current) use of insulin: Secondary | ICD-10-CM

## 2024-10-02 DIAGNOSIS — H25813 Combined forms of age-related cataract, bilateral: Secondary | ICD-10-CM | POA: Diagnosis not present

## 2024-10-07 ENCOUNTER — Other Ambulatory Visit: Payer: Self-pay | Admitting: Medical

## 2024-10-28 ENCOUNTER — Other Ambulatory Visit: Payer: Self-pay | Admitting: Nurse Practitioner

## 2024-10-28 ENCOUNTER — Other Ambulatory Visit: Payer: Self-pay | Admitting: Medical

## 2024-10-28 DIAGNOSIS — E1169 Type 2 diabetes mellitus with other specified complication: Secondary | ICD-10-CM

## 2024-10-29 NOTE — Telephone Encounter (Signed)
 Pt has an appt in March 2026

## 2024-12-10 ENCOUNTER — Telehealth: Payer: Self-pay | Admitting: Nurse Practitioner

## 2024-12-10 NOTE — Telephone Encounter (Signed)
 Please reschedule patient to after our appt due to snow

## 2024-12-11 ENCOUNTER — Ambulatory Visit: Admitting: Nurse Practitioner

## 2024-12-11 ENCOUNTER — Ambulatory Visit: Admitting: Nutrition

## 2024-12-20 ENCOUNTER — Encounter: Admitting: Nutrition

## 2024-12-20 ENCOUNTER — Ambulatory Visit: Admitting: Nurse Practitioner

## 2024-12-20 ENCOUNTER — Encounter: Payer: Self-pay | Admitting: Nurse Practitioner

## 2024-12-20 VITALS — BP 102/60 | HR 92 | Ht 64.0 in | Wt 167.8 lb

## 2024-12-20 DIAGNOSIS — E559 Vitamin D deficiency, unspecified: Secondary | ICD-10-CM | POA: Diagnosis not present

## 2024-12-20 DIAGNOSIS — Z794 Long term (current) use of insulin: Secondary | ICD-10-CM

## 2024-12-20 DIAGNOSIS — E039 Hypothyroidism, unspecified: Secondary | ICD-10-CM | POA: Diagnosis not present

## 2024-12-20 DIAGNOSIS — Z7985 Long-term (current) use of injectable non-insulin antidiabetic drugs: Secondary | ICD-10-CM | POA: Diagnosis not present

## 2024-12-20 DIAGNOSIS — E1165 Type 2 diabetes mellitus with hyperglycemia: Secondary | ICD-10-CM | POA: Diagnosis not present

## 2024-12-20 DIAGNOSIS — I10 Essential (primary) hypertension: Secondary | ICD-10-CM

## 2024-12-20 DIAGNOSIS — E782 Mixed hyperlipidemia: Secondary | ICD-10-CM

## 2024-12-20 LAB — POCT GLYCOSYLATED HEMOGLOBIN (HGB A1C): Hemoglobin A1C: 8.7 % — AB (ref 4.0–5.6)

## 2024-12-20 MED ORDER — TIRZEPATIDE 7.5 MG/0.5ML ~~LOC~~ SOAJ: 7.5000 mg | SUBCUTANEOUS | 1 refills | Status: AC

## 2024-12-20 NOTE — Progress Notes (Signed)
 "                                                                               12/20/2024, 10:08 AM                            Endocrinology follow-up note  Subjective:    Patient ID: Courtney Barry, female    DOB: December 30, 1950.  Courtney Barry is being seen in follow-up for management of currently uncontrolled symptomatic diabetes requested by  Bulah Alm RAMAN, PA-C.   Past Medical History:  Diagnosis Date   Arthritis    generalized- RIGHT hip   Diabetes mellitus, type II (HCC)    Encounter for specialized medical examination 09/04/2013   Hyperlipidemia    on meds   Hypertension    on meds   Hypothyroidism    on meds   Menopausal vaginal dryness    Normal gynecologic examination 09/05/2014   Seasonal allergies    Vaccine counseling 05/21/2021    Past Surgical History:  Procedure Laterality Date   ABDOMINAL HYSTERECTOMY     APPENDECTOMY     CHOLECYSTECTOMY     COLONOSCOPY  2014   Dr.Richardson-hems/tics/abnormal rectal exam-good prep   COLONOSCOPY  06/2023   Dr. Lynnie Bring, diverticulosis and polpys, repeat 7 years   TONSILLECTOMY      Social History   Socioeconomic History   Marital status: Married    Spouse name: Not on file   Number of children: Not on file   Years of education: Not on file   Highest education level: Not on file  Occupational History   Not on file  Tobacco Use   Smoking status: Never   Smokeless tobacco: Never  Vaping Use   Vaping status: Never Used  Substance and Sexual Activity   Alcohol use: Never   Drug use: Never   Sexual activity: Yes    Partners: Male    Comment: 1st intercourse- 66, partners- 1, married- 44 yrs,  Other Topics Concern   Not on file  Social History Narrative   Lives at home with husband .  Was prior teacher and counselor, taught business courses in high school.  Exercise - silver sneakers.  Hobbies - golf some.  01/2024.   Social Drivers of Health   Tobacco Use: Low Risk (12/20/2024)   Patient  History    Smoking Tobacco Use: Never    Smokeless Tobacco Use: Never    Passive Exposure: Not on file  Financial Resource Strain: Low Risk (01/18/2024)   Overall Financial Resource Strain (CARDIA)    Difficulty of Paying Living Expenses: Not hard at all  Food Insecurity: No Food Insecurity (01/18/2024)   Hunger Vital Sign    Worried About Running Out of Food in the Last Year: Never true    Ran Out of Food in the Last Year: Never true  Transportation Needs: No Transportation Needs (01/18/2024)   PRAPARE - Administrator, Civil Service (Medical): No    Lack of Transportation (Non-Medical): No  Physical Activity: Sufficiently Active (01/18/2024)   Exercise Vital Sign    Days of Exercise per Week: 2  days    Minutes of Exercise per Session: 90 min  Stress: No Stress Concern Present (01/18/2024)   [employer] of Occupational Health - Occupational Stress Questionnaire    Feeling of Stress : Not at all  Social Connections: Socially Integrated (01/18/2024)   Social Connection and Isolation Panel    Frequency of Communication with Friends and Family: Once a week    Frequency of Social Gatherings with Friends and Family: Three times a week    Attends Religious Services: More than 4 times per year    Active Member of Clubs or Organizations: Yes    Attends Banker Meetings: More than 4 times per year    Marital Status: Married  Depression (PHQ2-9): Low Risk (01/18/2024)   Depression (PHQ2-9)    PHQ-2 Score: 0  Alcohol Screen: Not on file  Housing: Low Risk (01/18/2024)   Housing Stability Vital Sign    Unable to Pay for Housing in the Last Year: No    Number of Times Moved in the Last Year: 0    Homeless in the Last Year: No  Utilities: Not At Risk (01/18/2024)   AHC Utilities    Threatened with loss of utilities: No  Health Literacy: Not on file    Family History  Problem Relation Age of Onset   Colon polyps Mother 51   Thyroid  disease Mother    Cancer Mother     Pancreatic cancer Mother 17   Hypertension Father    Kidney disease Father    Thyroid  cancer Sister        died at 81   Colon cancer Neg Hx    Esophageal cancer Neg Hx    Stomach cancer Neg Hx    Rectal cancer Neg Hx     Outpatient Encounter Medications as of 12/20/2024  Medication Sig   ACCU-CHEK GUIDE TEST test strip USE TO CHECK BLOOD GLUCOSE TWICE DAILY   Accu-Chek Softclix Lancets lancets USE AS INSTRUCTED TO MONITOR GLUCOSE TWICE DAILY   atorvastatin  (LIPITOR) 40 MG tablet TAKE 1 TABLET BY MOUTH EVERY DAY   Blood Glucose Monitoring Suppl (ACCU-CHEK GUIDE ME) w/Device KIT Use as directed to check blood glucose twice daily before breakfast and bed. DX E11.65   Cholecalciferol (VITAMIN D ) 125 MCG (5000 UT) CAPS Take 1 capsule by mouth daily.   Insulin  Pen Needle (BD PEN NEEDLE NANO U/F) 32G X 4 MM MISC 1 Units by Does not apply route daily at 2 PM.   loratadine (CLARITIN) 10 MG tablet Take 10 mg by mouth daily as needed for allergies, rhinitis or itching.   losartan  (COZAAR ) 25 MG tablet TAKE 1 TABLET (25 MG TOTAL) BY MOUTH DAILY.   Multiple Vitamin (MULTIVITAMIN) tablet Take 1 tablet by mouth daily.   SYNTHROID  112 MCG tablet Take 1 tablet (112 mcg total) by mouth daily.   tirzepatide  (MOUNJARO ) 7.5 MG/0.5ML Pen Inject 7.5 mg into the skin once a week.   TRESIBA  FLEXTOUCH 100 UNIT/ML FlexTouch Pen Inject 28 Units into the skin at bedtime.   triamcinolone  cream (KENALOG ) 0.1 % Apply 1 Application topically 2 (two) times daily.   [DISCONTINUED] Semaglutide , 2 MG/DOSE, (OZEMPIC , 2 MG/DOSE,) 8 MG/3ML SOPN Inject 2 mg into the skin once a week.   No facility-administered encounter medications on file as of 12/20/2024.    ALLERGIES: Allergies  Allergen Reactions   Latex     Gloves, band aids- causes rash   Sulfa Antibiotics Rash    VACCINATION STATUS: Immunization History  Administered Date(s) Administered   Fluad Quad(high Dose 65+) 09/10/2020, 08/14/2021, 08/03/2022   Fluad  Trivalent(High Dose 65+) 08/29/2023   INFLUENZA, HIGH DOSE SEASONAL PF 08/20/2024   Influenza-Unspecified 08/09/2019   PFIZER Comirnaty(Gray Top)Covid-19 Tri-Sucrose Vaccine 05/28/2021   PFIZER(Purple Top)SARS-COV-2 Vaccination 12/31/2019, 01/02/2020, 08/25/2020   Pneumococcal Conjugate-13 11/24/2020   Pneumococcal Polysaccharide-23 01/19/2022    Diabetes She presents for her follow-up diabetic visit. She has type 2 diabetes mellitus. Onset time: She was diagnosed at approximate age of 47 years. Her disease course has been stable. There are no hypoglycemic associated symptoms. Pertinent negatives for hypoglycemia include no confusion, nervousness/anxiousness, pallor, seizures or tremors. Pertinent negatives for diabetes include no fatigue, no polydipsia, no polyphagia, no polyuria and no weight loss. There are no hypoglycemic complications. Symptoms are stable. Diabetic complications include nephropathy. Risk factors for coronary artery disease include diabetes mellitus, dyslipidemia, hypertension, post-menopausal, sedentary lifestyle and obesity. Current diabetic treatment includes insulin  injections (and Ozempic ). She is compliant with treatment most of the time. Her weight is fluctuating minimally. She is following a generally healthy diet. When asked about meal planning, she reported none. She has not had a previous visit with a dietitian. She participates in exercise intermittently. Her home blood glucose trend is fluctuating minimally. Her breakfast blood glucose range is generally 140-180 mg/dl. Her bedtime blood glucose range is generally 140-180 mg/dl. (She presents today with her meter and logs showing mostly at goal glycemic profile overall.  Her POCT A1c today is 8.7%, increasing from last visit of 7.6%.  She is shocked by this because she has eaten well and her glucose readings are virtually the same.  She does note she has not been as active over the last several months (no longer doing water  aerobics).  Analysis of her meter shows 7-day average of 153, 14-day average of 154, 30-day average of 159, 90-day average of 160.  She denies any hypoglycemia.  ) An ACE inhibitor/angiotensin II receptor blocker is being taken. She does not see a podiatrist.Eye exam is current.  Thyroid  Problem Presents for follow-up visit. Patient reports no anxiety, cold intolerance, constipation, depressed mood, diarrhea, fatigue, heat intolerance, leg swelling, tremors, weight gain or weight loss. The symptoms have been stable.    Review of systems  Constitutional: + Minimally fluctuating body weight,  current Body mass index is 28.8 kg/m. , no fatigue, no subjective hyperthermia, no subjective hypothermia Eyes: no blurry vision, no xerophthalmia ENT: no sore throat, no nodules palpated in throat, no dysphagia/odynophagia, no hoarseness Cardiovascular: no chest pain, no shortness of breath, no palpitations, no leg swelling Respiratory: no cough, no shortness of breath Gastrointestinal: no nausea/vomiting/diarrhea, + mild intermittent constipation Musculoskeletal: no muscle/joint aches Skin: no rashes, no hyperemia Neurological: no tremors, no numbness, no tingling, no dizziness Psychiatric: no depression, no anxiety   Objective:    BP 102/60 (BP Location: Left Arm, Patient Position: Sitting, Cuff Size: Large)   Pulse 92   Ht 5' 4 (1.626 m)   Wt 167 lb 12.8 oz (76.1 kg)   BMI 28.80 kg/m   Wt Readings from Last 3 Encounters:  12/20/24 167 lb 12.8 oz (76.1 kg)  09/25/24 167 lb 6.4 oz (75.9 kg)  08/07/24 165 lb (74.8 kg)    BP Readings from Last 3 Encounters:  12/20/24 102/60  09/25/24 120/84  08/07/24 120/64    Physical Exam- Limited  Constitutional:  Body mass index is 28.8 kg/m. , not in acute distress, normal state of mind Eyes:  EOMI, no exophthalmos  Musculoskeletal: no gross deformities, strength intact in all four extremities, no gross restriction of joint movements Skin:  no  rashes, no hyperemia Neurological: no tremor with outstretched hands   Diabetic Foot Exam - Simple   No data filed      Recent Results (from the past 2160 hours)  HgB A1c     Status: Abnormal   Collection Time: 12/20/24  8:39 AM  Result Value Ref Range   Hemoglobin A1C 8.7 (A) 4.0 - 5.6 %   HbA1c POC (<> result, manual entry)     HbA1c, POC (prediabetic range)     HbA1c, POC (controlled diabetic range)           Assessment & Plan:   1) Uncontrolled type 2 diabetes mellitus with hyperglycemia (HCC)  - Courtney Barry has currently controlled asymptomatic type 2 DM since  74 years of age.  She presents today with her meter and logs showing mostly at goal glycemic profile overall.  Her POCT A1c today is 8.7%, increasing from last visit of 7.6%.  She is shocked by this because she has eaten well and her glucose readings are virtually the same.  She does note she has not been as active over the last several months (no longer doing water aerobics).  Analysis of her meter shows 7-day average of 153, 14-day average of 154, 30-day average of 159, 90-day average of 160.  She denies any hypoglycemia.    -her diabetes is complicated by retinopathy obesity/sedentary life and she remains at a high risk for more acute and chronic complications which include CAD, CVA, CKD, retinopathy, and neuropathy. These are all discussed in detail with her.  - Nutritional counseling repeated/built upon at each appointment.  - The patient admits there is a room for improvement in their diet and drink choices. -  Suggestion is made for the patient to avoid simple carbohydrates from their diet including Cakes, Sweet Desserts / Pastries, Ice Cream, Soda (diet and regular), Sweet Tea, Candies, Chips, Cookies, Sweet Pastries, Store Bought Juices, Alcohol in Excess of 1-2 drinks a day, Artificial Sweeteners, Coffee Creamer, and Sugar-free Products. This will help patient to have stable blood glucose profile  and potentially avoid unintended weight gain.   - I encouraged the patient to switch to unprocessed or minimally processed complex starch and increased protein intake (animal or plant source), fruits, and vegetables.   - Patient is advised to stick to a routine mealtimes to eat 3 meals a day and avoid unnecessary snacks (to snack only to correct hypoglycemia).  - I have approached her with the following individualized plan to manage  her diabetes and patient agrees:   -She will continue to benefit from simplified treatment regimen, given her tight control of diabetes.    -She is advised to continue her Tresiba  28 units SQ nightly.  She can use up her remaining supply of Ozempic  2 mg SQ weekly and then will transition to GIP therapy with Mounjaro  at 7.5 mg SQ weekly which may provide more potent effects and allow her to reduce her insulin  burden.  -She is encouraged to routinely fingerstick twice daily, before breakfast and before bed, and to call the clinic if she has readings less than 70 or above 300 for 3 tests in a row.  She did not tolerate CGM (adhesive allergy).  - She has allergy to sulfa medications, therefore not a candidate for glipizide.  She did have significant diarrhea with Metformin , therefore will avoid that as well.  -  Specific targets for  A1c;  LDL, HDL, Triglycerides,  were discussed with the patient.  2) Blood Pressure /Hypertension:  Her blood pressure is controlled to target.  She is advised to continue meds as prescribed by her PCP.  3) Lipids/Hyperlipidemia:  Her most recent lipid panel from 01/18/24 shows controlled LDL at 56.  She is advised to continue Lipitor 40 mg po daily at bedtime.  Side effects and precautions discussed with her.    4) Hypothyroidism- longstanding diagnosis since at least from her 82s.  There are no recent TFTs to review.   She is advised to continue Synthroid  112 mcg po daily before breakfast.  She has appt with PCP for annual physical and  labs.  I did not order follow up labs this time in anticipation of him checking it.  I asked her to reach out if he does not check thyroid  levels.    - We discussed about the correct intake of her thyroid  hormone, on empty stomach at fasting, with water, separated by at least 30 minutes from breakfast and other medications,  and separated by more than 4 hours from calcium , iron, multivitamins, acid reflux medications (PPIs). -Patient is made aware of the fact that thyroid  hormone replacement is needed for life, dose to be adjusted by periodic monitoring of thyroid  function tests.  5)  Weight/Diet:  Her Body mass index is 28.8 kg/m.-   she is a candidate for some weight loss. I discussed with her the fact that loss of 5 - 10% of her  current body weight will have the most impact on her diabetes management.  Exercise, and detailed carbohydrates information provided  -  detailed on discharge instructions.  6) Chronic Care/Health Maintenance: -she is on ACEI/ARB and Statin medications and is encouraged to initiate and continue to follow up with Ophthalmology, Dentist, Podiatrist at least yearly or according to recommendations, and advised to stay away from smoking. I have recommended yearly flu vaccine and pneumonia vaccine at least every 5 years; moderate intensity exercise for up to 150 minutes weekly; and sleep for at least 7 hours a day.  - she is advised to maintain close follow up with Tysinger, Alm RAMAN, PA-C for primary care needs, as well as her other providers for optimal and coordinated care.     I spent  43  minutes in the care of the patient today including review of labs from CMP, Lipids, Thyroid  Function, Hematology (current and previous including abstractions from other facilities); face-to-face time discussing  her blood glucose readings/logs, discussing hypoglycemia and hyperglycemia episodes and symptoms, medications doses, her options of short and long term treatment based on the  latest standards of care / guidelines;  discussion about incorporating lifestyle medicine;  and documenting the encounter. Risk reduction counseling performed per USPSTF guidelines to reduce obesity and cardiovascular risk factors.     Please refer to Patient Instructions for Blood Glucose Monitoring and Insulin /Medications Dosing Guide  in media tab for additional information. Please  also refer to  Patient Self Inventory in the Media  tab for reviewed elements of pertinent patient history.  Courtney Barry participated in the discussions, expressed understanding, and voiced agreement with the above plans.  All questions were answered to her satisfaction. she is encouraged to contact clinic should she have any questions or concerns prior to her return visit.   Follow up plan: - Return in about 4 months (around 04/19/2025) for Diabetes F/U with A1c in office, Thyroid  follow up, Bring  meter and logs.   Benton Rio, [facility] [address] [place], [place] [address] Phone: [phone] Fax: [phone]  12/20/2024, 10:08 AM    "

## 2024-12-20 NOTE — Patient Instructions (Signed)

## 2024-12-27 ENCOUNTER — Encounter: Admitting: Nutrition

## 2024-12-27 ENCOUNTER — Ambulatory Visit (INDEPENDENT_AMBULATORY_CARE_PROVIDER_SITE_OTHER): Payer: Self-pay | Admitting: Medical

## 2025-01-09 ENCOUNTER — Encounter (INDEPENDENT_AMBULATORY_CARE_PROVIDER_SITE_OTHER): Payer: Self-pay

## 2025-01-28 ENCOUNTER — Ambulatory Visit: Payer: Self-pay | Admitting: Medical

## 2025-01-29 ENCOUNTER — Encounter (INDEPENDENT_AMBULATORY_CARE_PROVIDER_SITE_OTHER): Admitting: Ophthalmology

## 2025-02-12 ENCOUNTER — Ambulatory Visit (INDEPENDENT_AMBULATORY_CARE_PROVIDER_SITE_OTHER): Payer: Self-pay

## 2025-04-18 ENCOUNTER — Encounter: Admitting: Nutrition

## 2025-04-18 ENCOUNTER — Ambulatory Visit: Admitting: Nurse Practitioner

## 2025-04-19 ENCOUNTER — Ambulatory Visit: Admitting: Nurse Practitioner

## 2025-09-23 ENCOUNTER — Ambulatory Visit (INDEPENDENT_AMBULATORY_CARE_PROVIDER_SITE_OTHER): Payer: Self-pay
